# Patient Record
Sex: Female | Born: 1935 | Race: White | Hispanic: No | Marital: Married | Smoking: Never smoker
Health system: Southern US, Community
[De-identification: ages and names within clinical notes are randomized; demographics above are authoritative.]

## PROBLEM LIST (undated history)

## (undated) DIAGNOSIS — M1990 Unspecified osteoarthritis, unspecified site: Secondary | ICD-10-CM

## (undated) DIAGNOSIS — M25561 Pain in right knee: Secondary | ICD-10-CM

## (undated) DIAGNOSIS — G8929 Other chronic pain: Secondary | ICD-10-CM

## (undated) DIAGNOSIS — H3530 Unspecified macular degeneration: Secondary | ICD-10-CM

## (undated) DIAGNOSIS — R269 Unspecified abnormalities of gait and mobility: Secondary | ICD-10-CM

## (undated) DIAGNOSIS — I10 Essential (primary) hypertension: Secondary | ICD-10-CM

## (undated) DIAGNOSIS — M549 Dorsalgia, unspecified: Secondary | ICD-10-CM

## (undated) DIAGNOSIS — E079 Disorder of thyroid, unspecified: Secondary | ICD-10-CM

## (undated) HISTORY — DX: Unspecified osteoarthritis, unspecified site: M19.90

## (undated) HISTORY — PX: COLONOSCOPY: SHX174

## (undated) HISTORY — DX: Unspecified macular degeneration: H35.30

## (undated) HISTORY — PX: SHOULDER SURGERY: SHX246

## (undated) HISTORY — PX: ABDOMINAL HYSTERECTOMY: SHX81

---

## 2001-04-19 ENCOUNTER — Ambulatory Visit (HOSPITAL_COMMUNITY): Admission: RE | Admit: 2001-04-19 | Discharge: 2001-04-19 | Payer: Self-pay | Admitting: Ophthalmology

## 2001-04-19 ENCOUNTER — Encounter: Payer: Self-pay | Admitting: Ophthalmology

## 2001-05-25 ENCOUNTER — Encounter: Payer: Self-pay | Admitting: Emergency Medicine

## 2001-05-25 ENCOUNTER — Emergency Department (HOSPITAL_COMMUNITY): Admission: EM | Admit: 2001-05-25 | Discharge: 2001-05-25 | Payer: Self-pay | Admitting: Emergency Medicine

## 2001-06-22 ENCOUNTER — Encounter (HOSPITAL_COMMUNITY): Admission: RE | Admit: 2001-06-22 | Discharge: 2001-07-22 | Payer: Self-pay | Admitting: Orthopedic Surgery

## 2001-07-23 ENCOUNTER — Encounter (HOSPITAL_COMMUNITY): Admission: RE | Admit: 2001-07-23 | Discharge: 2001-08-22 | Payer: Self-pay | Admitting: Orthopedic Surgery

## 2002-03-30 ENCOUNTER — Ambulatory Visit (HOSPITAL_COMMUNITY): Admission: RE | Admit: 2002-03-30 | Discharge: 2002-03-30 | Payer: Self-pay | Admitting: Gastroenterology

## 2002-03-30 ENCOUNTER — Encounter (INDEPENDENT_AMBULATORY_CARE_PROVIDER_SITE_OTHER): Payer: Self-pay | Admitting: Specialist

## 2002-10-19 ENCOUNTER — Emergency Department (HOSPITAL_COMMUNITY): Admission: EM | Admit: 2002-10-19 | Discharge: 2002-10-19 | Payer: Self-pay | Admitting: *Deleted

## 2002-10-20 ENCOUNTER — Ambulatory Visit (HOSPITAL_COMMUNITY): Admission: RE | Admit: 2002-10-20 | Discharge: 2002-10-20 | Payer: Self-pay | Admitting: *Deleted

## 2002-10-20 ENCOUNTER — Encounter: Payer: Self-pay | Admitting: *Deleted

## 2005-02-12 ENCOUNTER — Ambulatory Visit (HOSPITAL_COMMUNITY): Admission: RE | Admit: 2005-02-12 | Discharge: 2005-02-12 | Payer: Self-pay | Admitting: Ophthalmology

## 2006-08-14 ENCOUNTER — Emergency Department (HOSPITAL_COMMUNITY): Admission: EM | Admit: 2006-08-14 | Discharge: 2006-08-14 | Payer: Self-pay | Admitting: Emergency Medicine

## 2006-11-25 ENCOUNTER — Ambulatory Visit (HOSPITAL_COMMUNITY): Admission: RE | Admit: 2006-11-25 | Discharge: 2006-11-25 | Payer: Self-pay | Admitting: Ophthalmology

## 2010-08-03 NOTE — Op Note (Signed)
NAME:  Cynthia Marks, Cynthia Marks                      ACCOUNT NO.:  [id number]   MEDICAL RECORD NO.:  [id number]                   PATIENT TYPE:  AMB   LOCATION:  ENDO                                 FACILITY:  [facility]   PHYSICIAN:  Petra Kuba, M.D.                 DATE OF BIRTH:  03/06/1933   DATE OF PROCEDURE:  03/30/2002  DATE OF DISCHARGE:                                 OPERATIVE REPORT   PROCEDURE:  Colonoscopy.   INDICATION:  A patient with a history of colon polyps, due for repeat  screening.   Consent was signed after risks, benefits and options, survey discussed in  the past.   MEDICINES USED:  Demerol 120 mg, Versed 12 mg.   DESCRIPTION OF PROCEDURE:  Rectal inspection was pertinent for external  hemorrhoids, small.  Digital exam was negative.  The video pediatric  adjustable colonoscope was inserted with lots of difficulty with a tortuous  sigmoid which required rolling on her back and some abdominal pressure to  advance pass.  Once passed, the instrument was advanced fairly easily to the  cecum.  Other than left and right diverticula, there was no other  abnormality seen.  The cecum was identified by the appendiceal orifice and  the ileocecal valve.  The scope was slowly withdrawn.  The prep was  adequate, there was some liquid stool that required washing and suctioning.  On slow withdrawal, other than the diverticula mentioned above, two tiny  polyps were seen, one in the hepatic flexure hot biopsied and one in the  distal sigmoid cold biopsied and put in separate containers.  No other  abnormalities were seen.  The instrument was withdrawn back to the rectum  and on inspecting the rectum the scope was retroflexed and pertinent for  some internal hemorrhoids.  The scope was straightened and readvanced  towards the left side of the colon, the air was suctioned and the scope  removed.  The patient tolerated the procedure adequately, there were no  obvious or immediate  complications.   ENDOSCOPIC DIAGNOSES:  1. Internal and external hemorrhoids.  2. Left and right diverticula.  3. Tortuous sigmoid.  4. Acute tiny to small polyps, cold biopsied in the distal sigmoid, hot     biopsied in the hepatic flexure.  5. Otherwise within normal limits to the cecum.    PLAN:  Await pathology and probably recheck colon for screening in five  years, happy to see back p.r.n., otherwise return to the care of Drs. Miguel  and Altheimer for customary healthcare maintenance to include yearly rectals  and guaiacs.  In the future, if available, virtual colonoscopy might be a  good option in her based on tortuosity.  Petra Kuba, M.D.    MEM/MEDQ  D:  03/30/2002  T:  03/30/2002  Job:  [id number]   cc:   Veverly Fells. Altheimer, M.D.  [address]  [place]  [place] [address]  Fax: [phone]   Dr. Coletta Memos, [place]

## 2010-12-28 LAB — BASIC METABOLIC PANEL
BUN: 24 — ABNORMAL HIGH
CO2: 26
Calcium: 10.5
Chloride: 108
Creatinine, Ser: 1.05
GFR calc Af Amer: >60
GFR calc non Af Amer: 52 — ABNORMAL LOW
Glucose, Bld: 95
Potassium: 4.5
Sodium: 139

## 2010-12-28 LAB — HEMOGLOBIN AND HEMATOCRIT, BLOOD
HCT: 37.4
Hemoglobin: 12.7

## 2011-04-02 DIAGNOSIS — H35059 Retinal neovascularization, unspecified, unspecified eye: Secondary | ICD-10-CM | POA: Diagnosis not present

## 2011-04-02 DIAGNOSIS — H353290 Exudative age-related macular degeneration, unspecified eye, stage unspecified: Secondary | ICD-10-CM | POA: Diagnosis not present

## 2011-04-03 DIAGNOSIS — E1149 Type 2 diabetes mellitus with other diabetic neurological complication: Secondary | ICD-10-CM | POA: Diagnosis not present

## 2011-04-03 DIAGNOSIS — E119 Type 2 diabetes mellitus without complications: Secondary | ICD-10-CM | POA: Diagnosis not present

## 2011-04-16 DIAGNOSIS — E119 Type 2 diabetes mellitus without complications: Secondary | ICD-10-CM | POA: Diagnosis not present

## 2011-04-16 DIAGNOSIS — E7800 Pure hypercholesterolemia, unspecified: Secondary | ICD-10-CM | POA: Diagnosis not present

## 2011-04-16 DIAGNOSIS — E039 Hypothyroidism, unspecified: Secondary | ICD-10-CM | POA: Diagnosis not present

## 2011-04-16 DIAGNOSIS — E559 Vitamin D deficiency, unspecified: Secondary | ICD-10-CM | POA: Diagnosis not present

## 2011-04-23 DIAGNOSIS — E559 Vitamin D deficiency, unspecified: Secondary | ICD-10-CM | POA: Diagnosis not present

## 2011-04-23 DIAGNOSIS — E7800 Pure hypercholesterolemia, unspecified: Secondary | ICD-10-CM | POA: Diagnosis not present

## 2011-04-23 DIAGNOSIS — E119 Type 2 diabetes mellitus without complications: Secondary | ICD-10-CM | POA: Diagnosis not present

## 2011-04-23 DIAGNOSIS — M949 Disorder of cartilage, unspecified: Secondary | ICD-10-CM | POA: Diagnosis not present

## 2011-04-23 DIAGNOSIS — E213 Hyperparathyroidism, unspecified: Secondary | ICD-10-CM | POA: Diagnosis not present

## 2011-04-23 DIAGNOSIS — I10 Essential (primary) hypertension: Secondary | ICD-10-CM | POA: Diagnosis not present

## 2011-04-23 DIAGNOSIS — E039 Hypothyroidism, unspecified: Secondary | ICD-10-CM | POA: Diagnosis not present

## 2011-06-12 DIAGNOSIS — E119 Type 2 diabetes mellitus without complications: Secondary | ICD-10-CM | POA: Diagnosis not present

## 2011-06-12 DIAGNOSIS — E1149 Type 2 diabetes mellitus with other diabetic neurological complication: Secondary | ICD-10-CM | POA: Diagnosis not present

## 2011-07-09 DIAGNOSIS — H35059 Retinal neovascularization, unspecified, unspecified eye: Secondary | ICD-10-CM | POA: Diagnosis not present

## 2011-07-09 DIAGNOSIS — H353290 Exudative age-related macular degeneration, unspecified eye, stage unspecified: Secondary | ICD-10-CM | POA: Diagnosis not present

## 2011-07-24 DIAGNOSIS — E039 Hypothyroidism, unspecified: Secondary | ICD-10-CM | POA: Diagnosis not present

## 2011-07-24 DIAGNOSIS — E559 Vitamin D deficiency, unspecified: Secondary | ICD-10-CM | POA: Diagnosis not present

## 2011-07-24 DIAGNOSIS — E119 Type 2 diabetes mellitus without complications: Secondary | ICD-10-CM | POA: Diagnosis not present

## 2011-07-24 DIAGNOSIS — E7800 Pure hypercholesterolemia, unspecified: Secondary | ICD-10-CM | POA: Diagnosis not present

## 2011-07-31 DIAGNOSIS — E213 Hyperparathyroidism, unspecified: Secondary | ICD-10-CM | POA: Diagnosis not present

## 2011-07-31 DIAGNOSIS — M899 Disorder of bone, unspecified: Secondary | ICD-10-CM | POA: Diagnosis not present

## 2011-07-31 DIAGNOSIS — I10 Essential (primary) hypertension: Secondary | ICD-10-CM | POA: Diagnosis not present

## 2011-07-31 DIAGNOSIS — E559 Vitamin D deficiency, unspecified: Secondary | ICD-10-CM | POA: Diagnosis not present

## 2011-07-31 DIAGNOSIS — E039 Hypothyroidism, unspecified: Secondary | ICD-10-CM | POA: Diagnosis not present

## 2011-07-31 DIAGNOSIS — E119 Type 2 diabetes mellitus without complications: Secondary | ICD-10-CM | POA: Diagnosis not present

## 2011-07-31 DIAGNOSIS — E7800 Pure hypercholesterolemia, unspecified: Secondary | ICD-10-CM | POA: Diagnosis not present

## 2011-08-28 DIAGNOSIS — E1149 Type 2 diabetes mellitus with other diabetic neurological complication: Secondary | ICD-10-CM | POA: Diagnosis not present

## 2011-08-28 DIAGNOSIS — E119 Type 2 diabetes mellitus without complications: Secondary | ICD-10-CM | POA: Diagnosis not present

## 2011-10-09 DIAGNOSIS — H353290 Exudative age-related macular degeneration, unspecified eye, stage unspecified: Secondary | ICD-10-CM | POA: Diagnosis not present

## 2011-10-09 DIAGNOSIS — H35059 Retinal neovascularization, unspecified, unspecified eye: Secondary | ICD-10-CM | POA: Diagnosis not present

## 2011-10-21 DIAGNOSIS — IMO0002 Reserved for concepts with insufficient information to code with codable children: Secondary | ICD-10-CM | POA: Diagnosis not present

## 2011-10-21 DIAGNOSIS — M25469 Effusion, unspecified knee: Secondary | ICD-10-CM | POA: Diagnosis not present

## 2011-10-28 DIAGNOSIS — S83419A Sprain of medial collateral ligament of unspecified knee, initial encounter: Secondary | ICD-10-CM | POA: Diagnosis not present

## 2011-10-29 DIAGNOSIS — H353190 Nonexudative age-related macular degeneration, unspecified eye, stage unspecified: Secondary | ICD-10-CM | POA: Diagnosis not present

## 2011-10-29 DIAGNOSIS — Z961 Presence of intraocular lens: Secondary | ICD-10-CM | POA: Diagnosis not present

## 2011-11-04 DIAGNOSIS — E039 Hypothyroidism, unspecified: Secondary | ICD-10-CM | POA: Diagnosis not present

## 2011-11-04 DIAGNOSIS — E119 Type 2 diabetes mellitus without complications: Secondary | ICD-10-CM | POA: Diagnosis not present

## 2011-11-04 DIAGNOSIS — E7800 Pure hypercholesterolemia, unspecified: Secondary | ICD-10-CM | POA: Diagnosis not present

## 2011-11-05 DIAGNOSIS — E119 Type 2 diabetes mellitus without complications: Secondary | ICD-10-CM | POA: Diagnosis not present

## 2011-11-05 DIAGNOSIS — I10 Essential (primary) hypertension: Secondary | ICD-10-CM | POA: Diagnosis not present

## 2011-11-05 DIAGNOSIS — E039 Hypothyroidism, unspecified: Secondary | ICD-10-CM | POA: Diagnosis not present

## 2011-11-05 DIAGNOSIS — E559 Vitamin D deficiency, unspecified: Secondary | ICD-10-CM | POA: Diagnosis not present

## 2011-11-05 DIAGNOSIS — M949 Disorder of cartilage, unspecified: Secondary | ICD-10-CM | POA: Diagnosis not present

## 2011-11-05 DIAGNOSIS — E7800 Pure hypercholesterolemia, unspecified: Secondary | ICD-10-CM | POA: Diagnosis not present

## 2011-11-05 DIAGNOSIS — E213 Hyperparathyroidism, unspecified: Secondary | ICD-10-CM | POA: Diagnosis not present

## 2011-11-11 DIAGNOSIS — M25569 Pain in unspecified knee: Secondary | ICD-10-CM | POA: Diagnosis not present

## 2011-11-13 DIAGNOSIS — E1149 Type 2 diabetes mellitus with other diabetic neurological complication: Secondary | ICD-10-CM | POA: Diagnosis not present

## 2011-11-13 DIAGNOSIS — E119 Type 2 diabetes mellitus without complications: Secondary | ICD-10-CM | POA: Diagnosis not present

## 2011-12-04 ENCOUNTER — Emergency Department (HOSPITAL_COMMUNITY)
Admission: EM | Admit: 2011-12-04 | Discharge: 2011-12-04 | Disposition: A | Discharge status: Home / Self Care | Payer: Medicare Other | Attending: Emergency Medicine | Admitting: Emergency Medicine

## 2011-12-04 ENCOUNTER — Emergency Department (HOSPITAL_COMMUNITY): Payer: Medicare Other

## 2011-12-04 ENCOUNTER — Encounter (HOSPITAL_COMMUNITY): Payer: Self-pay | Admitting: *Deleted

## 2011-12-04 DIAGNOSIS — I10 Essential (primary) hypertension: Secondary | ICD-10-CM | POA: Insufficient documentation

## 2011-12-04 DIAGNOSIS — W108XXA Fall (on) (from) other stairs and steps, initial encounter: Secondary | ICD-10-CM | POA: Insufficient documentation

## 2011-12-04 DIAGNOSIS — Y92009 Unspecified place in unspecified non-institutional (private) residence as the place of occurrence of the external cause: Secondary | ICD-10-CM | POA: Insufficient documentation

## 2011-12-04 DIAGNOSIS — E119 Type 2 diabetes mellitus without complications: Secondary | ICD-10-CM | POA: Diagnosis not present

## 2011-12-04 DIAGNOSIS — T1490XA Injury, unspecified, initial encounter: Secondary | ICD-10-CM | POA: Diagnosis not present

## 2011-12-04 DIAGNOSIS — G319 Degenerative disease of nervous system, unspecified: Secondary | ICD-10-CM | POA: Diagnosis not present

## 2011-12-04 DIAGNOSIS — S82843A Displaced bimalleolar fracture of unspecified lower leg, initial encounter for closed fracture: Secondary | ICD-10-CM | POA: Insufficient documentation

## 2011-12-04 DIAGNOSIS — M25569 Pain in unspecified knee: Secondary | ICD-10-CM | POA: Diagnosis not present

## 2011-12-04 DIAGNOSIS — W19XXXA Unspecified fall, initial encounter: Secondary | ICD-10-CM

## 2011-12-04 DIAGNOSIS — Z043 Encounter for examination and observation following other accident: Secondary | ICD-10-CM | POA: Diagnosis not present

## 2011-12-04 DIAGNOSIS — S0990XA Unspecified injury of head, initial encounter: Secondary | ICD-10-CM | POA: Insufficient documentation

## 2011-12-04 HISTORY — DX: Essential (primary) hypertension: I10

## 2011-12-04 HISTORY — DX: Disorder of thyroid, unspecified: E07.9

## 2011-12-04 MED ORDER — OXYCODONE-ACETAMINOPHEN 5-325 MG PO TABS
1.0000 | ORAL_TABLET | Freq: Once | ORAL | Status: AC
  Administered 2011-12-04: 1 via ORAL
  Filled 2011-12-04: qty 1

## 2011-12-04 MED ORDER — OXYCODONE-ACETAMINOPHEN 5-325 MG PO TABS: 1.0000 | ORAL_TABLET | Freq: Four times a day (QID) | ORAL | Status: DC | PRN

## 2011-12-04 NOTE — ED Notes (Signed)
Pt with fall, pt tripped down some steps and now has swelling to right lower leg, old right knee injury over a month which pt has seen her PCP for x 2, pt states that she hit her head on railing, denies LOC, abrasion noted to above right eyebrow

## 2011-12-04 NOTE — ED Provider Notes (Signed)
History  This chart was scribed for Gale Klar B. Bernette Mayers, MD by Shari Heritage. The patient was seen in room APA02/APA02. Patient's care was started at 1438.     CSN: [id number]  Arrival date & time 12/04/11  1356   First MD Initiated Contact with Patient 12/04/11 1438      Chief Complaint  Patient presents with  . Fall  . Leg Pain  . Head Injury    The history is provided by the patient. No language interpreter was used.    Cynthia Marks is a 77 y.o. female who presents to the Emergency Department complaining of a constant, moderate right lower leg and right ankle pain with associated swelling resulting from a fall that occurred 2-3 hours ago. Patient was trying to enter her house when she fell on her front steps. She says she hit her head on the railing. She denies LOC, neck pain or back pain.. Patient states that she has a recent history of knee pain after falling 5-6 weeks ago. She has seen her PCP for this pain 2x and pain has not been resolved. Patient uses a cane. Patient has a medical history of diabetes, thyroid disease and HTN.  PCP - Gerda Diss  Past Medical History  Diagnosis Date  . Diabetes mellitus   . Thyroid disease   . Hypertension     Past Surgical History  Procedure Date  . Abdominal hysterectomy     History reviewed. No pertinent family history.  History  Substance Use Topics  . Smoking status: Never Smoker   . Smokeless tobacco: Not on file  . Alcohol Use: No    OB History    Grav Para Term Preterm Abortions TAB SAB Ect Mult Living                  Review of Systems A complete 10 system review of systems was obtained and all systems are negative except as noted in the HPI and PMH.   Allergies  Ibuprofen and Penicillins  Home Medications  No current outpatient prescriptions on file.  BP 159/73  Pulse 68  Resp 18  SpO2 91%  Physical Exam  Nursing note and vitals reviewed. Constitutional: She is oriented to person, place, and time.  She appears well-developed and well-nourished.  HENT:  Head: Normocephalic.       Forehead abrasion   Eyes: EOM are normal. Pupils are equal, round, and reactive to light.  Neck: Normal range of motion. Neck supple.  Cardiovascular: Normal rate, normal heart sounds and intact distal pulses.   Pulmonary/Chest: Effort normal and breath sounds normal.  Abdominal: Bowel sounds are normal. She exhibits no distension. There is no tenderness.  Musculoskeletal: Normal range of motion. She exhibits edema (swelling of R knee and R ankle) and tenderness (tender to palpation of R knee and both R maleoli).  Neurological: She is alert and oriented to person, place, and time. She has normal strength. No cranial nerve deficit or sensory deficit.  Skin: Skin is warm and dry. No rash noted.  Psychiatric: She has a normal mood and affect.    ED Course  Procedures (including critical care time) DIAGNOSTIC STUDIES: Oxygen Saturation is 91% on room air, low by my interpretation.    COORDINATION OF CARE: 2:46pm- Patient informed of current plan for treatment and evaluation and agrees with plan at this time.    Labs Reviewed - No data to display  Dg Ankle Complete Right  12/04/2011  *RADIOLOGY REPORT*  Clinical  Data: Larey Seat.  Right ankle pain.  RIGHT ANKLE - COMPLETE 3+ VIEW  Comparison: None  Findings: There is a nondisplaced transverse fracture through the medial malleolus at the level of the ankle mortise.  There is also a nondisplaced oblique coursing fracture involving the distal fibula.  The pelvis is intact.  The subtalar joints are maintained. A moderate sized calcaneal heel spur is noted.  Significant surrounding soft tissue swelling noted.  IMPRESSION: Nondisplaced bimalleolar ankle fractures.   Original Report Authenticated By: P. Loralie Champagne, M.D.    Ct Head Wo Contrast  12/04/2011  *RADIOLOGY REPORT*  Clinical Data: Head injury.  Status post fall.  CT HEAD WITHOUT CONTRAST  Technique:   Contiguous axial images were obtained from the base of the skull through the vertex without contrast.  Comparison: None  Findings:  There is diffuse patchy low density throughout the subcortical and periventricular white matter consistent with chronic small vessel ischemic change.  There is prominence of the sulci and ventricles consistent with brain atrophy.  There is no evidence for acute brain infarct, hemorrhage or mass.  The paranasal sinuses are clear.  The mastoid air cells are clear.  The skull is intact.  IMPRESSION:  1.  No acute intracranial abnormalities. 2.  Small vessel ischemic change and brain atrophy.   Original Report Authenticated By: Rosealee Albee, M.D.    Dg Knee Complete 4 Views Right  12/04/2011  *RADIOLOGY REPORT*  Clinical Data: Larey Seat.  RIGHT KNEE - COMPLETE 4+ VIEW  Comparison: None  Findings: The joint spaces are maintained.  No acute fracture or osteochondral abnormality.  No definite joint effusion.  Anterior soft tissue swelling over the knee may be due to direct trauma.  IMPRESSION:  1.  No acute fracture. 2.  Anterior soft tissue swelling/hematoma.   Original Report Authenticated By: P. Loralie Champagne, M.D.      No diagnosis found.    MDM  Pt with bimalleolar ankle fracture, non displaced. Discussed with Dr. Romeo Apple who agrees with plan to place in cam walker. If she is able to get around well enough to be safe at home she will be discharged with outpatient followup. Otherwise plan to admit for physical therapy. Her fracture is non-operative.    6:01 PM Pt able to ambulate with some assistance, but family is still concerned about her ability to get around with a walker. The husband has gone home to get her walker and will return so that we can assess her ability to walk with this device.   Pt wants to go home. Does not want to be admitted. Advised to follow closely with PCP and Ortho.   Jairon Ripberger B. Bernette Mayers, MD 12/04/11 1815

## 2011-12-04 NOTE — ED Notes (Signed)
MD at bedside. 

## 2011-12-06 DIAGNOSIS — S82899A Other fracture of unspecified lower leg, initial encounter for closed fracture: Secondary | ICD-10-CM | POA: Diagnosis not present

## 2011-12-10 ENCOUNTER — Ambulatory Visit (INDEPENDENT_AMBULATORY_CARE_PROVIDER_SITE_OTHER): Payer: Medicare Other | Admitting: Orthopedic Surgery

## 2011-12-10 ENCOUNTER — Encounter: Payer: Self-pay | Admitting: Orthopedic Surgery

## 2011-12-10 VITALS — BP 100/60 | Ht 68.0 in | Wt 275.0 lb

## 2011-12-10 DIAGNOSIS — S82843A Displaced bimalleolar fracture of unspecified lower leg, initial encounter for closed fracture: Secondary | ICD-10-CM | POA: Diagnosis not present

## 2011-12-10 DIAGNOSIS — L918 Other hypertrophic disorders of the skin: Secondary | ICD-10-CM

## 2011-12-10 NOTE — Progress Notes (Signed)
  Subjective:    Patient ID: Cynthia Marks, female    DOB: [birth year], 78 y.o.   MRN: [id number] Referral from Dr. Lilyan Punt  Ankle Injury  The incident occurred 5 to 7 days ago (12/04/2011). The incident occurred at home. The injury mechanism was a fall. The pain is present in the right ankle and right knee. The pain is at a severity of 8/10. The pain has been constant since onset. Pertinent negatives include no inability to bear weight, loss of motion, loss of sensation, muscle weakness, numbness or tingling.      Review of Systems  Musculoskeletal: Positive for gait problem.  Neurological: Positive for weakness. Negative for tingling and numbness.  All other systems reviewed and are negative.       Objective:   Physical Exam  Constitutional: She appears well-developed and well-nourished. No distress.       Obesity  HENT:  Head: Normocephalic.  Eyes: Right eye exhibits no discharge. Left eye exhibits no discharge.  Neck: Neck supple.  Cardiovascular: Normal rate and intact distal pulses.   Musculoskeletal:       The right and left upper extremities have no evidence of malalignment, range of motion is normal without contracture. All joints reduced and stable with normal muscle tone and skin  The patient's left lower extremity is without deformity, range of motion is greater than 120 and knee flexion with a stable knee normal strength and normal skin.  Lymphadenopathy:    She has no cervical adenopathy.  Skin: She is not diaphoretic.       Right lower extremity has an anterior large ulceration 4 cm x 4 cm over the anterior mid portion of the distal leg which is a blister which has opened up and formed hypertrophic granulation tissue. There is no depth to the wound.  There is ecchymosis from the proximal point of two thirds of the leg down into the foot and ankle with medial and lateral malleolar tenderness less than expected suggesting the possibility of peripheral  neuropathy although denied by the patient  There is decreased ankle range of motion actively and 15-20 passively. The ankle is otherwise stable muscle tone is normal in the foot and ankle and leg  Psychiatric: She has a normal mood and affect. Her behavior is normal. Judgment and thought content normal.   BP 100/60  Ht 5\' 8"  (1.727 m)  Wt 275 lb (124.739 kg)  BMI 41.81 kg/m2        Assessment & Plan:  The x-ray shows a bimalleolar fracture of the right ankle nondisplaced  The patient was in a Cam Walker which caused an anterior blister. She was afraid to take the brace off although the pain was noted and the sliding in the boot was noted. She was told in the ER not to take it off and she followed those instructions.  We have dressed the wound. We've ordered wound care daily. We've asked her to come back in a week for x-rays and wound check.  She has been advised that this is a dangerous situation for diabetic and can't turn into a catastrophe.

## 2011-12-10 NOTE — Patient Instructions (Signed)
Please check your skin daily

## 2011-12-11 ENCOUNTER — Ambulatory Visit: Payer: Medicare Other | Admitting: Orthopedic Surgery

## 2011-12-11 DIAGNOSIS — S70919A Unspecified superficial injury of unspecified hip, initial encounter: Secondary | ICD-10-CM | POA: Diagnosis not present

## 2011-12-11 DIAGNOSIS — S8290XD Unspecified fracture of unspecified lower leg, subsequent encounter for closed fracture with routine healing: Secondary | ICD-10-CM | POA: Diagnosis not present

## 2011-12-11 DIAGNOSIS — I10 Essential (primary) hypertension: Secondary | ICD-10-CM | POA: Diagnosis not present

## 2011-12-11 DIAGNOSIS — M1990 Unspecified osteoarthritis, unspecified site: Secondary | ICD-10-CM | POA: Diagnosis not present

## 2011-12-11 DIAGNOSIS — Z9181 History of falling: Secondary | ICD-10-CM | POA: Diagnosis not present

## 2011-12-11 DIAGNOSIS — E119 Type 2 diabetes mellitus without complications: Secondary | ICD-10-CM | POA: Diagnosis not present

## 2011-12-12 ENCOUNTER — Telehealth: Payer: Self-pay | Admitting: *Deleted

## 2011-12-12 NOTE — Telephone Encounter (Signed)
Home health nurse advised

## 2011-12-12 NOTE — Telephone Encounter (Signed)
no

## 2011-12-17 ENCOUNTER — Ambulatory Visit (INDEPENDENT_AMBULATORY_CARE_PROVIDER_SITE_OTHER): Payer: Medicare Other | Admitting: Orthopedic Surgery

## 2011-12-17 ENCOUNTER — Ambulatory Visit (INDEPENDENT_AMBULATORY_CARE_PROVIDER_SITE_OTHER): Payer: Medicare Other

## 2011-12-17 ENCOUNTER — Encounter: Payer: Self-pay | Admitting: Orthopedic Surgery

## 2011-12-17 VITALS — BP 110/60 | Ht 68.0 in | Wt 275.0 lb

## 2011-12-17 DIAGNOSIS — S82891A Other fracture of right lower leg, initial encounter for closed fracture: Secondary | ICD-10-CM

## 2011-12-17 DIAGNOSIS — S82899A Other fracture of unspecified lower leg, initial encounter for closed fracture: Secondary | ICD-10-CM

## 2011-12-17 NOTE — Progress Notes (Signed)
Patient ID: Cynthia Marks, female   DOB: 1933/02/04, 78 y.o.   MRN: [id number] Chief Complaint  Patient presents with  . Follow-up    1 week wound check on right ankle with xray.    Nondisplaced bimalleolar ankle fracture with anterior distal tibial wound, secondary to blister formation.  Currently undergoing daily dressing changes with a home care nurse and comes back today for x-ray of the ankle and wound check.  The wound/blister has improved significantly.  We will continue the dressing changes. They can be changed every other day one Monday, Wednesday, Friday.  As far as the ankle fracture goes the xrays show a little bit of movement in the fracture and these films are better   So limit WB

## 2011-12-17 NOTE — Patient Instructions (Addendum)
Change dressing m, w, f   Limit weight bearing

## 2011-12-18 ENCOUNTER — Telehealth: Payer: Self-pay | Admitting: Orthopedic Surgery

## 2011-12-18 NOTE — Telephone Encounter (Signed)
Home health nurse has been advised and will call patient

## 2011-12-18 NOTE — Telephone Encounter (Signed)
Patient called, states is concerned that [facility] has not been there to see her today, Wed, 12/18/11.  Understood they were notified and to be coming out 3x per week now, Mon, Wed, and Friday.  Please advise - patient ph # [phone]. [facility] ph# is [phone].  She will also probably call them directly, she states, but please advise.

## 2011-12-24 ENCOUNTER — Ambulatory Visit (INDEPENDENT_AMBULATORY_CARE_PROVIDER_SITE_OTHER): Payer: Medicare Other | Admitting: Orthopedic Surgery

## 2011-12-24 ENCOUNTER — Ambulatory Visit: Payer: Medicare Other | Admitting: Orthopedic Surgery

## 2011-12-24 ENCOUNTER — Ambulatory Visit (INDEPENDENT_AMBULATORY_CARE_PROVIDER_SITE_OTHER): Payer: Medicare Other

## 2011-12-24 ENCOUNTER — Encounter: Payer: Self-pay | Admitting: Orthopedic Surgery

## 2011-12-24 VITALS — BP 120/64 | Ht 68.0 in | Wt 275.0 lb

## 2011-12-24 DIAGNOSIS — S82891A Other fracture of right lower leg, initial encounter for closed fracture: Secondary | ICD-10-CM

## 2011-12-24 DIAGNOSIS — S82899A Other fracture of unspecified lower leg, initial encounter for closed fracture: Secondary | ICD-10-CM

## 2011-12-24 NOTE — Patient Instructions (Signed)
Keep weight bearing to a minimum

## 2011-12-24 NOTE — Progress Notes (Signed)
Patient ID: Cynthia Marks, female   DOB: 1932/03/23, 78 y.o.   MRN: [id number] Chief Complaint  Patient presents with  . Follow-up    one week recheck and xray right ankle fracture, DOI 12/04/11    Ankle and wound check  X-rays show no further displacement of the fracture.  The anterior leg wound has epithelialized and is now just a little bit red and there are no signs of infection.  Continued dressing changes minimal weightbearing and x-ray in one week

## 2012-01-01 ENCOUNTER — Encounter: Payer: Self-pay | Admitting: Orthopedic Surgery

## 2012-01-01 ENCOUNTER — Ambulatory Visit (INDEPENDENT_AMBULATORY_CARE_PROVIDER_SITE_OTHER): Payer: Medicare Other

## 2012-01-01 ENCOUNTER — Ambulatory Visit (INDEPENDENT_AMBULATORY_CARE_PROVIDER_SITE_OTHER): Payer: Medicare Other | Admitting: Orthopedic Surgery

## 2012-01-01 VITALS — BP 112/60 | Ht 68.0 in | Wt 275.0 lb

## 2012-01-01 DIAGNOSIS — S82891A Other fracture of right lower leg, initial encounter for closed fracture: Secondary | ICD-10-CM

## 2012-01-01 DIAGNOSIS — S82899A Other fracture of unspecified lower leg, initial encounter for closed fracture: Secondary | ICD-10-CM

## 2012-01-01 NOTE — Patient Instructions (Signed)
Stop home care   Check skin daily   Continue limited weight bearing  Wear brace

## 2012-01-01 NOTE — Progress Notes (Signed)
Patient ID: Cynthia Marks, female   DOB: [birth year], 78 y.o.   MRN: [id number] Chief Complaint  Patient presents with  . Follow-up    recheck and xray right ankle fracture, DOI 12/04/11    Slightly displaced or minimally displaced bimalleolar ankle fracture, and a diabetic, who had an open wound on the tibia, treated with dressing changes and cam walker minimal weightbearing. Repeat x-ray today shows maintenance of the slight displacement.  Recommend continued minimal weightbearing. The wound has healed, so, we can stop the dressing changes continue cam walker. Followup 2 weeks repeat x-ray

## 2012-01-15 ENCOUNTER — Encounter: Payer: Self-pay | Admitting: Orthopedic Surgery

## 2012-01-15 ENCOUNTER — Ambulatory Visit (INDEPENDENT_AMBULATORY_CARE_PROVIDER_SITE_OTHER): Payer: Medicare Other

## 2012-01-15 ENCOUNTER — Ambulatory Visit (INDEPENDENT_AMBULATORY_CARE_PROVIDER_SITE_OTHER): Payer: Medicare Other | Admitting: Orthopedic Surgery

## 2012-01-15 VITALS — BP 120/80 | Ht 68.0 in | Wt 275.0 lb

## 2012-01-15 DIAGNOSIS — S82899A Other fracture of unspecified lower leg, initial encounter for closed fracture: Secondary | ICD-10-CM

## 2012-01-15 NOTE — Progress Notes (Signed)
Patient ID: Cynthia Marks, female   DOB: [birth year], 78 y.o.   MRN: [id number] Chief Complaint  Patient presents with  . Follow-up    follow up and xray right ankle, DOI 12/04/11    This is the 42nd day status post bimalleolar ankle fracture with a anterior tibial wound, which has been treated and is now closed.  X-rays show no change from the last x-ray, which shows displacement of the medial malleolus slight displacement of the lateral malleolus with a 2 mm lateral shift of the mortise,  We've accepted this deformity secondary to the wound and the diabetes.  She is in a cam, walker she's been partial weightbearing, and now can apply more weight on the RIGHT foot. She's been limited in terms of activity. She can now go out of the house apparently, ordered a wheelchair, so she's not on it too much, and she'll come back in 3 weeks for an x-ray of the RIGHT ankle.

## 2012-01-15 NOTE — Patient Instructions (Addendum)
Ok to apply weight on foot as long as brace is on   Remove for sleeping

## 2012-01-22 DIAGNOSIS — E119 Type 2 diabetes mellitus without complications: Secondary | ICD-10-CM | POA: Diagnosis not present

## 2012-01-22 DIAGNOSIS — E1149 Type 2 diabetes mellitus with other diabetic neurological complication: Secondary | ICD-10-CM | POA: Diagnosis not present

## 2012-01-23 DIAGNOSIS — E039 Hypothyroidism, unspecified: Secondary | ICD-10-CM | POA: Diagnosis not present

## 2012-01-23 DIAGNOSIS — E119 Type 2 diabetes mellitus without complications: Secondary | ICD-10-CM | POA: Diagnosis not present

## 2012-01-23 DIAGNOSIS — E785 Hyperlipidemia, unspecified: Secondary | ICD-10-CM | POA: Diagnosis not present

## 2012-01-23 DIAGNOSIS — Z23 Encounter for immunization: Secondary | ICD-10-CM | POA: Diagnosis not present

## 2012-01-23 DIAGNOSIS — I10 Essential (primary) hypertension: Secondary | ICD-10-CM | POA: Diagnosis not present

## 2012-02-05 ENCOUNTER — Ambulatory Visit (INDEPENDENT_AMBULATORY_CARE_PROVIDER_SITE_OTHER): Payer: Medicare Other

## 2012-02-05 ENCOUNTER — Encounter: Payer: Self-pay | Admitting: Orthopedic Surgery

## 2012-02-05 ENCOUNTER — Ambulatory Visit (INDEPENDENT_AMBULATORY_CARE_PROVIDER_SITE_OTHER): Payer: Medicare Other | Admitting: Orthopedic Surgery

## 2012-02-05 VITALS — Ht 68.0 in | Wt 275.0 lb

## 2012-02-05 DIAGNOSIS — S82899A Other fracture of unspecified lower leg, initial encounter for closed fracture: Secondary | ICD-10-CM | POA: Diagnosis not present

## 2012-02-05 DIAGNOSIS — S82843A Displaced bimalleolar fracture of unspecified lower leg, initial encounter for closed fracture: Secondary | ICD-10-CM

## 2012-02-05 NOTE — Patient Instructions (Signed)
Please schedule measurement and fitting AFO  Wear brace until AFO.  Call office once AFO has been received and schedule appointment for one week later

## 2012-02-05 NOTE — Progress Notes (Signed)
Patient ID: Cynthia Marks, female   DOB: 1933/01/03, 78 y.o.   MRN: [id number] Chief Complaint  Patient presents with  . Follow-up    9.18.2013, ankle fracture f/u     1. Ankle fracture  DG Ankle Complete Right, PT orthosis to lower extremity  2. Ankle fracture, bimalleolar, closed      Bimalleolar ankle fracture followup.  Currently in a cam walker.  Repeat x-rays show no subluxation has occurred. No change of position has occurred.  Slight lateral translation of the fibula and ankle mortise with a fibrous union of the medial malleolus.  Recommend AFO brace.  Return one week after brace received 4. X-ray

## 2012-02-06 ENCOUNTER — Telehealth: Payer: Self-pay | Admitting: Orthopedic Surgery

## 2012-02-06 NOTE — Telephone Encounter (Signed)
i already gave her the prescription hand written ??

## 2012-02-06 NOTE — Telephone Encounter (Signed)
Contacted [facility], ph# [phone], and fax# [phone] regarding AFO brace for patient, per office visit 02/05/12, as [facility] does not provide this type of brace.  RX needed for patient to take to [facility] -- they prefer patient to call to schedule appiontment and fitting rather than our office faxing.  Please enter order for AFO brace.  Patient [phone] (Home)

## 2012-02-07 NOTE — Telephone Encounter (Signed)
Patient verified that she did not leave with a prescription or order for the brace, only for walker.   Rx / Order needed for AFO brace for [facility].

## 2012-02-19 ENCOUNTER — Telehealth: Payer: Self-pay | Admitting: Orthopedic Surgery

## 2012-02-19 NOTE — Telephone Encounter (Signed)
Dr. Romeo Apple provided another script - patient was called and will pick up script.

## 2012-02-25 NOTE — Telephone Encounter (Signed)
Patient came in and picked up order for AFO brace, and has an appointment for custom-fitting at [facility] in [place], for 03/02/12, 11:00AM.  Patient is to return for re-check/Xray 1 week following receiving brace.

## 2012-03-16 ENCOUNTER — Ambulatory Visit (INDEPENDENT_AMBULATORY_CARE_PROVIDER_SITE_OTHER): Payer: Medicare Other

## 2012-03-16 ENCOUNTER — Encounter: Payer: Self-pay | Admitting: Orthopedic Surgery

## 2012-03-16 ENCOUNTER — Ambulatory Visit (INDEPENDENT_AMBULATORY_CARE_PROVIDER_SITE_OTHER): Payer: Medicare Other | Admitting: Orthopedic Surgery

## 2012-03-16 VITALS — BP 140/100 | Ht 68.0 in | Wt 275.0 lb

## 2012-03-16 DIAGNOSIS — S82843A Displaced bimalleolar fracture of unspecified lower leg, initial encounter for closed fracture: Secondary | ICD-10-CM | POA: Diagnosis not present

## 2012-03-16 DIAGNOSIS — S82899A Other fracture of unspecified lower leg, initial encounter for closed fracture: Secondary | ICD-10-CM | POA: Diagnosis not present

## 2012-03-16 NOTE — Patient Instructions (Addendum)
Stay in brace until new brace comes   Call the office for appointment 1 week after you get the brace

## 2012-03-16 NOTE — Progress Notes (Signed)
Patient ID: Cynthia Marks, female   DOB: October 21, 1932, 78 y.o.   MRN: [id number] Chief Complaint  Patient presents with  . Follow-up    recheck right ankle//9.18.2013    1. Ankle fracture  DG Ankle Complete Right  2. Ankle fracture, bimalleolar, closed      The AFO that was ordered has not come in yet. She still in a Manufacturing systems engineer.  X-ray was taken today. No significant change. There is some subluxation of the talus in the mortise,.  She's not having any pain. She is having some mild swelling.  Recommend AFO brace to control the ankle.  She will follow up after one week in the brace to check for any skin problems and to check for appropriate fit.

## 2012-03-17 DIAGNOSIS — E039 Hypothyroidism, unspecified: Secondary | ICD-10-CM | POA: Diagnosis not present

## 2012-03-17 DIAGNOSIS — E119 Type 2 diabetes mellitus without complications: Secondary | ICD-10-CM | POA: Diagnosis not present

## 2012-03-17 DIAGNOSIS — Z79899 Other long term (current) drug therapy: Secondary | ICD-10-CM | POA: Diagnosis not present

## 2012-03-17 DIAGNOSIS — E782 Mixed hyperlipidemia: Secondary | ICD-10-CM | POA: Diagnosis not present

## 2012-04-01 DIAGNOSIS — E1149 Type 2 diabetes mellitus with other diabetic neurological complication: Secondary | ICD-10-CM | POA: Diagnosis not present

## 2012-04-01 DIAGNOSIS — E119 Type 2 diabetes mellitus without complications: Secondary | ICD-10-CM | POA: Diagnosis not present

## 2012-04-15 ENCOUNTER — Ambulatory Visit: Payer: Medicare Other | Admitting: Orthopedic Surgery

## 2012-04-22 ENCOUNTER — Ambulatory Visit (INDEPENDENT_AMBULATORY_CARE_PROVIDER_SITE_OTHER): Payer: Medicare Other | Admitting: Orthopedic Surgery

## 2012-04-22 VITALS — BP 122/80 | Ht 68.0 in | Wt 275.0 lb

## 2012-04-22 DIAGNOSIS — S82843A Displaced bimalleolar fracture of unspecified lower leg, initial encounter for closed fracture: Secondary | ICD-10-CM

## 2012-04-22 DIAGNOSIS — I10 Essential (primary) hypertension: Secondary | ICD-10-CM | POA: Diagnosis not present

## 2012-04-22 DIAGNOSIS — R269 Unspecified abnormalities of gait and mobility: Secondary | ICD-10-CM

## 2012-04-22 DIAGNOSIS — E785 Hyperlipidemia, unspecified: Secondary | ICD-10-CM | POA: Diagnosis not present

## 2012-04-22 DIAGNOSIS — E119 Type 2 diabetes mellitus without complications: Secondary | ICD-10-CM | POA: Diagnosis not present

## 2012-04-22 NOTE — Patient Instructions (Addendum)
Please call the hospital to START PHYSICAL THERAPY

## 2012-04-22 NOTE — Progress Notes (Signed)
Patient ID: Cynthia Marks, female   DOB: [birth year], 79 y.o.   MRN: [id number] Chief Complaint  Patient presents with  . Follow-up    Recheck right ankle fracture after brace DOI 12/04/11    Sent for an ankle brace secondary to her diabetic peripheral neuropathy and bimalleolar ankle fracture with slight subluxation of the mortise  She is in a double upright brace she says her ankle feels great she's tolerating it well without any skin problems  Recommend physical therapy to try to transition her from a walker to a cane  Followup with [place] as needed therapy orders have been initiated

## 2012-04-29 ENCOUNTER — Ambulatory Visit (HOSPITAL_COMMUNITY)
Admission: RE | Admit: 2012-04-29 | Discharge: 2012-04-29 | Disposition: A | Discharge status: Home / Self Care | Payer: Medicare Other | Source: Ambulatory Visit | Attending: Orthopedic Surgery | Admitting: Orthopedic Surgery

## 2012-04-29 DIAGNOSIS — IMO0001 Reserved for inherently not codable concepts without codable children: Secondary | ICD-10-CM | POA: Diagnosis not present

## 2012-04-29 DIAGNOSIS — M6281 Muscle weakness (generalized): Secondary | ICD-10-CM | POA: Insufficient documentation

## 2012-04-29 DIAGNOSIS — R269 Unspecified abnormalities of gait and mobility: Secondary | ICD-10-CM | POA: Diagnosis not present

## 2012-04-29 DIAGNOSIS — M25579 Pain in unspecified ankle and joints of unspecified foot: Secondary | ICD-10-CM | POA: Insufficient documentation

## 2012-04-29 NOTE — Evaluation (Signed)
Physical Therapy Evaluation  Patient Details  Name: ELYN KROGH MRN: [id number] Date of Birth: 1932-12-02  Today's Date: 04/29/2012 Time: 1111-1200 PT Time Calculation (min): 49 min Charges: 1 eval Visit#: 1 of 8  Re-eval: 05/29/12 Assessment Diagnosis: R bimallelar fracture  Next MD Visit: Dr. Romeo Apple - unschedueled Prior Therapy: HHPT  Authorization: Medicare  Authorization Time Period:    Authorization Visit#: 1 of 10   Past Medical History:  Past Medical History  Diagnosis Date  . Diabetes mellitus   . Thyroid disease   . Hypertension    Past Surgical History:  Past Surgical History  Procedure Laterality Date  . Abdominal hysterectomy      Subjective Symptoms/Limitations Pertinent History: Pt is referred to PT for gait training from a bimalleolar fracture on 12/04/11.  She wore a CAM boot for 16 weeks and now presents with a double upright AFO, which was ordered through [employer].  She did not undergo surgery, she was was NWB for 12 weeks and was hoping with her walker.  She is now using a rollator for community mobility.  She wishes to be independent walking again.  Her c/co is pain to her R knee which hurts when she walks (5/10).  stifness and swelling to her ankle.  How long can you sit comfortably?: difficulty going from STS. How long can you stand comfortably?: 10 minutes How long can you walk comfortably?: w/RW 15-20 minutes Pain Assessment Currently in Pain?: Yes Pain Score:   5 Pain Location: Ankle (bimalleloar) Pain Type: Chronic pain Pain Relieving Factors: Tyelonal arthritis medication, ice, tramadol as needed Effect of Pain on Daily Activities: difficulty getting around community.   Prior Functioning  Home Living Lives With: Spouse  Cognition/Observation Observation/Other Assessments Observations: significant swelling to R ankle.   Sensation/Coordination/Flexibility/Functional Tests Functional Tests Functional Tests: 5 STS: 28 sec (from  lowered mat surface, 17.5 cm) Functional Tests: ABC: 47%  Assessment RLE AROM (degrees) RLE Overall AROM Comments: soleus 2 Right Ankle Dorsiflexion: -12 Right Ankle Plantar Flexion: 40 Right Ankle Inversion: 20 Right Ankle Eversion: 30 RLE PROM (degrees) Right Ankle Dorsiflexion: 0 Right Ankle Plantar Flexion: 40 Right Ankle Inversion: 28 Right Ankle Eversion: 30 RLE Strength Right Hip Flexion: 3-/5 Right Hip Extension: 2+/5 (L S/L) Right Hip ABduction: 2+/5 Right Knee Flexion: 4/5 Right Knee Extension: 3+/5 Right Ankle Dorsiflexion: 5/5 Right Ankle Plantar Flexion: 2+/5 Right Ankle Inversion: 3-/5 Right Ankle Eversion: 3-/5  Mobility/Balance  Ambulation/Gait Ambulation/Gait: Yes Ambulation Distance (Feet): 153 Feet Assistive device: Rollator Gait Pattern: Decreased stance time - right;Decreased hip/knee flexion - right (R toe out) Static Standing Balance Single Leg Stance - Right Leg: 0 Single Leg Stance - Left Leg: 0 Tandem Stance - Right Leg: 0 Tandem Stance - Left Leg: 0 Rhomberg - Eyes Opened: 10 Rhomberg - Eyes Closed: 10   Exercise/Treatments Supine Bridges: 5 reps Straight Leg Raises: Right;5 reps Sidelying Hip ABduction: Right;5 reps;AAROM  Physical Therapy Assessment and Plan PT Assessment and Plan Clinical Impression Statement: Pt is a 79 year old female referred to PT for bimalleloar fracture in September.  She comes in today with bilateral upright AFO to R ankle and has significant gait deviations likely causing knee pain and LE deviations. Pt will benefit from skilled therapeutic intervention in order to improve on the following deficits: Decreased balance;Decreased range of motion;Decreased strength;Difficulty walking;Pain;Increased edema;Impaired perceived functional ability Rehab Potential: Good PT Frequency: Min 2X/week PT Duration: 12 weeks PT Treatment/Interventions: Gait training;Stair training;Therapeutic activities;Therapeutic  exercise;Balance training;Neuromuscular re-education;Patient/family education;Manual  techniques;DME instruction PT Plan: Focus on proper gait mechanics, improving ankle strength (w/green t-band)    Goals Home Exercise Program Pt will Perform Home Exercise Program: Independently PT Goal: Perform Home Exercise Program - Progress: Goal set today PT Short Term Goals Time to Complete Short Term Goals: 4 weeks PT Short Term Goal 1: Pt will improve LE strength by 1 muscle grade to ambulate indoors w/SPC w/moderate gait abnormalities.  PT Short Term Goal 2: Pt will report pain to knee less than 3/10 for improved QOL.   PT Short Term Goal 3: Pt will improve static standing balance and demo tandem stance x10 sec on solid surface.  PT Short Term Goal 4: Pt will improve ankle AROM to [facility] in order to go from STS with appropriate mechanics.  PT Long Term Goals Time to Complete Long Term Goals: 12 weeks PT Long Term Goal 1: Pt will improve LE strength in order to ambulate independently in indoor and outdoor environment to improve QOL.  PT Long Term Goal 2: Pt will improve her ABC to 75% for improved percieved functional ability.  Long Term Goal 3: Pt will improve 2 minute walk test independently and complete 300 feet for improved safety with community ambulation.   Problem List Patient Active Problem List  Diagnosis  . Ankle fracture, bimalleolar, closed  . Skin breakdown  . Gait abnormality    PT Plan of Care PT Home Exercise Plan: see scanned report (green t-band given) PT Patient Instructions: importance of HEP Consulted and Agree with Plan of Care: Patient  GP Functional Assessment Tool Used: ABC: 47% Functional Limitation: Mobility: Walking and moving around Mobility: Walking and Moving Around Current Status ([id number]): At least 60 percent but less than 80 percent impaired, limited or restricted Mobility: Walking and Moving Around Goal Status [phone]): At least 20 percent but less than 40  percent impaired, limited or restricted  Kairyn Olmeda, PT 04/29/2012, 12:23 PM  Physician Documentation Your signature is required to indicate approval of the treatment plan as stated above.  Please sign and either send electronically or make a copy of this report for your files and return this physician signed original.   Please mark one 1.__approve of plan  2. ___approve of plan with the following conditions.   ______________________________                                                          _____________________ Physician Signature                                                                                                             Date

## 2012-05-05 ENCOUNTER — Ambulatory Visit (HOSPITAL_COMMUNITY)
Admission: RE | Admit: 2012-05-05 | Discharge: 2012-05-05 | Disposition: A | Discharge status: Home / Self Care | Payer: Medicare Other | Source: Ambulatory Visit | Attending: Orthopedic Surgery | Admitting: Orthopedic Surgery

## 2012-05-05 DIAGNOSIS — R269 Unspecified abnormalities of gait and mobility: Secondary | ICD-10-CM | POA: Diagnosis not present

## 2012-05-05 DIAGNOSIS — IMO0001 Reserved for inherently not codable concepts without codable children: Secondary | ICD-10-CM | POA: Diagnosis not present

## 2012-05-05 DIAGNOSIS — M6281 Muscle weakness (generalized): Secondary | ICD-10-CM | POA: Diagnosis not present

## 2012-05-05 DIAGNOSIS — M25579 Pain in unspecified ankle and joints of unspecified foot: Secondary | ICD-10-CM | POA: Diagnosis not present

## 2012-05-05 NOTE — Progress Notes (Signed)
Physical Therapy Treatment Patient Details  Name: Cynthia Marks MRN: [id number] Date of Birth: 1932-07-27  Today's Date: 05/05/2012 Time: 0932-1013 PT Time Calculation (min): 41 min  Visit#: 2 of 8  Re-eval: 05/29/12  Charge: Gait 10', therex 30'  Authorization: Medicare  Authorization Time Period:    Authorization Visit#: 2 of 10   Subjective: Symptoms/Limitations Symptoms: Pt stated compliance with HEP.  Pain free today. Pain Assessment Currently in Pain?: No/denies  Objective:   Exercise/Treatments Standing Heel Raises: 10 reps;Limitations Heel Raises Limitations: toe raises 10  Functional Squat: 10 reps Gait Training: Gait training w/ rollator SBA 436ft with cueing to normalize gait mechanics Other Standing Knee Exercises: hip ext/ abd 10x Seated Other Seated Knee Exercises: Hell roll out 10x 10' Other Seated Knee Exercises: 5STS w/ no HHA Supine Bridges: 10 reps Straight Leg Raises: 10 reps Other Supine Knee Exercises: ankle green theraband  10x 5" each direction- pt given HEP worksheet for HEP Sidelying Hip ABduction: 10 reps (cueing for form and current mm activation)  Physical Therapy Assessment and Plan PT Assessment and Plan Clinical Impression Statement: Improved gait mechanics following training for heel to toe pattern and equal stride length with rollator this session.  Pt instructed exercises for ankle strengthening all directions with theraband, pt given HEP worksheet for proper technqiue to continue strengthening at home. Added therex exercises for BLE strenghtening PT Plan: Focus on proper gait mechanics without AD next session.  Improve ankle and LE strengthening.    Goals    Problem List Patient Active Problem List  Diagnosis  . Ankle fracture, bimalleolar, closed  . Skin breakdown  . Gait abnormality    PT - End of Session Activity Tolerance: Patient tolerated treatment well General Behavior During Session: [facility] for tasks  performed Cognition: [facility] for tasks performed  GP    Juel Burrow 05/05/2012, 12:09 PM

## 2012-05-07 ENCOUNTER — Ambulatory Visit (HOSPITAL_COMMUNITY)
Admission: RE | Admit: 2012-05-07 | Discharge: 2012-05-07 | Disposition: A | Discharge status: Home / Self Care | Payer: Medicare Other | Source: Ambulatory Visit | Attending: Orthopedic Surgery | Admitting: Orthopedic Surgery

## 2012-05-07 DIAGNOSIS — R269 Unspecified abnormalities of gait and mobility: Secondary | ICD-10-CM | POA: Diagnosis not present

## 2012-05-07 DIAGNOSIS — IMO0001 Reserved for inherently not codable concepts without codable children: Secondary | ICD-10-CM | POA: Diagnosis not present

## 2012-05-07 DIAGNOSIS — M25579 Pain in unspecified ankle and joints of unspecified foot: Secondary | ICD-10-CM | POA: Diagnosis not present

## 2012-05-07 NOTE — Progress Notes (Signed)
Physical Therapy Treatment Patient Details  Name: Cynthia Marks MRN: [id number] Date of Birth: December 19, 1932  Today's Date: 05/07/2012 Time: 4098-1191 PT Time Calculation (min): 50 min  Visit#: 3 of 8  Re-eval: 05/29/12 Authorization: Medicare  Authorization Visit#: 3 of 10  Charges:  therex 44'  Subjective: Symptoms/Limitations Symptoms: PT. reports no pain today. Pain Assessment Currently in Pain?: No/denies   Exercise/Treatments Stretches ITB Stretch: 3 reps;30 seconds;Limitations ITB Stretch Limitations: PROM in supine R LE Standing Heel Raises: 15 reps Functional Squat: 15 reps Gait Training: Gait training 300' without AD and CGA with cueing to normalize gait mechanics Other Standing Knee Exercises: hip ext/ abd 15x Seated Other Seated Knee Exercises: towel inv/ever 5 reps R LE Other Seated Knee Exercises: 5STS w/ no HHA Supine Hip Adduction Isometric: 10 reps Bridges: 10 reps Straight Leg Raises: 10 reps Other Supine Knee Exercises: ankle green theraband  10x 5" each direction- pt given HEP worksheet for HEP Sidelying Hip ABduction: 10 reps     Physical Therapy Assessment and Plan PT Assessment and Plan Clinical Impression Statement: Pt. able to ambulate without AD without LOB or difficulty.  Noted slower gait speed, however little cues for stride length and gait pattern.  Added passive ITB stretch in supine due to tight hip rotators and iso hip adduction to strengthen hip adductors.  Pt able to complete session without difficulty.  Encouraged pt. to ambulate without AD more at home. PT Plan: Continue to work toward goals.  Progress balance activites (SLS) and steps.     Problem List Patient Active Problem List  Diagnosis  . Ankle fracture, bimalleolar, closed  . Skin breakdown  . Gait abnormality    PT - End of Session Activity Tolerance: Patient tolerated treatment well General Behavior During Session: [facility] for tasks performed Cognition: [facility] for  tasks performed   Lurena Nida, PTA/CLT 05/07/2012, 12:15 PM

## 2012-05-12 ENCOUNTER — Ambulatory Visit (HOSPITAL_COMMUNITY)
Admission: RE | Admit: 2012-05-12 | Discharge: 2012-05-12 | Disposition: A | Discharge status: Home / Self Care | Payer: Medicare Other | Source: Ambulatory Visit | Attending: Orthopedic Surgery | Admitting: Orthopedic Surgery

## 2012-05-12 DIAGNOSIS — R269 Unspecified abnormalities of gait and mobility: Secondary | ICD-10-CM | POA: Diagnosis not present

## 2012-05-12 DIAGNOSIS — M6281 Muscle weakness (generalized): Secondary | ICD-10-CM | POA: Diagnosis not present

## 2012-05-12 DIAGNOSIS — M25579 Pain in unspecified ankle and joints of unspecified foot: Secondary | ICD-10-CM | POA: Diagnosis not present

## 2012-05-12 DIAGNOSIS — IMO0001 Reserved for inherently not codable concepts without codable children: Secondary | ICD-10-CM | POA: Diagnosis not present

## 2012-05-12 NOTE — Progress Notes (Signed)
Physical Therapy Treatment Patient Details  Name: Cynthia Marks MRN: [id number] Date of Birth: [birth year]  Today's Date: 05/12/2012 Time: 1102-1200 PT Time Calculation (min): 58 min Charge: Gait 8', NMR 12', therex 28', ice 10x   Visit#: 4 of 8  Re-eval: 05/29/12 Assessment Diagnosis: R bimallelar fracture  Next MD Visit: Dr. Romeo Apple - unschedueled Prior Therapy: HHPT  Authorization: Medicare  Authorization Time Period:    Authorization Visit#: 4 of 10   Subjective: Symptoms/Limitations Symptoms: Pt reports pain free today Pain Assessment Currently in Pain?: No/denies  Objective:   Exercise/Treatments Standing Heel Raises: 15 reps Lateral Step Up: Right;5 reps;Hand Hold: 1;Step Height: 2";Step Height: 4" Forward Step Up: Right;10 reps;Hand Hold: 1;Step Height: 4" Functional Squat: 15 reps Rocker Board: 2 minutes;Limitations Rocker Board Limitations: R/L w/ HHA SLS: L 13" max of 3, R 3x 30" Gait Training: Gait training 300' without AD and CGA with cueing to normalize gait mechanics Other Standing Knee Exercises: side stepping 1RT Other Standing Knee Exercises: hip ext/ abd 15x Seated Other Seated Knee Exercises: towel inv/ever 5 reps R LE; Heel roll out 5x 10" Other Seated Knee Exercises: 5STS w/ no HHA Supine Bridges: 15 reps Other Supine Knee Exercises: ankle green theraband  10x 5" each direction- pt given HEP worksheet for HEP Sidelying Hip ABduction: 15 reps   Modalities Modalities: Cryotherapy Cryotherapy Number Minutes Cryotherapy: 10 Minutes Cryotherapy Location: Ankle Type of Cryotherapy: Ice pack  Physical Therapy Assessment and Plan PT Assessment and Plan Clinical Impression Statement: Added rockerboard to improve weight distribution wih improved gait mechanics with no LOB however reduced cadence speed without AD and required min cueing for posture and to increase stride length.  Progressed balance training and began forward and lateral step  up, pt able to demonstrate appropriately with min cueing to require weight bearing through UE and reduce hip hiking for LE strengthening.  Pt stated min increase in pain to lateral R ankle, ice applied end of session with pain reduced.   PT Plan: Continue to work toward goals.  Progress balance activites (SLS) and steps.    Goals    Problem List Patient Active Problem List  Diagnosis  . Ankle fracture, bimalleolar, closed  . Skin breakdown  . Gait abnormality    PT - End of Session Equipment Utilized During Treatment: Gait belt Activity Tolerance: Patient tolerated treatment well General Behavior During Session: [facility] for tasks performed Cognition: [facility] for tasks performed  GP    Juel Burrow 05/12/2012, 12:06 PM

## 2012-05-14 ENCOUNTER — Ambulatory Visit (HOSPITAL_COMMUNITY)
Admission: RE | Admit: 2012-05-14 | Discharge: 2012-05-14 | Disposition: A | Discharge status: Home / Self Care | Payer: Medicare Other | Source: Ambulatory Visit | Attending: Orthopedic Surgery | Admitting: Orthopedic Surgery

## 2012-05-14 DIAGNOSIS — IMO0001 Reserved for inherently not codable concepts without codable children: Secondary | ICD-10-CM | POA: Diagnosis not present

## 2012-05-14 DIAGNOSIS — M6281 Muscle weakness (generalized): Secondary | ICD-10-CM | POA: Diagnosis not present

## 2012-05-14 DIAGNOSIS — R269 Unspecified abnormalities of gait and mobility: Secondary | ICD-10-CM | POA: Diagnosis not present

## 2012-05-14 DIAGNOSIS — M25579 Pain in unspecified ankle and joints of unspecified foot: Secondary | ICD-10-CM | POA: Diagnosis not present

## 2012-05-14 NOTE — Progress Notes (Signed)
Physical Therapy Treatment Patient Details  Name: Cynthia Marks MRN: [id number] Date of Birth: 07/01/1932  Today's Date: 05/14/2012 Time: 4098-1191 PT Time Calculation (min): 53 min Charges: 25' Gait, 23' NMR Visit#: 5 of 8  Re-eval: 05/29/12   Authorization: Medicare  Authorization Time Period:    Authorization Visit#: 5 of 10   Subjective: Symptoms/Limitations Symptoms: Pt reports that she is very sore today for the exercises yesterday.  Pain Assessment Currently in Pain?: Yes Pain Score:   3 Pain Location: Ankle Pain Orientation: Right  Exercise/Treatments Standing Heel Raises: 10 reps Heel Raises Limitations: no HHA. Toe Raises 10 reps no HHA Lateral Step Up: Both;10 reps;Hand Hold: 2;Step Height: 2" Forward Step Up: Both;10 reps;Hand Hold: 2;Step Height: 2" Rocker Board: 2 minutes;Limitations Rocker Board Limitations: R/L w/ HHA SLS: Tandem stance 30 sec BLE. L foot on 6 in step 3x30 sec; R SLS 3x15 sec holds Gait Training: x500 ft w/o AD x1200 ft w/SPC throughout hallways x15 minutes; standing balance w/o UE A x3  minutes, weaving in hallway, stepping over objects in hallway.  Outdoor ambulation x12 minutes, sidewalk, ramp, cobblestone. cueing for posture and eye gaze  Physical Therapy Assessment and Plan PT Assessment and Plan Clinical Impression Statement: Added indoor and outdoor ambulation w/o AD and w/SPC to improve confidence with gait activities.  She continues to have significant weakness to BLE which is llikely the greatest cause of her difficulty with balance.   PT Plan: Continue to work toward goals.  Progress balance activites (SLS) and steps.    Goals Home Exercise Program Pt will Perform Home Exercise Program: Independently PT Short Term Goals Time to Complete Short Term Goals: 4 weeks PT Short Term Goal 1: Pt will improve LE strength by 1 muscle grade to ambulate indoors w/SPC w/moderate gait abnormalities.  PT Short Term Goal 1 - Progress:  Progressing toward goal PT Short Term Goal 2: Pt will report pain to knee less than 3/10 for improved QOL.   PT Short Term Goal 2 - Progress: Met PT Short Term Goal 3: Pt will improve static standing balance and demo tandem stance x10 sec on solid surface.  PT Short Term Goal 3 - Progress: Met PT Short Term Goal 4: Pt will improve ankle AROM to [facility] in order to go from STS with appropriate mechanics.  PT Short Term Goal 4 - Progress: Progressing toward goal PT Long Term Goals Time to Complete Long Term Goals: 12 weeks PT Long Term Goal 1: Pt will improve LE strength in order to ambulate independently in indoor and outdoor environment to improve QOL.  PT Long Term Goal 1 - Progress: Progressing toward goal PT Long Term Goal 2: Pt will improve her ABC to 75% for improved percieved functional ability.  PT Long Term Goal 2 - Progress: Progressing toward goal Long Term Goal 3: Pt will improve 2 minute walk test independently and complete 300 feet for improved safety with community ambulation.  Long Term Goal 3 Progress: Progressing toward goal  Problem List Patient Active Problem List  Diagnosis  . Ankle fracture, bimalleolar, closed  . Skin breakdown  . Gait abnormality    PT - End of Session Equipment Utilized During Treatment: Gait belt Activity Tolerance: Patient tolerated treatment well General Behavior During Session: [facility] for tasks performed Cognition: [facility] for tasks performed  Djimon Lundstrom, PT 05/14/2012, 12:13 PM

## 2012-05-19 ENCOUNTER — Inpatient Hospital Stay (HOSPITAL_COMMUNITY): Admission: RE | Admit: 2012-05-19 | Payer: Medicare Other | Source: Ambulatory Visit | Admitting: Physical Therapy

## 2012-05-21 ENCOUNTER — Ambulatory Visit (HOSPITAL_COMMUNITY)
Admission: RE | Admit: 2012-05-21 | Discharge: 2012-05-21 | Disposition: A | Discharge status: Home / Self Care | Payer: Medicare Other | Source: Ambulatory Visit | Attending: Orthopedic Surgery | Admitting: Orthopedic Surgery

## 2012-05-21 DIAGNOSIS — M6281 Muscle weakness (generalized): Secondary | ICD-10-CM | POA: Insufficient documentation

## 2012-05-21 DIAGNOSIS — M25579 Pain in unspecified ankle and joints of unspecified foot: Secondary | ICD-10-CM | POA: Diagnosis not present

## 2012-05-21 DIAGNOSIS — R269 Unspecified abnormalities of gait and mobility: Secondary | ICD-10-CM | POA: Diagnosis not present

## 2012-05-21 DIAGNOSIS — IMO0001 Reserved for inherently not codable concepts without codable children: Secondary | ICD-10-CM | POA: Diagnosis not present

## 2012-05-21 NOTE — Progress Notes (Signed)
Physical Therapy Treatment Patient Details  Name: Cynthia Marks MRN: [id number] Date of Birth: [birth year]  Today's Date: 05/21/2012 Time: 1102-1142 PT Time Calculation (min): 40 min  Visit#: 6 of 8  Re-eval: 05/29/12 Charges: Gait x 10' Therex x 28'   Authorization: Medicare  Authorization Visit#: 6 of 8   Subjective: Symptoms/Limitations Symptoms: Pt states that she's been doing her exercises every other day because she has been sore. Pain Assessment Currently in Pain?: No/denies   Exercise/Treatments Standing Heel Raises: 10 reps Heel Raises Limitations: no HHA. Toe Raises 10 reps no HHA Lateral Step Up: Both;10 reps;Hand Hold: 2;Step Height: 2" Forward Step Up: Both;10 reps;Hand Hold: 2;Step Height: 2" Functional Squat: 15 reps Rocker Board: 2 minutes;Limitations Rocker Board Limitations: R/L A/P w/ HHA Gait Training: 10' w/o AD around dept with cueing for heel to toe pattern and increased stirde length Other Standing Knee Exercises: hip ext/ abd 15x Seated Other Seated Knee Exercises: Hip roll in/out 5x 10" Other Seated Knee Exercises: 5STS w/ no HHA  Physical Therapy Assessment and Plan PT Assessment and Plan Clinical Impression Statement: Pt displays improved activity tolerance this session. Pt tolerated gait training without AD for 10' before needing to sit down. Pt requires multimodal cueing with fucntional squats and standing hip abd/ext for proper form.  PT Plan: Continue to work toward goals.  Progress balance activites (SLS) and steps.     Problem List Patient Active Problem List  Diagnosis  . Ankle fracture, bimalleolar, closed  . Skin breakdown  . Gait abnormality    PT - End of Session Equipment Utilized During Treatment: Gait belt Activity Tolerance: Patient tolerated treatment well General Behavior During Session: [facility] for tasks performed Cognition: [facility] for tasks performed  Seth Bake, PTA  05/21/2012, 11:54 AM

## 2012-05-26 ENCOUNTER — Ambulatory Visit (HOSPITAL_COMMUNITY)
Admission: RE | Admit: 2012-05-26 | Discharge: 2012-05-26 | Disposition: A | Discharge status: Home / Self Care | Payer: Medicare Other | Source: Ambulatory Visit | Attending: Orthopedic Surgery | Admitting: Orthopedic Surgery

## 2012-05-26 DIAGNOSIS — IMO0001 Reserved for inherently not codable concepts without codable children: Secondary | ICD-10-CM | POA: Diagnosis not present

## 2012-05-26 DIAGNOSIS — R269 Unspecified abnormalities of gait and mobility: Secondary | ICD-10-CM | POA: Diagnosis not present

## 2012-05-26 DIAGNOSIS — M25579 Pain in unspecified ankle and joints of unspecified foot: Secondary | ICD-10-CM | POA: Diagnosis not present

## 2012-05-26 DIAGNOSIS — M6281 Muscle weakness (generalized): Secondary | ICD-10-CM | POA: Diagnosis not present

## 2012-05-26 NOTE — Progress Notes (Signed)
Physical Therapy Treatment Patient Details  Name: Cynthia Marks MRN: [id number] Date of Birth: 13-Jun-1932  Today's Date: 05/26/2012 Time: 3664-4034 PT Time Calculation (min): 45 min Charge: Gait 10', therex 32'  Visit#: 7 of 8  Re-eval: 05/29/12 Assessment Diagnosis: R bimallelar fracture  Next MD Visit: Dr. Romeo Apple - unschedueled Prior Therapy: HHPT  Authorization: Medicare  Authorization Time Period:    Authorization Visit#: 7 of 8   Subjective: Symptoms/Limitations Symptoms: Pt stated she was pain free today, has been walking more around the house.  Pt has been walking around house with no AD and cooking/washing dishes, get tired  Pain Assessment Currently in Pain?: No/denies  Precautions/Restrictions  Precautions Precautions: None  Exercise/Treatments Aerobic Tread Mill: .7--.1.0 mph x 6 min for improved gait mechanics and confidence Standing Heel Raises: 15 reps Heel Raises Limitations: no HHA. Toe Raises 10 reps no HHA Lateral Step Up: Both;10 reps;Hand Hold: 2;Step Height: 2" Forward Step Up: Right;10 reps;Hand Hold: 1;Step Height: 4" Functional Squat: 15 reps Rocker Board: 2 minutes;Limitations Rocker Board Limitations: R/L A/P w/ HHA SLS:     Gait Training: 250' w/ cueing to  Other Standing Knee Exercises: tandem stance 4x 30" Seated Other Seated Knee Exercises: 5STS w/ no HHA     Physical Therapy Assessment and Plan PT Assessment and Plan Clinical Impression Statement: Progressed to TM for improved gait mechanics and activity tolerance and to increase confidence ambulating with no AD, pt. required min vc-ing for posture and to increase stride length.  Limited by fatigue with activity.  Pt continues to require multimodal cueing for proper form with functional squats.   PT Plan: Continue with current POC progressing towards goals.  Progress gait, balance activities and steps.    Goals    Problem List Patient Active Problem List  Diagnosis  .  Ankle fracture, bimalleolar, closed  . Skin breakdown  . Gait abnormality    PT - End of Session Equipment Utilized During Treatment: Gait belt Activity Tolerance: Patient tolerated treatment well General Behavior During Session: [facility] for tasks performed Cognition: [facility] for tasks performed  GP    Juel Burrow 05/26/2012, 4:23 PM

## 2012-05-28 ENCOUNTER — Ambulatory Visit (HOSPITAL_COMMUNITY)
Admission: RE | Admit: 2012-05-28 | Discharge: 2012-05-28 | Disposition: A | Discharge status: Home / Self Care | Payer: Medicare Other | Source: Ambulatory Visit | Attending: Orthopedic Surgery | Admitting: Orthopedic Surgery

## 2012-05-28 DIAGNOSIS — M6281 Muscle weakness (generalized): Secondary | ICD-10-CM | POA: Diagnosis not present

## 2012-05-28 DIAGNOSIS — M25579 Pain in unspecified ankle and joints of unspecified foot: Secondary | ICD-10-CM | POA: Diagnosis not present

## 2012-05-28 DIAGNOSIS — R269 Unspecified abnormalities of gait and mobility: Secondary | ICD-10-CM | POA: Diagnosis not present

## 2012-05-28 NOTE — Evaluation (Signed)
Physical Therapy Re-Evaluation  Patient Details  Name: Cynthia Marks MRN: [id number] Date of Birth: 1932-09-23  Today's Date: 05/28/2012 Time: 0930-1015 PT Time Calculation (min): 45 min Charges: 1 ROM, 1 MMT, 15' TE, 20' Self Care             Visit#: 8 of 16  Re-eval: 06/27/12 Assessment Diagnosis: R bimallelar fracture  Next MD Visit: Dr. Romeo Apple - unschedueled  Authorization: Medicare    Authorization Time Period:    Authorization Visit#: 8 of 16    Subjective   Symptoms: Pt reports that she does not feel that she needs her AFO any longer.  She feels she is becoming more comfortable with walking around her house independently, however continues to require a rollator for outdoor use. Reports that most of her pain is from her R knee (5/10) and does not have much pain in her ankle.  She would like to become stronger and wonders if her crestor could be causing her weakness to her legs.  Pain: Yes Pain Score: 2 Location: Ankle, Right  Sensation/Coordination/Flexibility/Functional Tests Functional Tests Functional Tests: 5 STS: 18 sec (from lowered mat surface, 17.5 cm) (28 sec (from lowered mat surface, 17.5 cm)) Functional Tests: ABC: % (was 47%)  RLE AROM (degrees) RLE Overall AROM Comments: solues 5 degrees Right Ankle Dorsiflexion: 0 (was -12) Right Ankle Plantar Flexion: 40 Right Ankle Inversion: 30 (was 20) Right Ankle Eversion: 32 (was 30)  RLE Strength Right Hip Flexion: 3/5 (was 3-/5) Right Hip Extension: 3/5 (was 2+/5) Right Hip ABduction: 4/5 (was 2+/5) Right Knee Flexion: 4/5 (was 4/5) Right Knee Extension: 4/5 (was 3+/5) Right Ankle Dorsiflexion: 5/5 (was 5/5) Right Ankle Plantar Flexion: 3-/5 (was 2+/5) Right Ankle Inversion: 4/5 (was 3-/5) Right Ankle Eversion: 4/5 (was 3-/5)  Mobility/Balance  Ambulation Distance (Feet): 176 Feet (in 2 minutes) Assistive device: Small based quad cane Gait Pattern: Within Functional Limits Static Standing  Balance Single Leg Stance - Right Leg: 2 (was 0) Single Leg Stance - Left Leg: 2 (was 0) Tandem Stance - Right Leg: 30 (was 0) Tandem Stance - Left Leg: 30 (was 0) Rhomberg - Eyes Opened: 30 (was 10) Rhomberg - Eyes Closed: 30 (was 0)   Exercise/Treatments Standing: Heel Raises: 10 reps Supine Straight Leg Raise: Both 10 reps Sidelying: Hip abduction: 10 reps Both Prone Hip extension Both 10 reps Knee flexion Both 10 reps   Physical Therapy Assessment and Plan    PT Assessment and Plan Clinical Impression Statement: Ms. Lagan has attended 8 OP PT visits over the past 4 weeks to address rt ankle pain and generalized weakness with the following findings: she has improved her rt ankle AROM and strength, currently ambulating indoors independently or w/small based quad cane, pain to rt knee, has decreased confidence with balance and decreased functional strength.  At this time would continue with PT to reach functional goals set for 12 weeks.  Pt will benefit from skilled therapeutic intervention in order to improve on the following deficits: Decreased activity tolerance;Decreased balance;Difficulty walking;Decreased strength;Pain;Impaired perceived functional ability Rehab Potential: Good PT Frequency: Min 2X/week PT Duration: 8 weeks PT Treatment/Interventions: Gait training;Stair training;Therapeutic activities;Therapeutic exercise;Balance training;Neuromuscular re-education;Patient/family education;Manual techniques;DME instruction     Goals Pt will Perform Home Exercise Program: Independently Met PT Short Term Goals: 4 weeks PT Short Term Goal 1: Pt will improve LE strength by 1 muscle grade to ambulate indoors w/SPC w/moderate gait abnormalities. : Met PT Short Term Goal 2: Pt will report pain to  knee less than 3/10 for improved QOL.   (pain to ankle 2-3/10; pain to knee 5/10): Partly met PT Short Term Goal 3: Pt will improve static standing balance and demo tandem stance x10 sec  on solid surface. : Met PT Short Term Goal 4: Pt will improve ankle AROM to [facility] in order to go from STS with appropriate mechanics. : Met PT Long Term Goals: 12 weeks PT Long Term Goal 1: Pt will improve LE strength in order to ambulate independently in indoor and outdoor environment to improve QOL. : Progressing toward goal PT Long Term Goal 2: Pt will improve her ABC to 75% for improved percieved functional ability. : Progressing toward goal Long Term Goal 3: Pt will improve 2 minute walk test independently and complete 300 feet for improved safety with community ambulation.  (quad cane 176 feet): Progressing toward goal Long Term Goal 4: New Goal: 05/28/12: Pt will improve her BLE strength to [facility] in order to ascend and descend 20 steps w/one handrail with at least step to pattern in order to safely enter the beach house in June.   Problem List Patient Active Problem List  Diagnosis  . Ankle fracture, bimalleolar, closed  . Skin breakdown  . Gait abnormality   PT Plan of Care  PT Patient Instructions: discussed ABC, balance concerns, progressing POC. updated goals, answered questions  Consulted and Agree with Plan of Care: Patient    GP MOBILITY AND WALKING AROUND THERAPY MOBILITY GOAL STATUS [id number]  GP, CJ   THERAPY MOBILITY CURRENT STATUS [id number] GP, CK    LISA MASSIE, PT 05/28/2012, 10:20 AM  Physician Documentation Your signature is required to indicate approval of the treatment plan as stated above.  Please sign and either send electronically or make a copy of this report for your files and return this physician signed original.   Please mark one 1.__approve of plan  2. ___approve of plan with the following conditions.   ______________________________                                                          _____________________ Physician Signature                                                                                                             Date

## 2012-06-02 ENCOUNTER — Ambulatory Visit (HOSPITAL_COMMUNITY): Payer: Medicare Other

## 2012-06-04 ENCOUNTER — Ambulatory Visit (HOSPITAL_COMMUNITY)
Admission: RE | Admit: 2012-06-04 | Discharge: 2012-06-04 | Disposition: A | Discharge status: Home / Self Care | Payer: Medicare Other | Source: Ambulatory Visit | Attending: Orthopedic Surgery | Admitting: Orthopedic Surgery

## 2012-06-04 ENCOUNTER — Telehealth: Payer: Self-pay | Admitting: Orthopedic Surgery

## 2012-06-04 DIAGNOSIS — M25579 Pain in unspecified ankle and joints of unspecified foot: Secondary | ICD-10-CM | POA: Diagnosis not present

## 2012-06-04 DIAGNOSIS — M6281 Muscle weakness (generalized): Secondary | ICD-10-CM | POA: Diagnosis not present

## 2012-06-04 NOTE — Progress Notes (Signed)
Physical Therapy Treatment Patient Details  Name: Cynthia Marks MRN: [id number] Date of Birth: Dec 06, 1932  Today's Date: 06/04/2012 Time: 5366-4403 PT Time Calculation (min): 49 min Charges: 5' Te Visit#: 9 of 16  Re-eval: 06/27/12    Authorization: Medicare  Authorization Time Period:    Authorization Visit#: 9 of 16   Subjective: Symptoms/Limitations Symptoms: Pt reports that she had a rough weekend with knee pain and difficulty riding in the car from [place] and back.  Pain Assessment Currently in Pain?: Yes Pain Score:   5 Pain Location: Knee Pain Orientation: Right  Precautions/Restrictions     Exercise/Treatments Aerobic Elliptical: NuStep: 8 minutes, Resistance 3, seat 9 for activity tolerance Standing Forward Step Up: Both;3 sets;5 reps;Hand Hold: 1;Step Height: 4" Step Down: Both;3 sets;5 reps;Hand Hold: 1;Step Height: 4" Seated Other Seated Knee Exercises: 5 sit to stands  Supine Hip Adduction Isometric: Both;10 reps;Limitations;3 sets (5 sec holds) Hip Adduction Isometric Limitations: Hip abduction 10x5 sec holds w/blue theraband Bridges: 10 reps;Limitations Bridges Limitations: 5 sec holds Other Supine Knee Exercises: core activities: pilates 100's in hooklying 2x50 pulses; obliques 10 reps Bil  Physical Therapy Assessment and Plan PT Assessment and Plan Clinical Impression Statement: Treatment focused on improving LE activity tolerance and strength with added hold time to open chain LE activities.  Added core activities to improve posture and balance.  Pt will benefit from skilled therapeutic intervention in order to improve on the following deficits: Decreased activity tolerance;Decreased balance;Difficulty walking;Decreased strength;Pain;Impaired perceived functional ability Rehab Potential: Good PT Frequency: Min 2X/week PT Duration: 8 weeks PT Treatment/Interventions: Gait training;Stair training;Therapeutic activities;Therapeutic  exercise;Balance training;Neuromuscular re-education;Patient/family education;Manual techniques;DME instruction PT Plan: Continue to improve LE and core strength     Goals    Problem List Patient Active Problem List  Diagnosis  . Ankle fracture, bimalleolar, closed  . Skin breakdown  . Gait abnormality    PT Plan of Care PT Home Exercise Plan: Updated HEP to include core and LE strengthening activities and provided with blue T-band.  PT Patient Instructions: Discussed importance of activity tolerance and core strength in order to reach goals.  Consulted and Agree with Plan of Care: Patient  GP Functional Assessment Tool Used: ABC: 56%  [name] 06/04/2012, 2:39 PM

## 2012-06-04 NOTE — Telephone Encounter (Signed)
Cynthia Marks has been wearing the brace on her right ankle since 04/06/12.  She is in therapy and said the therapist wants  to know how much longer she needs to wear the brace. Her # [phone]

## 2012-06-05 NOTE — Telephone Encounter (Signed)
6 months

## 2012-06-08 NOTE — Telephone Encounter (Signed)
Patient called back, relayed doctor's message

## 2012-06-08 NOTE — Telephone Encounter (Signed)
Left message to call back  

## 2012-06-09 ENCOUNTER — Ambulatory Visit (HOSPITAL_COMMUNITY)
Admission: RE | Admit: 2012-06-09 | Discharge: 2012-06-09 | Disposition: A | Discharge status: Home / Self Care | Payer: Medicare Other | Source: Ambulatory Visit | Attending: Orthopedic Surgery | Admitting: Orthopedic Surgery

## 2012-06-09 DIAGNOSIS — M6281 Muscle weakness (generalized): Secondary | ICD-10-CM | POA: Diagnosis not present

## 2012-06-09 DIAGNOSIS — M25579 Pain in unspecified ankle and joints of unspecified foot: Secondary | ICD-10-CM | POA: Diagnosis not present

## 2012-06-09 DIAGNOSIS — IMO0001 Reserved for inherently not codable concepts without codable children: Secondary | ICD-10-CM | POA: Diagnosis not present

## 2012-06-09 DIAGNOSIS — R269 Unspecified abnormalities of gait and mobility: Secondary | ICD-10-CM | POA: Diagnosis not present

## 2012-06-09 NOTE — Progress Notes (Signed)
Physical Therapy Treatment Patient Details  Name: Cynthia Marks MRN: [id number] Date of Birth: 11/24/1932  Today's Date: 06/09/2012 Time: 4098-1191 PT Time Calculation (min): 42 min Charges: 67' TE Visit#: 10 of 16  Re-eval: 06/27/12   Authorization: Medicare  Authorization Time Period:    Authorization Visit#: 10 of 16   Subjective: Symptoms/Limitations Symptoms: Pt continues to wear brace for 6 more months  Pain Assessment Currently in Pain?: Yes Pain Score:   3 Pain Location: Knee (and ankle) Pain Orientation: Right  Exercise/Treatments Aerobic Elliptical: NuStep: hills #2,47minutes, Resistance 2, seat 9 for activity tolerance Standing Rocker Board: 5 minutes;Limitations Rocker Board Limitations: R/L w/max assist to decreae UE assistance Gait Training: cane walking long hallway 2 round trips Other Standing Knee Exercises: lateral stepping: 3 steps x7 reps, 2 steps  Seated Long Arc Quad: Both;10 reps;Weights;Limitations Long Arc Quad Weight: 5 lbs. Long [employer] Limitations: hamstring curls blue theraband x15 reps Both Other Seated Knee Exercises: 2x10 sit to stands  Supine Other Supine Knee Exercises: core activities: pilates 100's in hooklying 2x50 pulses; obliques 12 reps Bil, ball isometrics recuts abdominous and obliques 5x5 sec holds each  Physical Therapy Assessment and Plan PT Assessment and Plan Clinical Impression Statement: Continued to improve overall LE strength and core strength.  Requires max cueing and encouragment to decreae BUE support with rocker board.  Pt is able to demonstarte with UE assist however reports that she "feels" that she has to reach for the bars.  Discussed importance of improving confiedence with balance in a safe location.  Added bilateral cane walking with therapist to improve gait speed and posture.  Pt will benefit from skilled therapeutic intervention in order to improve on the following deficits: Decreased activity  tolerance;Decreased balance;Difficulty walking;Decreased strength;Pain;Impaired perceived functional ability Rehab Potential: Good PT Frequency: Min 2X/week PT Duration: 8 weeks PT Treatment/Interventions: Gait training;Stair training;Therapeutic activities;Therapeutic exercise;Balance training;Neuromuscular re-education;Patient/family education;Manual techniques;DME instruction PT Plan: Continue to improve LE and core strength and gait speed.    Goals    Problem List Patient Active Problem List  Diagnosis  . Ankle fracture, bimalleolar, closed  . Skin breakdown  . Gait abnormality   Honest Vanleer, PT 06/09/2012, 11:57 AM

## 2012-06-11 ENCOUNTER — Ambulatory Visit (HOSPITAL_COMMUNITY)
Admission: RE | Admit: 2012-06-11 | Discharge: 2012-06-11 | Disposition: A | Discharge status: Home / Self Care | Payer: Medicare Other | Source: Ambulatory Visit | Attending: Orthopedic Surgery | Admitting: Orthopedic Surgery

## 2012-06-11 DIAGNOSIS — M6281 Muscle weakness (generalized): Secondary | ICD-10-CM | POA: Diagnosis not present

## 2012-06-11 DIAGNOSIS — M25579 Pain in unspecified ankle and joints of unspecified foot: Secondary | ICD-10-CM | POA: Diagnosis not present

## 2012-06-11 DIAGNOSIS — IMO0001 Reserved for inherently not codable concepts without codable children: Secondary | ICD-10-CM | POA: Diagnosis not present

## 2012-06-11 DIAGNOSIS — R269 Unspecified abnormalities of gait and mobility: Secondary | ICD-10-CM | POA: Diagnosis not present

## 2012-06-11 NOTE — Progress Notes (Signed)
Physical Therapy Treatment Patient Details  Name: Cynthia Marks MRN: [id number] Date of Birth: 05-30-1932  Today's Date: 06/11/2012 Time: 1101-1148 PT Time Calculation (min): 47 min Charges: 40' TE Visit#: 11 of 16  Re-eval: 06/27/12    Authorization: Medicare  Authorization Time Period:    Authorization Visit#: 10 of 18   Subjective: Symptoms/Limitations Symptoms: Pt reports that she has family coming over tonight who wil be staying upstairs and still is uncomfortable going up and down her stairs. Continues to have pain to her Rt knee.  Pain Assessment Currently in Pain?: Yes Pain Score:   3 Pain Location: Knee  Exercise/Treatments Aerobic Elliptical: NuStep: hills #3,66minutes, Resistance 2, seat 9 for activity tolerance Standing Stairs: gym steps lateral and forward 2 round trips each direction Gait Training: independent in long hallway x8 minutes, throughout hospital x12 minutes w/max cueing for posture, stride length and foot placement.   Physical Therapy Assessment and Plan PT Assessment and Plan Clinical Impression Statement: Pt able to improve activity tolerance and ambulates independent for 20 minutes at slow speed requires max cueing HHA for to encourage UE movements.  Has improved posture, decreased stride length.  Rt knee pain likely from rt foot toe out causing increased medial pressure to knee. Max cueing and education for pt to improve foot mechanics.  PT Treatment/Interventions: Engineer, water;Therapeutic activities;Therapeutic exercise;Balance training;Neuromuscular re-education;Patient/family education;Manual techniques;DME instruction PT Plan: Continue to improve LE and core strength and gait speed.    Goals    Problem List Patient Active Problem List  Diagnosis  . Ankle fracture, bimalleolar, closed  . Skin breakdown  . Gait abnormality    PT Plan of Care Consulted and Agree with Plan of Care: Patient  Cadence Minton,  PT 06/11/2012, 1:00 PM

## 2012-06-12 DIAGNOSIS — E119 Type 2 diabetes mellitus without complications: Secondary | ICD-10-CM | POA: Diagnosis not present

## 2012-06-12 DIAGNOSIS — E1149 Type 2 diabetes mellitus with other diabetic neurological complication: Secondary | ICD-10-CM | POA: Diagnosis not present

## 2012-06-16 ENCOUNTER — Ambulatory Visit (HOSPITAL_COMMUNITY)
Admission: RE | Admit: 2012-06-16 | Discharge: 2012-06-16 | Disposition: A | Discharge status: Home / Self Care | Payer: Medicare Other | Source: Ambulatory Visit | Attending: Orthopedic Surgery | Admitting: Orthopedic Surgery

## 2012-06-16 DIAGNOSIS — M6281 Muscle weakness (generalized): Secondary | ICD-10-CM | POA: Diagnosis not present

## 2012-06-16 DIAGNOSIS — R269 Unspecified abnormalities of gait and mobility: Secondary | ICD-10-CM | POA: Insufficient documentation

## 2012-06-16 DIAGNOSIS — M25579 Pain in unspecified ankle and joints of unspecified foot: Secondary | ICD-10-CM | POA: Diagnosis not present

## 2012-06-16 DIAGNOSIS — IMO0001 Reserved for inherently not codable concepts without codable children: Secondary | ICD-10-CM | POA: Insufficient documentation

## 2012-06-16 NOTE — Progress Notes (Signed)
Physical Therapy Treatment Patient Details  Name: Cynthia Marks MRN: [id number] Date of Birth: [birth year]  Today's Date: 06/16/2012 Time: 4098-1191 PT Time Calculation (min): 31 min  Visit#: 12 of 16  Re-eval: 06/27/12 Authorization: Medicare  Authorization Visit#: 11 of 18  Charges:  therex 15', self care 15'  Subjective: Symptoms/Limitations Symptoms: Pt. states she feel out of bed Saturday morning and hurt her L knee.  States she doesnt think she will be able to do much today.  Pain Lt knee 6/10, Rt knee 2/10.   Pain Assessment Currently in Pain?: Yes Pain Score:   2 Pain Location: Knee Pain Orientation: Right   Exercise/Treatments Aerobic Elliptical: NuStep: hills #3,9minutes, Resistance 2, seat 9 for activity tolerance    Physical Therapy Assessment and Plan PT Assessment and Plan Clinical Impression Statement: Lt knee brusied anteriorly and inferiorly from fall onto hard wood floor.  Pt. using QC today to assist with the pain/soreness from L LE.  Pt. eduated in ways to help with swelling/pain.  Able to complete the nustep, however stated she did not want to attempt any other activities today.   PT Treatment/Interventions: Engineer, water;Therapeutic activities;Therapeutic exercise;Balance training;Neuromuscular re-education;Patient/family education;Manual techniques;DME instruction PT Plan: Continue to improve LE and core strength and gait speed.  Check Lt knee and toe next visit     Problem List Patient Active Problem List  Diagnosis  . Ankle fracture, bimalleolar, closed  . Skin breakdown  . Gait abnormality    Lurena Nida, PTA/CLT 06/16/2012, 12:01 PM

## 2012-06-18 ENCOUNTER — Ambulatory Visit (HOSPITAL_COMMUNITY)
Admission: RE | Admit: 2012-06-18 | Discharge: 2012-06-18 | Disposition: A | Discharge status: Home / Self Care | Payer: Medicare Other | Source: Ambulatory Visit | Attending: Orthopedic Surgery | Admitting: Orthopedic Surgery

## 2012-06-18 DIAGNOSIS — IMO0001 Reserved for inherently not codable concepts without codable children: Secondary | ICD-10-CM | POA: Diagnosis not present

## 2012-06-18 DIAGNOSIS — R269 Unspecified abnormalities of gait and mobility: Secondary | ICD-10-CM | POA: Diagnosis not present

## 2012-06-18 DIAGNOSIS — M25579 Pain in unspecified ankle and joints of unspecified foot: Secondary | ICD-10-CM | POA: Diagnosis not present

## 2012-06-18 DIAGNOSIS — M6281 Muscle weakness (generalized): Secondary | ICD-10-CM | POA: Diagnosis not present

## 2012-06-18 NOTE — Progress Notes (Signed)
Physical Therapy Treatment Patient Details  Name: Cynthia Marks MRN: [id number] Date of Birth: [birth year]  Today's Date: 06/18/2012 Time: 7829-5621 PT Time Calculation (min): 39 min  Visit#: 13 of 16  Re-eval: 06/27/12 Authorization: Medicare  Authorization Visit#: 13 of 18  Charges:  There activity 24', gait 10'  Subjective: Symptoms/Limitations Symptoms: Pt. states her Lt knee is feeling better, however still needs to ambulate with her QC due to increased pain without it. Pain Assessment Currently in Pain?: Yes Pain Score:   2 Pain Location: Knee Pain Orientation: Right   Exercise/Treatments Aerobic Elliptical: NuStep: hills #3,77minutes, Resistance 2, seat 9 for activity tolerance Standing Gait Training: 700 feet in 6:39 with QC on Rt. side (for Lt. LE)    Physical Therapy Assessment and Plan PT Assessment and Plan Clinical Impression Statement: Continued increased activity tolerance.  Able to complete full 15' on nustep without rest break and ambulate 700 feet in 7 minutes.  continues with pain/soreness in LLE, however reports overall improvment PT Treatment/Interventions: Gait training;Stair training;Therapeutic activities;Therapeutic exercise;Balance training;Neuromuscular re-education;Patient/family education;Manual techniques;DME instruction PT Plan: Continue to improve LE and core strength and gait speed.  Outdoor ambulation without AD when Lt knee allows.     Problem List Patient Active Problem List  Diagnosis  . Ankle fracture, bimalleolar, closed  . Skin breakdown  . Gait abnormality    PT Plan of Care Consulted and Agree with Plan of Care: Patient   Lurena Nida, PTA/CLT 06/18/2012, 12:20 PM

## 2012-06-23 ENCOUNTER — Ambulatory Visit (HOSPITAL_COMMUNITY)
Admission: RE | Admit: 2012-06-23 | Discharge: 2012-06-23 | Disposition: A | Discharge status: Home / Self Care | Payer: Medicare Other | Source: Ambulatory Visit | Attending: Orthopedic Surgery | Admitting: Orthopedic Surgery

## 2012-06-23 DIAGNOSIS — M25579 Pain in unspecified ankle and joints of unspecified foot: Secondary | ICD-10-CM | POA: Diagnosis not present

## 2012-06-23 DIAGNOSIS — R269 Unspecified abnormalities of gait and mobility: Secondary | ICD-10-CM | POA: Diagnosis not present

## 2012-06-23 DIAGNOSIS — M6281 Muscle weakness (generalized): Secondary | ICD-10-CM | POA: Diagnosis not present

## 2012-06-23 DIAGNOSIS — IMO0001 Reserved for inherently not codable concepts without codable children: Secondary | ICD-10-CM | POA: Diagnosis not present

## 2012-06-23 NOTE — Progress Notes (Signed)
Physical Therapy Treatment Patient Details  Name: DARNISE MONTAG MRN: [id number] Date of Birth: [birth year]  Today's Date: 06/23/2012 Time: 4401-0272 PT Time Calculation (min): 47 min  Visit#: 14 of 16  Re-eval: 06/27/12 Charges: Therex x 28' Gait x 10'  Authorization: Medicare  Authorization Visit#: 14 of 18   Subjective: Symptoms/Limitations Symptoms: Pt reported falling off bed and hurting Rt knee, no current pain.  Pt has been walking with QC since fall.   Pain Assessment Currently in Pain?: No/denies   Exercise/Treatments Aerobic Elliptical: NuStep: hills #3,75minutes, Resistance 2, seat 9 for activity tolerance Standing Gait Training: 680 feet in 10' without AD and SBA Other Standing Knee Exercises: Tall marching x 10 Seated Other Seated Knee Exercises: Sit to stand x 10 without UE assistance SBA  Physical Therapy Assessment and Plan PT Assessment and Plan Clinical Impression Statement: Pt displays improved activity tolerance. Pt able to tolerate gait training for 10' without assistive device. Pt also displays improved gait mechanics. Pt is without complaint throughout session.  PT Plan: Continue to improve LE and core strength and gait speed.  Outdoor ambulation without AD when Lt knee allows.     Problem List Patient Active Problem List  Diagnosis  . Ankle fracture, bimalleolar, closed  . Skin breakdown  . Gait abnormality    PT - End of Session Equipment Utilized During Treatment: Gait belt Activity Tolerance: Patient tolerated treatment well General Behavior During Session: [facility] for tasks performed Cognition: [facility] for tasks performed  Seth Bake, PTA 06/23/2012, 12:00 PM

## 2012-06-25 ENCOUNTER — Ambulatory Visit (HOSPITAL_COMMUNITY)
Admission: RE | Admit: 2012-06-25 | Discharge: 2012-06-25 | Disposition: A | Discharge status: Home / Self Care | Payer: Medicare Other | Source: Ambulatory Visit | Attending: Orthopedic Surgery | Admitting: Orthopedic Surgery

## 2012-06-25 DIAGNOSIS — R269 Unspecified abnormalities of gait and mobility: Secondary | ICD-10-CM | POA: Diagnosis not present

## 2012-06-25 DIAGNOSIS — IMO0001 Reserved for inherently not codable concepts without codable children: Secondary | ICD-10-CM | POA: Diagnosis not present

## 2012-06-25 DIAGNOSIS — M6281 Muscle weakness (generalized): Secondary | ICD-10-CM | POA: Diagnosis not present

## 2012-06-25 DIAGNOSIS — M25579 Pain in unspecified ankle and joints of unspecified foot: Secondary | ICD-10-CM | POA: Diagnosis not present

## 2012-06-25 NOTE — Evaluation (Signed)
Physical Therapy Discharge Summary  Patient Details  Name: Cynthia Marks MRN: [id number] Date of Birth: September 30, 1932  Today's Date: 06/25/2012 Time: 1105-1155 PT Time Calculation (min): 50 min Charges: 1 ROM, 1 MMT, 23' gait, 25' Self Care             Visit#: 15 of 16  Re-eval: 06/27/12 Assessment Diagnosis: R bimallelar fracture  Next MD Visit: Dr. Romeo Apple - unschedueled  Authorization: Medicare    Authorization Time Period:    Authorization Visit#: 15 of 18   Subjective Symptoms/Limitations Symptoms: Pt reports that she is ready to be done with PT for now.  She feels she can continue to work on her exercises at home and is starting to walk more outside with the nicer weather.  Pain Assessment Currently in Pain?: Yes Pain Score:   2 Pain Location: Knee Pain Orientation: Right  Sensation/Coordination/Flexibility/Functional Tests Functional Tests Functional Tests: 5 STS: 12 sec (18 sec from lowered mat surface, 17.5 cm)) Functional Tests: ABC: 63% (was 47%)  RLE AROM (degrees) Right Ankle Dorsiflexion: 5 (was 0) Right Ankle Plantar Flexion: 45 (was 40) Right Ankle Inversion: 35 (was 30) Right Ankle Eversion: 35 (was 32) RLE Strength Right Hip Flexion: 4/5 (was 3/5) Right Hip Extension: 3+/5 (was 3/5) Right Hip ABduction: 4/5 (was 4/5) Right Knee Flexion: 5/5 (was 4/5) Right Knee Extension:  (4+/5, was 4/5) Right Ankle Dorsiflexion: 5/5 (was 5/5) Right Ankle Plantar Flexion: 4/5 (was 3-/5) Right Ankle Inversion:  (4+/5, was 4/5) Right Ankle Eversion:  (4+/5,was 4/5)  Exercise/Treatments Ambulation/Gait Ambulation/Gait: Yes Ambulation Distance (Feet):  (outdoors/indoors x20 minutes) Assistive device: None;Small based quad cane Gait Pattern: Within Functional Limits Ramp: 5: Supervision Curb: 4: Min assist Static Standing Balance Single Leg Stance - Right Leg: 5 (was 2) Single Leg Stance - Left Leg: 7 (was 2) Tandem Stance - Right Leg:  (was 30) Tandem  Stance - Left Leg:  (was 30)    Physical Therapy Assessment and Plan PT Assessment and Plan Clinical Impression Statement: Cynthia Marks has attended 15 OP PT visits s/p R bimalleolar fracture with the following findings: she continues to have decreased gait speed and increased fear of falling.  She is currently ambulating independently in her home, requires [facility] for outdoor ambulation and min A for steps with handrail.  During her treatment of therapy she had a fall out of her own bed at home which has left her with pain and tenderness to her Lt knee which limits her in some of her mobility.  At this time pt has family obligations and travel plans which will interfer with continuing PT.  At this time pt wishes to be d/c from PT and will f/u with her MD to discuss brace use.  PT Plan: D/C secondary to pt request.     Goals Home Exercise Program Pt will Perform Home Exercise Program: Independently: Met PT Short Term Goals: 4 weeks PT Short Term Goal 1: Pt will improve LE strength by 1 muscle grade to ambulate indoors w/SPC w/moderate gait abnormalities.  Met PT Short Term Goal 2: Pt will report pain to knee less than 3/10 for improved QOL.   (pain to ankle 2-3/10; pain to knee 5/10): Met PT Short Term Goal 3: Pt will improve static standing balance and demo tandem stance x10 sec on solid surface. : Met PT Short Term Goal 4: Pt will improve ankle AROM to [facility] in order to go from STS with appropriate mechanics. : Met PT Long Term Goals: 69  weeks PT Long Term Goal 1: Pt will improve LE strength in order to ambulate independently in indoor and outdoor environment to improve QOL. : Progressing toward goal PT Long Term Goal 2: Pt will improve her ABC to 75% for improved percieved functional ability. : Progressing toward goal Long Term Goal 3: Pt will improve 2 minute walk test independently and complete 300 feet for improved safety with community ambulation.  (quad cane 176 feet): Progressing toward goal Long  Term Goal 4: New Goal: 05/28/12: Pt will improve her BLE strength to [facility] in order to ascend and descend 20 steps w/one handrail with at least step to pattern in order to safely enter the beach house in June.  (will be practcing stairs at home. ): Progressing toward goal  Problem List Patient Active Problem List  Diagnosis  . Ankle fracture, bimalleolar, closed  . Skin breakdown  . Gait abnormality    PT - End of Session Equipment Utilized During Treatment: Gait belt Activity Tolerance: Patient tolerated treatment well General Behavior During Session: [facility] for tasks performed Cognition: [facility] for tasks performed PT Plan of Care PT Patient Instructions: discussed ABC Consulted and Agree with Plan of Care: Patient  GP Functional Assessment Tool Used: ABC: 63% Functional Limitation: Mobility: Walking and moving around Mobility: Walking and Moving Around Goal Status [phone]): At least 20 percent but less than 40 percent impaired, limited or restricted  Cynthia Marks, PT 06/25/2012, 1:15 PM  Physician Documentation Your signature is required to indicate approval of the treatment plan as stated above.  Please sign and either send electronically or make a copy of this report for your files and return this physician signed original.   Please mark one 1.__approve of plan  2. ___approve of plan with the following conditions.   ______________________________                                                          _____________________ Physician Signature                                                                                                             Date

## 2012-07-17 ENCOUNTER — Other Ambulatory Visit: Payer: Self-pay | Admitting: Family Medicine

## 2012-07-17 DIAGNOSIS — E039 Hypothyroidism, unspecified: Secondary | ICD-10-CM | POA: Diagnosis not present

## 2012-07-17 LAB — TSH: TSH: 4.699 u[IU]/mL — ABNORMAL HIGH (ref 0.350–4.500)

## 2012-07-17 LAB — T4, FREE: Free T4: 1.33 ng/dL (ref 0.80–1.80)

## 2012-07-22 ENCOUNTER — Other Ambulatory Visit: Payer: Self-pay | Admitting: Family Medicine

## 2012-07-27 DIAGNOSIS — H35359 Cystoid macular degeneration, unspecified eye: Secondary | ICD-10-CM | POA: Diagnosis not present

## 2012-07-27 DIAGNOSIS — H3570 Unspecified separation of retinal layers: Secondary | ICD-10-CM | POA: Diagnosis not present

## 2012-07-27 DIAGNOSIS — H353190 Nonexudative age-related macular degeneration, unspecified eye, stage unspecified: Secondary | ICD-10-CM | POA: Diagnosis not present

## 2012-07-27 DIAGNOSIS — H35059 Retinal neovascularization, unspecified, unspecified eye: Secondary | ICD-10-CM | POA: Diagnosis not present

## 2012-07-29 ENCOUNTER — Encounter: Payer: Self-pay | Admitting: *Deleted

## 2012-08-10 DIAGNOSIS — R6889 Other general symptoms and signs: Secondary | ICD-10-CM | POA: Diagnosis not present

## 2012-08-11 ENCOUNTER — Inpatient Hospital Stay (HOSPITAL_COMMUNITY)
Admission: EM | Admit: 2012-08-11 | Discharge: 2012-08-13 | DRG: 872 | Disposition: A | Discharge status: Home Health | Payer: Medicare Other | Attending: Family Medicine | Admitting: Family Medicine

## 2012-08-11 ENCOUNTER — Encounter (HOSPITAL_COMMUNITY): Payer: Self-pay

## 2012-08-11 DIAGNOSIS — N179 Acute kidney failure, unspecified: Secondary | ICD-10-CM | POA: Diagnosis present

## 2012-08-11 DIAGNOSIS — Z886 Allergy status to analgesic agent status: Secondary | ICD-10-CM

## 2012-08-11 DIAGNOSIS — N12 Tubulo-interstitial nephritis, not specified as acute or chronic: Secondary | ICD-10-CM

## 2012-08-11 DIAGNOSIS — E039 Hypothyroidism, unspecified: Secondary | ICD-10-CM | POA: Diagnosis present

## 2012-08-11 DIAGNOSIS — A498 Other bacterial infections of unspecified site: Secondary | ICD-10-CM | POA: Diagnosis present

## 2012-08-11 DIAGNOSIS — N10 Acute tubulo-interstitial nephritis: Secondary | ICD-10-CM | POA: Diagnosis present

## 2012-08-11 DIAGNOSIS — R7881 Bacteremia: Principal | ICD-10-CM | POA: Diagnosis present

## 2012-08-11 DIAGNOSIS — E860 Dehydration: Secondary | ICD-10-CM | POA: Diagnosis not present

## 2012-08-11 DIAGNOSIS — E870 Hyperosmolality and hypernatremia: Secondary | ICD-10-CM | POA: Diagnosis not present

## 2012-08-11 DIAGNOSIS — E871 Hypo-osmolality and hyponatremia: Secondary | ICD-10-CM | POA: Diagnosis present

## 2012-08-11 DIAGNOSIS — Z880 Allergy status to penicillin: Secondary | ICD-10-CM | POA: Diagnosis not present

## 2012-08-11 DIAGNOSIS — Z6841 Body Mass Index (BMI) 40.0 and over, adult: Secondary | ICD-10-CM

## 2012-08-11 DIAGNOSIS — D649 Anemia, unspecified: Secondary | ICD-10-CM | POA: Diagnosis present

## 2012-08-11 DIAGNOSIS — H3530 Unspecified macular degeneration: Secondary | ICD-10-CM | POA: Diagnosis present

## 2012-08-11 DIAGNOSIS — E119 Type 2 diabetes mellitus without complications: Secondary | ICD-10-CM | POA: Diagnosis not present

## 2012-08-11 DIAGNOSIS — I10 Essential (primary) hypertension: Secondary | ICD-10-CM | POA: Diagnosis present

## 2012-08-11 DIAGNOSIS — R6889 Other general symptoms and signs: Secondary | ICD-10-CM | POA: Diagnosis not present

## 2012-08-11 DIAGNOSIS — E1165 Type 2 diabetes mellitus with hyperglycemia: Secondary | ICD-10-CM | POA: Diagnosis present

## 2012-08-11 DIAGNOSIS — R109 Unspecified abdominal pain: Secondary | ICD-10-CM | POA: Diagnosis not present

## 2012-08-11 DIAGNOSIS — R509 Fever, unspecified: Secondary | ICD-10-CM | POA: Diagnosis not present

## 2012-08-11 DIAGNOSIS — N390 Urinary tract infection, site not specified: Secondary | ICD-10-CM | POA: Diagnosis not present

## 2012-08-11 DIAGNOSIS — E669 Obesity, unspecified: Secondary | ICD-10-CM | POA: Diagnosis present

## 2012-08-11 DIAGNOSIS — M1710 Unilateral primary osteoarthritis, unspecified knee: Secondary | ICD-10-CM | POA: Diagnosis present

## 2012-08-11 LAB — CBC WITH DIFFERENTIAL/PLATELET
Basophils Absolute: 0 10*3/uL (ref 0.0–0.1)
Basophils Relative: 0 % (ref 0–1)
Eosinophils Absolute: 0 10*3/uL (ref 0.0–0.7)
MCH: 31.9 pg (ref 26.0–34.0)
MCHC: 33.5 g/dL (ref 30.0–36.0)
Neutro Abs: 12.1 10*3/uL — ABNORMAL HIGH (ref 1.7–7.7)
Neutrophils Relative %: 85 % — ABNORMAL HIGH (ref 43–77)
RDW: 14.6 % (ref 11.5–15.5)

## 2012-08-11 LAB — GLUCOSE, CAPILLARY
Glucose-Capillary: 140 mg/dL — ABNORMAL HIGH (ref 70–99)
Glucose-Capillary: 146 mg/dL — ABNORMAL HIGH (ref 70–99)
Glucose-Capillary: 87 mg/dL (ref 70–99)

## 2012-08-11 LAB — COMPREHENSIVE METABOLIC PANEL
AST: 36 U/L (ref 0–37)
Albumin: 3.6 g/dL (ref 3.5–5.2)
Alkaline Phosphatase: 72 U/L (ref 39–117)
Chloride: 98 mEq/L (ref 96–112)
Creatinine, Ser: 1.36 mg/dL — ABNORMAL HIGH (ref 0.50–1.10)
Potassium: 4.3 mEq/L (ref 3.5–5.1)
Total Bilirubin: 0.6 mg/dL (ref 0.3–1.2)
Total Protein: 7.4 g/dL (ref 6.0–8.3)

## 2012-08-11 LAB — URINALYSIS, ROUTINE W REFLEX MICROSCOPIC
Nitrite: POSITIVE — AB
Protein, ur: >300 mg/dL — AB
Urobilinogen, UA: 4 mg/dL — ABNORMAL HIGH (ref 0.0–1.0)
pH: 5.5 (ref 5.0–8.0)

## 2012-08-11 LAB — URINE MICROSCOPIC-ADD ON

## 2012-08-11 LAB — MRSA PCR SCREENING: MRSA by PCR: NEGATIVE

## 2012-08-11 LAB — HEMOGLOBIN A1C: Mean Plasma Glucose: 114 mg/dL (ref <117)

## 2012-08-11 MED ORDER — SODIUM CHLORIDE 0.9 % IV BOLUS (SEPSIS)
1000.0000 mL | Freq: Once | INTRAVENOUS | Status: AC
  Administered 2012-08-11: 1000 mL via INTRAVENOUS

## 2012-08-11 MED ORDER — LEVOTHYROXINE SODIUM 75 MCG PO TABS
150.0000 ug | ORAL_TABLET | Freq: Every day | ORAL | Status: DC
  Administered 2012-08-11 – 2012-08-13 (×3): 150 ug via ORAL
  Filled 2012-08-11 (×3): qty 2

## 2012-08-11 MED ORDER — LEVOFLOXACIN IN D5W 500 MG/100ML IV SOLN
500.0000 mg | INTRAVENOUS | Status: DC
  Administered 2012-08-11: 500 mg via INTRAVENOUS
  Filled 2012-08-11: qty 100

## 2012-08-11 MED ORDER — ATORVASTATIN CALCIUM 20 MG PO TABS
20.0000 mg | ORAL_TABLET | Freq: Every day | ORAL | Status: DC
  Administered 2012-08-11 – 2012-08-12 (×2): 20 mg via ORAL
  Filled 2012-08-11 (×3): qty 1

## 2012-08-11 MED ORDER — ONDANSETRON HCL 4 MG PO TABS: 4.0000 mg | ORAL_TABLET | Freq: Four times a day (QID) | ORAL | Status: DC | PRN

## 2012-08-11 MED ORDER — INSULIN ASPART 100 UNIT/ML ~~LOC~~ SOLN
0.0000 [IU] | Freq: Three times a day (TID) | SUBCUTANEOUS | Status: DC
  Administered 2012-08-11: 2 [IU] via SUBCUTANEOUS

## 2012-08-11 MED ORDER — LEVOFLOXACIN IN D5W 500 MG/100ML IV SOLN
500.0000 mg | INTRAVENOUS | Status: DC
  Administered 2012-08-12 – 2012-08-13 (×2): 500 mg via INTRAVENOUS
  Filled 2012-08-11 (×2): qty 100

## 2012-08-11 MED ORDER — ALBUTEROL SULFATE (5 MG/ML) 0.5% IN NEBU: 2.5000 mg | INHALATION_SOLUTION | RESPIRATORY_TRACT | Status: DC | PRN

## 2012-08-11 MED ORDER — INSULIN ASPART 100 UNIT/ML ~~LOC~~ SOLN: 0.0000 [IU] | Freq: Every day | SUBCUTANEOUS | Status: DC

## 2012-08-11 MED ORDER — ONDANSETRON HCL 4 MG/2ML IJ SOLN: 4.0000 mg | Freq: Four times a day (QID) | INTRAMUSCULAR | Status: DC | PRN

## 2012-08-11 MED ORDER — ACETAMINOPHEN 325 MG PO TABS
650.0000 mg | ORAL_TABLET | Freq: Four times a day (QID) | ORAL | Status: DC | PRN
  Administered 2012-08-11 – 2012-08-13 (×3): 650 mg via ORAL
  Filled 2012-08-11 (×3): qty 2

## 2012-08-11 MED ORDER — ACETAMINOPHEN 650 MG RE SUPP: 650.0000 mg | Freq: Four times a day (QID) | RECTAL | Status: DC | PRN

## 2012-08-11 MED ORDER — ONDANSETRON HCL 4 MG/2ML IJ SOLN
4.0000 mg | Freq: Once | INTRAMUSCULAR | Status: AC
  Administered 2012-08-11: 4 mg via INTRAVENOUS
  Filled 2012-08-11: qty 2

## 2012-08-11 MED ORDER — HEPARIN SODIUM (PORCINE) 5000 UNIT/ML IJ SOLN
5000.0000 [IU] | Freq: Three times a day (TID) | INTRAMUSCULAR | Status: DC
  Administered 2012-08-11 – 2012-08-13 (×7): 5000 [IU] via SUBCUTANEOUS
  Filled 2012-08-11 (×7): qty 1

## 2012-08-11 MED ORDER — POTASSIUM CHLORIDE IN NACL 20-0.9 MEQ/L-% IV SOLN
INTRAVENOUS | Status: DC
  Administered 2012-08-11 – 2012-08-13 (×4): via INTRAVENOUS

## 2012-08-11 MED ORDER — CARVEDILOL 12.5 MG PO TABS
12.5000 mg | ORAL_TABLET | Freq: Two times a day (BID) | ORAL | Status: DC
  Administered 2012-08-11 – 2012-08-13 (×5): 12.5 mg via ORAL
  Filled 2012-08-11 (×5): qty 1

## 2012-08-11 MED ORDER — DEXTROSE 5 % IV SOLN
1.0000 g | Freq: Once | INTRAVENOUS | Status: AC
  Administered 2012-08-11: 1 g via INTRAVENOUS
  Filled 2012-08-11: qty 10

## 2012-08-11 MED ORDER — OXYCODONE-ACETAMINOPHEN 5-325 MG PO TABS: 1.0000 | ORAL_TABLET | Freq: Four times a day (QID) | ORAL | Status: DC | PRN

## 2012-08-11 MED ORDER — ACETAMINOPHEN 325 MG PO TABS
650.0000 mg | ORAL_TABLET | Freq: Once | ORAL | Status: AC
  Administered 2012-08-11: 650 mg via ORAL
  Filled 2012-08-11 (×2): qty 2

## 2012-08-11 MED ORDER — SODIUM CHLORIDE 0.9 % IV SOLN
INTRAVENOUS | Status: DC
  Administered 2012-08-11: 1000 mL via INTRAVENOUS

## 2012-08-11 NOTE — ED Provider Notes (Signed)
History     CSN: [id number]  Arrival date & time 08/11/12  0221   First MD Initiated Contact with Patient 08/11/12 0230      Chief Complaint  Patient presents with  . Urinary Tract Infection    (Consider location/radiation/quality/duration/timing/severity/associated sxs/prior treatment) HPI...Marland KitchenMarland KitchenMarland Kitchen dysuria, fever, right flank pain for 3 days.   Severity is moderate.    No abdominal pain, chest pain, dyspnea.    Patient broke her right ankle recently and has difficulty ambulating. No radiation of pain Past Medical History  Diagnosis Date  . Diabetes mellitus   . Thyroid disease   . Hypertension   . Macular degeneration   . Arthritis     Knees    Past Surgical History  Procedure Laterality Date  . Abdominal hysterectomy    . Shoulder surgery    . Colonoscopy      Family History  Problem Relation Age of Onset  . Heart disease    . Arthritis    . Cancer    . Diabetes    . Kidney disease      History  Substance Use Topics  . Smoking status: Never Smoker   . Smokeless tobacco: Not on file  . Alcohol Use: No    OB History   Grav Para Term Preterm Abortions TAB SAB Ect Mult Living                  Review of Systems  All other systems reviewed and are negative.    Allergies  Aspirin; Ibuprofen; and Penicillins  Home Medications   Current Outpatient Rx  Name  Route  Sig  Dispense  Refill  . acetaminophen (TYLENOL) 650 MG CR tablet   Oral   Take 650 mg by mouth every 8 (eight) hours as needed. pain         . calcium-vitamin D (CALCIUM 500+D) 500-200 MG-UNIT per tablet   Oral   Take 1 tablet by mouth daily.         . carvedilol (COREG) 12.5 MG tablet   Oral   Take 12.5 mg by mouth 2 (two) times daily.         Marland Kitchen ezetimibe (ZETIA) 10 MG tablet   Oral   Take 10 mg by mouth daily.         . hydrochlorothiazide (HYDRODIURIL) 25 MG tablet   Oral   Take 25 mg by mouth daily.         Marland Kitchen levothyroxine (SYNTHROID, LEVOTHROID) 150 MCG tablet       TAKE TWO TABLETS DAILY   60 tablet   0   . losartan (COZAAR) 100 MG tablet   Oral   Take 100 mg by mouth daily.         Marland Kitchen oxyCODONE-acetaminophen (PERCOCET/ROXICET) 5-325 MG per tablet   Oral   Take 1 tablet by mouth every 6 (six) hours as needed for pain.   20 tablet   0   . pioglitazone (ACTOS) 15 MG tablet   Oral   Take 15 mg by mouth daily.         . rosuvastatin (CRESTOR) 10 MG tablet   Oral   Take 10 mg by mouth daily.         . sitaGLIPtin (JANUVIA) 100 MG tablet   Oral   Take 100 mg by mouth daily.         . traMADol (ULTRAM) 50 MG tablet   Oral   Take 50 mg by mouth  4 (four) times daily.           BP 134/49  Pulse 90  Temp(Src) 103 F (39.4 C) (Oral)  Resp 20  Ht 5\' 8"  (1.727 m)  Wt 275 lb (124.739 kg)  BMI 41.82 kg/m2  SpO2 92%  Physical Exam  Nursing note and vitals reviewed. Constitutional: She is oriented to person, place, and time.  Obese, pale, appears fatigued  HENT:  Head: Normocephalic and atraumatic.  Eyes: Conjunctivae and EOM are normal. Pupils are equal, round, and reactive to light.  Neck: Normal range of motion. Neck supple.  Cardiovascular: Normal rate, regular rhythm and normal heart sounds.   Pulmonary/Chest: Effort normal and breath sounds normal.  Abdominal: Soft. Bowel sounds are normal.  Genitourinary:  Right flank tenderness  Musculoskeletal: Normal range of motion.  Neurological: She is alert and oriented to person, place, and time.  Skin: Skin is warm and dry.  Psychiatric: She has a normal mood and affect.    ED Course  Procedures (including critical care time)  Labs Reviewed  URINALYSIS, ROUTINE W REFLEX MICROSCOPIC - Abnormal; Notable for the following:    APPearance CLOUDY (*)    Specific Gravity, Urine >1.030 (*)    Glucose, UA 100 (*)    Hgb urine dipstick LARGE (*)    Ketones, ur TRACE (*)    Protein, ur >300 (*)    Urobilinogen, UA 4.0 (*)    Nitrite POSITIVE (*)    Leukocytes, UA LARGE  (*)    All other components within normal limits  CBC WITH DIFFERENTIAL - Abnormal; Notable for the following:    WBC 14.2 (*)    RBC 3.70 (*)    Hemoglobin 11.8 (*)    HCT 35.2 (*)    Neutrophils Relative % 85 (*)    Neutro Abs 12.1 (*)    Lymphocytes Relative 4 (*)    Lymphs Abs 0.6 (*)    Monocytes Absolute 1.4 (*)    All other components within normal limits  COMPREHENSIVE METABOLIC PANEL - Abnormal; Notable for the following:    Sodium 132 (*)    Glucose, Bld 144 (*)    BUN 30 (*)    Creatinine, Ser 1.36 (*)    Calcium 10.7 (*)    GFR calc non Af Amer 36 (*)    GFR calc Af Amer 42 (*)    All other components within normal limits  GLUCOSE, CAPILLARY - Abnormal; Notable for the following:    Glucose-Capillary 140 (*)    All other components within normal limits  URINE MICROSCOPIC-ADD ON - Abnormal; Notable for the following:    Bacteria, UA MANY (*)    All other components within normal limits  URINE CULTURE   No results found.   1. Pyelonephritis       MDM  I suspect early pyelonephritis. IV Rocephin given. Urine culture. IV fluids. Admit to general medicine         Donnetta Hutching, MD 08/11/12 [phone]

## 2012-08-11 NOTE — Evaluation (Signed)
Physical Therapy Evaluation Patient Details Name: Cynthia Marks MRN: [id number] DOB: February 05, 1933 Today's Date: 08/11/2012 Time: 5366-4403 PT Time Calculation (min): 65 min  PT Assessment / Plan / Recommendation Clinical Impression  Pt was seen for eval following recent illness.  She is very pleasant, alert and cooperative.  She sustained a R ankle fx 8 mos. ago and did well in rehab, ambulated with a cane for functional distances.  Her husband is concerned that her function has deteriorated over time and now with her recent infection, she has been unable to walk.  She is obese with generalized weakness, wears a brace on the R ankle for protection.  She is sedentary at home, sits in a lift chair a good part of the day.Marland KitchenMarland KitchenCurrently, she needed mod assist with bed transfer and needs an elevated surface from which to stand.  She now needs a walker for gait to maximize safety and function.  She should be able to transition to home at d/c with HHPT.  She will need a rolling walker at d/c.    PT Assessment  Patient needs continued PT services    Follow Up Recommendations  Home health PT    Does the patient have the potential to tolerate intense rehabilitation      Barriers to Discharge None      Equipment Recommendations  Rolling walker with 5" wheels    Recommendations for Other Services     Frequency Min 3X/week    Precautions / Restrictions Precautions Precautions: Fall Required Braces or Orthoses: Other Brace/Splint Other Brace/Splint: short leg brace RLE to be worn for gait. Restrictions Weight Bearing Restrictions: No RLE Weight Bearing: Weight bearing as tolerated   Pertinent Vitals/Pain       Mobility  Bed Mobility Bed Mobility: Supine to Sit;Sit to Supine Supine to Sit: 3: Mod assist;HOB flat Sit to Supine: 4: Min assist;HOB flat Details for Bed Mobility Assistance: needs assist to left RLE into bed with sit to supine Transfers Transfers: Sit to Stand;Stand to  Sit Sit to Stand: 4: Min assist;From elevated surface;With upper extremity assist Stand to Sit: 5: Supervision;To chair/3-in-1;To bed;With upper extremity assist Details for Transfer Assistance: uses a lift chair at home Ambulation/Gait Ambulation/Gait Assistance: 5: Supervision Ambulation Distance (Feet): 150 Feet Assistive device: Rolling walker Ambulation/Gait Assistance Details: attempted gait with a quad cane (this is what she has been using at home) but she needed mod assist to take 2 steps...she is stable with a walker Gait Pattern: Within Functional Limits Gait velocity: WNL Stairs: No Wheelchair Mobility Wheelchair Mobility: No    Exercises General Exercises - Lower Extremity Short Arc Quad: AROM;Both;10 reps;Supine Heel Slides: AAROM;10 reps;Supine;Both;AROM Hip ABduction/ADduction: AROM;AAROM;Both;10 reps;Supine   PT Diagnosis: Difficulty walking;Generalized weakness  PT Problem List: Decreased strength;Decreased activity tolerance;Decreased mobility;Obesity PT Treatment Interventions: Gait training;Functional mobility training;Therapeutic exercise;Therapeutic activities;Patient/family education   PT Goals Acute Rehab PT Goals PT Goal Formulation: With patient/family Time For Goal Achievement: 08/18/12 Potential to Achieve Goals: Good Pt will go Supine/Side to Sit: with min assist;with HOB 0 degrees PT Goal: Supine/Side to Sit - Progress: Goal set today Pt will go Sit to Supine/Side: with supervision;with HOB 0 degrees PT Goal: Sit to Supine/Side - Progress: Goal set today Pt will go Sit to Stand: with supervision;with upper extremity assist;from elevated surface PT Goal: Sit to Stand - Progress: Goal set today Pt will go Stand to Sit: with modified independence;with upper extremity assist PT Goal: Stand to Sit - Progress: Goal set today Pt will  Ambulate: >150 feet;with supervision;with rolling walker PT Goal: Ambulate - Progress: Goal set today  Visit Information   Last PT Received On: 08/11/12    Subjective Data  Subjective: husband is very concerned that pt is not walking any more due to weakness Patient Stated Goal: wife would like to be able to go up the stairs in her home   Prior Functioning  Home Living Lives With: Spouse Available Help at Discharge: Family;Available 24 hours/day Type of Home: House Home Access: Ramped entrance Home Layout: One level Bathroom Shower/Tub: Tub/shower unit Home Adaptive Equipment: Grab bars in shower;Shower chair with back;Quad cane Prior Function Level of Independence: Needs assistance Needs Assistance: Light Housekeeping;Meal Prep Meal Prep: Moderate Light Housekeeping: Maximal Able to Take Stairs?: No Driving: No Vocation: Retired Musician: No difficulties    Copywriter, advertising Arousal/Alertness: Awake/alert Behavior During Therapy: WFL for tasks assessed/performed Overall Cognitive Status: Within Functional Limits for tasks assessed    Extremity/Trunk Assessment Right Lower Extremity Assessment RLE ROM/Strength/Tone: Deficits RLE ROM/Strength/Tone Deficits: strength at hip =3-/5, knee =3/5, ankle =2/5 RLE Sensation: WFL - Light Touch Left Lower Extremity Assessment LLE ROM/Strength/Tone: Deficits LLE ROM/Strength/Tone Deficits: strength generally 3/5 throughout LLE Sensation: WFL - Light Touch LLE Coordination: WFL - gross motor Trunk Assessment Trunk Assessment: Kyphotic   Balance Balance Balance Assessed: No  End of Session PT - End of Session Equipment Utilized During Treatment: Gait belt Activity Tolerance: Patient tolerated treatment well Patient left: in chair;with call bell/phone within reach;with family/visitor present Nurse Communication: Mobility status  GP     Konrad Penta 08/11/2012, 3:56 PM

## 2012-08-11 NOTE — Progress Notes (Signed)
CRITICAL VALUE ALERT  Critical value received:  Gram negative rods  Date of notification:  5.27.14  Time of notification:  1910  Critical value read back:yes  Nurse who received alert:  Blair Heys  MD notified (1st page):  Rito Ehrlich  Time of first page:  1920  MD notified (2nd page):  Time of second page:  Responding MD:  N/A  Time MD responded:  N/A  Reported value to night nurse, paged MD to night nurses phone.

## 2012-08-11 NOTE — ED Notes (Signed)
Burning with urination, fever for 3 days

## 2012-08-11 NOTE — Progress Notes (Signed)
Report given to [facility], RN. Patient being transferred to department 300. Patient alert, oriented and in stable condition at the time of transfer. Patient being transported in wheelchair by NT. Family at patient's side during transport. Patient's belongings transported with patient to room 216.

## 2012-08-11 NOTE — Progress Notes (Signed)
The patient was admitted early this morning for treatment of acute pyelonephritis. She was briefly seen. Her vital signs and laboratory studies were reviewed. She says that she is doing much better. We'll continue IV fluid hydration, antibiotics, and supportive treatment.

## 2012-08-11 NOTE — H&P (Signed)
[facility] History and Physical  Cynthia Marks OZH:[id number] DOB: 03/07/1933 DOA: 08/11/2012   PCP: Lilyan Punt, MD   Chief Complaint: Dysuria, right flank pain, nausea since Saturday  HPI: Cynthia Marks is a 79 y.o. female with a past medical history of diabetes, hypertension, hypothyroidism, who had a right ankle fracture recently and is getting physical therapy for the same. Cynthia Marks was in her usual state of health till Saturday, when Cynthia Marks started noticing pain in the lower part of her abdomen, along with the burning sensation with urination. Cynthia Marks took over-the-counter AZO, without any relief. Cynthia Marks had fever at home with chills. Cynthia Marks had nausea without any vomiting. Cynthia Marks also had pain in the right back area. The pain in the abdomen was 5/10 in intensity. Was aching pain. There was no precipitating, aggravating or relieving factors. No radiation of the pain. Denies any blood in the urine. Has had poor appetite. Denies any dizziness or lightheadedness. Cynthia Marks usually does not get UTIs frequently. Her last UTI was 10 years ago.  Home Medications: Prior to Admission medications   Medication Sig Start Date End Date Taking? Authorizing Provider  acetaminophen (TYLENOL) 650 MG CR tablet Take 650 mg by mouth every 8 (eight) hours as needed. pain    Historical Provider, MD  calcium-vitamin D (CALCIUM 500+D) 500-200 MG-UNIT per tablet Take 1 tablet by mouth daily.    Historical Provider, MD  carvedilol (COREG) 12.5 MG tablet Take 12.5 mg by mouth 2 (two) times daily.    Historical Provider, MD  ezetimibe (ZETIA) 10 MG tablet Take 10 mg by mouth daily.    Historical Provider, MD  hydrochlorothiazide (HYDRODIURIL) 25 MG tablet Take 25 mg by mouth daily.    Historical Provider, MD  levothyroxine (SYNTHROID, LEVOTHROID) 150 MCG tablet TAKE TWO TABLETS DAILY 07/22/12   Babs Sciara, MD  losartan (COZAAR) 100 MG tablet Take 100 mg by mouth daily.    Historical Provider, MD  oxyCODONE-acetaminophen  (PERCOCET/ROXICET) 5-325 MG per tablet Take 1 tablet by mouth every 6 (six) hours as needed for pain. 12/04/11   Charles B. Bernette Mayers, MD  pioglitazone (ACTOS) 15 MG tablet Take 15 mg by mouth daily.    Historical Provider, MD  rosuvastatin (CRESTOR) 10 MG tablet Take 10 mg by mouth daily.    Historical Provider, MD  sitaGLIPtin (JANUVIA) 100 MG tablet Take 100 mg by mouth daily.    Historical Provider, MD  traMADol (ULTRAM) 50 MG tablet Take 50 mg by mouth 4 (four) times daily.    Historical Provider, MD    Allergies:  Allergies  Allergen Reactions  . Aspirin   . Ibuprofen     Nausea or HTN  . Penicillins     Past Medical History: Past Medical History  Diagnosis Date  . Diabetes mellitus   . Thyroid disease   . Hypertension   . Macular degeneration   . Arthritis     Knees    Past Surgical History  Procedure Laterality Date  . Abdominal hysterectomy    . Shoulder surgery    . Colonoscopy      Social History:  reports that Cynthia Marks has never smoked. Cynthia Marks does not have any smokeless tobacco history on file. Cynthia Marks reports that Cynthia Marks does not drink alcohol or use illicit drugs.  Living Situation: Lives with her husband Activity Level: Has a brace for her right knee, which is causing some problems and, so, Cynthia Marks is using a walker/cane to ambulate   Family History:  Family History  Problem Relation Age of Onset  . Heart disease    . Arthritis    . Cancer    . Diabetes    . Kidney disease       Review of Systems - History obtained from the patient General ROS: positive for  - fatigue Psychological ROS: negative Ophthalmic ROS: negative ENT ROS: negative Allergy and Immunology ROS: negative Hematological and Lymphatic ROS: negative Endocrine ROS: negative Respiratory ROS: no cough, shortness of breath, or wheezing Cardiovascular ROS: no chest pain or dyspnea on exertion Gastrointestinal ROS: As in history of present illness Genito-Urinary ROS: positive for -  dysuria Musculoskeletal ROS: negative Neurological ROS: no TIA or stroke symptoms Dermatological ROS: negative  Physical Examination  Filed Vitals:   08/11/12 0219 08/11/12 0316 08/11/12 0400 08/11/12 0402  BP: 134/49 140/43 126/39   Pulse: 90 89 83   Temp: 103 F (39.4 C)   101.8 F (38.8 C)  TempSrc: Oral   Oral  Resp: 20     Height: 5\' 8"  (1.727 m)     Weight: 124.739 kg (275 lb)     SpO2: 92% 92% 93%     General appearance: alert, cooperative, appears stated age and no distress Head: Normocephalic, without obvious abnormality, atraumatic Eyes: conjunctivae/corneas clear. PERRL, EOM's intact. Throat: dry mm Neck: no adenopathy, no carotid bruit, no JVD, supple, symmetrical, trachea midline and thyroid not enlarged, symmetric, no tenderness/mass/nodules Back: symmetric, no curvature. ROM normal. No CVA tenderness. Resp: clear to auscultation bilaterally Cardio: S1-S2 is normal. Regular. Systolic murmur appreciated over the apex, as well as the over the aortic area, 3/6. No S3, S4. No rubs, or bruits. No pedal edema. GI: Abdomen is soft. There is tenderness over the suprapubic area without any rebound, rigidity, or guarding. No masses, organomegaly, history Cynthia Marks did. No CVA tenderness was noted. Bowel sounds are present. Extremities: extremities normal, atraumatic, no cyanosis or edema Pulses: 2+ and symmetric Skin: Skin color, texture, turgor normal. No rashes or lesions Lymph nodes: Cervical, supraclavicular, and axillary nodes normal. Neurologic: Cynthia Marks is alert and oriented x3. No focal neurological deficits are present.  Laboratory Data: Results for orders placed during the hospital encounter of 08/11/12 (from the past 48 hour(s))  URINALYSIS, ROUTINE W REFLEX MICROSCOPIC     Status: Abnormal   Collection Time    08/11/12  2:37 AM      Result Value Range   Color, Urine YELLOW  YELLOW   APPearance CLOUDY (*) CLEAR   Specific Gravity, Urine >1.030 (*) 1.005 - 1.030   pH  5.5  5.0 - 8.0   Glucose, UA 100 (*) NEGATIVE mg/dL   Hgb urine dipstick LARGE (*) NEGATIVE   Bilirubin Urine NEGATIVE  NEGATIVE   Ketones, ur TRACE (*) NEGATIVE mg/dL   Protein, ur >161 (*) NEGATIVE mg/dL   Urobilinogen, UA 4.0 (*) 0.0 - 1.0 mg/dL   Nitrite POSITIVE (*) NEGATIVE   Leukocytes, UA LARGE (*) NEGATIVE  CBC WITH DIFFERENTIAL     Status: Abnormal   Collection Time    08/11/12  2:37 AM      Result Value Range   WBC 14.2 (*) 4.0 - 10.5 K/uL   RBC 3.70 (*) 3.87 - 5.11 MIL/uL   Hemoglobin 11.8 (*) 12.0 - 15.0 g/dL   HCT 09.6 (*) 04.5 - 40.9 %   MCV 95.1  78.0 - 100.0 fL   MCH 31.9  26.0 - 34.0 pg   MCHC 33.5  30.0 - 36.0 g/dL   RDW  14.6  11.5 - 15.5 %   Platelets 166  150 - 400 K/uL   Neutrophils Relative % 85 (*) 43 - 77 %   Neutro Abs 12.1 (*) 1.7 - 7.7 K/uL   Lymphocytes Relative 4 (*) 12 - 46 %   Lymphs Abs 0.6 (*) 0.7 - 4.0 K/uL   Monocytes Relative 10  3 - 12 %   Monocytes Absolute 1.4 (*) 0.1 - 1.0 K/uL   Eosinophils Relative 0  0 - 5 %   Eosinophils Absolute 0.0  0.0 - 0.7 K/uL   Basophils Relative 0  0 - 1 %   Basophils Absolute 0.0  0.0 - 0.1 K/uL  COMPREHENSIVE METABOLIC PANEL     Status: Abnormal   Collection Time    08/11/12  2:37 AM      Result Value Range   Sodium 132 (*) 135 - 145 mEq/L   Potassium 4.3  3.5 - 5.1 mEq/L   Chloride 98  96 - 112 mEq/L   CO2 24  19 - 32 mEq/L   Glucose, Bld 144 (*) 70 - 99 mg/dL   BUN 30 (*) 6 - 23 mg/dL   Creatinine, Ser 4.78 (*) 0.50 - 1.10 mg/dL   Calcium 29.5 (*) 8.4 - 10.5 mg/dL   Total Protein 7.4  6.0 - 8.3 g/dL   Albumin 3.6  3.5 - 5.2 g/dL   AST 36  0 - 37 U/L   ALT 12  0 - 35 U/L   Alkaline Phosphatase 72  39 - 117 U/L   Total Bilirubin 0.6  0.3 - 1.2 mg/dL   GFR calc non Af Amer 36 (*) >90 mL/min   GFR calc Af Amer 42 (*) >90 mL/min   Comment:            The eGFR has been calculated     using the CKD EPI equation.     This calculation has not been     validated in all clinical     situations.      eGFR's persistently     <90 mL/min signify     possible Chronic Kidney Disease.  URINE MICROSCOPIC-ADD ON     Status: Abnormal   Collection Time    08/11/12  2:37 AM      Result Value Range   WBC, UA TOO NUMEROUS TO COUNT  <3 WBC/hpf   RBC / HPF 21-50  <3 RBC/hpf   Bacteria, UA MANY (*) RARE  GLUCOSE, CAPILLARY     Status: Abnormal   Collection Time    08/11/12  2:48 AM      Result Value Range   Glucose-Capillary 140 (*) 70 - 99 mg/dL    Radiology Reports: No results found.   Problem List  Principal Problem:   Acute pyelonephritis Active Problems:   Dehydration   Hyponatremia   ARF (acute renal failure)   DM type 2 (diabetes mellitus, type 2)   HTN (hypertension)   Hypothyroidism   Assessment: This is a 79 year old, Caucasian female, who is obese, who present with dysuria and flank pain, and has evidence for acute pyelonephritis. Cynthia Marks's also dehydrated, with mild acute renal failure.  Plan: #1 acute pyelonephritis: Cynthia Marks'll be treated with Levaquin due to penicillin allergy. Urine cultures will be followed up on. Analgesic agents will be provided. Antiemetics as needed.  #2 dehydration with mild acute renal failure and hyponatremia: Cynthia Marks'll be given IV fluids. Renal function will be monitored closely. Avoid any nephrotoxic agents.  #3  diabetes, type II: Utilize sliding scale. Check HbA1c.  #4 history of hypertension: Monitor blood pressure closely. Resume her beta blocker. Hold her ARB as well as diuretic.  #5 history of hypothyroidism: Continue with Synthroid. Dose will need to be verified.  #6 Cynthia Marks has mild hypercalcemia, likely due to dehydration. Should correct with hydration.   DVT Prophylaxis: Heparin Code Status: Full code Family Communication: Discussed with the patient  Disposition Plan: Admit to MedSurg   Further management decisions will depend on results of further testing and patient's response to treatment.  [facility]  [facility] Pager [phone]  If 7PM-7AM, please contact night-coverage [email or web address] Password [facility]  08/11/2012, 4:08 AM

## 2012-08-12 DIAGNOSIS — R7881 Bacteremia: Principal | ICD-10-CM

## 2012-08-12 LAB — URINE CULTURE

## 2012-08-12 LAB — COMPREHENSIVE METABOLIC PANEL
Albumin: 2.9 g/dL — ABNORMAL LOW (ref 3.5–5.2)
Alkaline Phosphatase: 58 U/L (ref 39–117)
BUN: 30 mg/dL — ABNORMAL HIGH (ref 6–23)
Calcium: 10.5 mg/dL (ref 8.4–10.5)
Creatinine, Ser: 1.35 mg/dL — ABNORMAL HIGH (ref 0.50–1.10)
GFR calc Af Amer: 42 mL/min — ABNORMAL LOW (ref >90)
Potassium: 4.4 mEq/L (ref 3.5–5.1)
Total Protein: 6.4 g/dL (ref 6.0–8.3)

## 2012-08-12 LAB — GLUCOSE, CAPILLARY
Glucose-Capillary: 82 mg/dL (ref 70–99)
Glucose-Capillary: 98 mg/dL (ref 70–99)
Glucose-Capillary: 122 mg/dL — ABNORMAL HIGH (ref 70–99)

## 2012-08-12 LAB — CBC
HCT: 31.5 % — ABNORMAL LOW (ref 36.0–46.0)
Hemoglobin: 10.4 g/dL — ABNORMAL LOW (ref 12.0–15.0)
MCV: 96.3 fL (ref 78.0–100.0)
RBC: 3.27 MIL/uL — ABNORMAL LOW (ref 3.87–5.11)
WBC: 9.5 10*3/uL (ref 4.0–10.5)

## 2012-08-12 NOTE — Progress Notes (Signed)
[facility] PROGRESS NOTE  Cynthia Marks ZOX:[id number] DOB: 02-20-1933 DOA: 08/11/2012 PCP: Lilyan Punt, MD  Assessment/Plan: 1. Acute pyelonephritis with gram-negative rod bacteremia: Leukocytosis resolved though still febrile. Followup urine and blood cultures. 2. Dehydration with possible mild acute renal failure and hyponatremia: No significant change in BUN or creatinine. Hyponatremia resolved. Suspect chronic kidney disease. Followup as an outpatient. 3. Hypercalcemia: Minimal. Resolved with hydration. 4. Normocytic anemia: No history of blood loss. Suspect chronic. Followup as an outpatient. 5. Diabetes mellitus: Stable. Hemoglobin A1c 5.6. Continue sliding scale insulin. 6. Hypothyroidism: TSH mildly elevated, however free T4 normal. 7. Hypertension: Stable.   Continue empiric antibiotics. Followup blood and urine cultures.  Check CBC in the morning  Likely home 5/29  Code Status: Full code DVT prophylaxis: heparin Family Communication: None present Disposition Plan: Likely home 24 hours.  Brendia Sacks, MD  [facility]  Pager [phone] If 7PM-7AM, please contact night-coverage at [email or web address], password [facility] 08/12/2012, 8:22 AM  LOS: 1 day   Clinical Summary: 79 year old woman presented to the emergency department with history of lower abdominal pain, right flank pain and burning sensation with urination, fever and chills. Admitted for acute pyelonephritis, dehydration with mild acute renal failure, hyponatremia.  Consultants:  Physical therapy: Home health (right ankle fracture prior to admission). Rolling walker.  Procedures:    Antibiotics:  Ceftriaxone 5/27 >> 5/27  Levaquin 5/27 >>  HPI/Subjective: Tmax 103. Remainder of vitals stable. Urine output only partially recorded. Blood culture positive for gram-negative rods. Overall feels better. No nausea or vomiting. Tolerating diet. Abdominal and flank pain has  resolved.  Objective: Filed Vitals:   08/11/12 2128 08/11/12 2227 08/11/12 2300 08/12/12 0500  BP: 166/78   140/50  Pulse: 83   67  Temp: 103 F (39.4 C) 102 F (38.9 C) 99.2 F (37.3 C) 98.7 F (37.1 C)  TempSrc: Oral   Oral  Resp: 20   20  Height:      Weight:      SpO2:    97%    Intake/Output Summary (Last 24 hours) at 08/12/12 0822 Last data filed at 08/12/12 0549  Gross per 24 hour  Intake 2182.92 ml  Output    300 ml  Net 1882.92 ml     Filed Weights   08/11/12 0219 08/11/12 0526  Weight: 124.739 kg (275 lb) 122.9 kg (270 lb 15.1 oz)    Exam:  General:  Appears calm and comfortable Cardiovascular: RRR, no m/r/g. 1+ bilateral LE edema. Respiratory: CTA bilaterally, no w/r/r. Normal respiratory effort. Abdomen: soft, ntnd, obese Psychiatric: grossly normal mood and affect, speech fluent and appropriate  Data Reviewed:  TSH, free T4, hemoglobin A1c, CMP, CBC. Urine culture with Escherichia coli, sensitivities pending. Blood cultures 1/2 positive for gram-negative rods. MRSA PCR screening negative.  Studies:  No imaging studies since admission  Scheduled Meds: . atorvastatin  20 mg Oral q1800  . carvedilol  12.5 mg Oral BID  . heparin  5,000 Units Subcutaneous Q8H  . insulin aspart  0-15 Units Subcutaneous TID WC  . insulin aspart  0-5 Units Subcutaneous QHS  . levofloxacin (LEVAQUIN) IV  500 mg Intravenous Q24H  . levothyroxine  150 mcg Oral QAC breakfast   Continuous Infusions: . 0.9 % NaCl with KCl 20 mEq / L 75 mL/hr at 08/11/12 2139    Principal Problem:   Acute pyelonephritis Active Problems:   Dehydration   Hyponatremia   ARF (acute renal failure)   DM type 2 (diabetes mellitus, type  2)   HTN (hypertension)   Hypothyroidism   Time spent 20 minutes

## 2012-08-12 NOTE — Progress Notes (Signed)
UR Chart Review Completed  

## 2012-08-12 NOTE — Progress Notes (Signed)
Physical Therapy Treatment Patient Details Name: Cynthia Marks MRN: [id number] DOB: 1933/01/20 Today's Date: 08/12/2012 Time: 0830-0910 PT Time Calculation (min): 30 min  PT Assessment / Plan / Recommendation Comments on Treatment Session   Pt doing much better today.  Tolerance to activity has improved.    Follow Up Recommendations  Home health PT     Does the patient have the potential to tolerate intense rehabilitation   N/A  Barriers to Discharge  none      Equipment Recommendations  Rolling walker with 5" wheels    Recommendations for Other Services  none     Plan Discharge plan remains appropriate    Precautions / Restrictions Restrictions Weight Bearing Restrictions: No RLE Weight Bearing: Weight bearing as tolerated   Pertinent Vitals/Pain none    Mobility  Bed Mobility Supine to Sit: 4: Min assist Transfers Sit to Stand: 5: Supervision Stand to Sit: 7: Independent Ambulation/Gait Ambulation/Gait Assistance: 5: Supervision Ambulation Distance (Feet): 200 Feet Assistive device: Rolling walker Gait Pattern: Within Functional Limits    Exercises General Exercises - Lower Extremity Long Arc Quad: 10 reps Hip ABduction/ADduction: 10 reps Straight Leg Raises: 10 reps Hip Flexion/Marching: 10 reps;Standing Mini-Sqauts: 5 reps   PT Diagnosis:   weakness PT Problem List:  difficulty walking PT Treatment Interventions:   there ex/ gt.  PT Goals Acute Rehab PT Goals PT Goal Formulation: With patient PT Goal: Supine/Side to Sit - Progress: Met PT Goal: Sit to Stand - Progress: Met PT Goal: Stand to Sit - Progress: Met PT Goal: Ambulate - Progress: Not met  Visit Information  Last PT Received On: 08/12/12    Subjective Data  Subjective: Pt states that she feels much better today.   Cognition    wnl   Balance   4/5  End of Session PT - End of Session Equipment Utilized During Treatment: Gait belt Activity Tolerance: Patient tolerated treatment  well Patient left: in chair;with call bell/phone within reach   GP     Cynthia Marks 08/12/2012, 9:10 AM

## 2012-08-13 ENCOUNTER — Ambulatory Visit: Payer: Self-pay | Admitting: Family Medicine

## 2012-08-13 DIAGNOSIS — R7881 Bacteremia: Principal | ICD-10-CM

## 2012-08-13 LAB — CBC
HCT: 32.7 % — ABNORMAL LOW (ref 36.0–46.0)
MCH: 31.4 pg (ref 26.0–34.0)
MCV: 95.9 fL (ref 78.0–100.0)
Platelets: 163 10*3/uL (ref 150–400)
RBC: 3.41 MIL/uL — ABNORMAL LOW (ref 3.87–5.11)

## 2012-08-13 LAB — BASIC METABOLIC PANEL
BUN: 22 mg/dL (ref 6–23)
CO2: 23 mEq/L (ref 19–32)
Calcium: 10.4 mg/dL (ref 8.4–10.5)
Chloride: 105 mEq/L (ref 96–112)
Creatinine, Ser: 1.15 mg/dL — ABNORMAL HIGH (ref 0.50–1.10)
Glucose, Bld: 115 mg/dL — ABNORMAL HIGH (ref 70–99)

## 2012-08-13 LAB — GLUCOSE, CAPILLARY: Glucose-Capillary: 96 mg/dL (ref 70–99)

## 2012-08-13 MED ORDER — LEVOFLOXACIN 750 MG PO TABS: 750.0000 mg | ORAL_TABLET | Freq: Every day | ORAL | Status: DC

## 2012-08-13 MED ORDER — HYDROCODONE-ACETAMINOPHEN 5-325 MG PO TABS: 1.0000 | ORAL_TABLET | Freq: Four times a day (QID) | ORAL | Status: DC | PRN

## 2012-08-13 NOTE — Progress Notes (Signed)
[facility] PROGRESS NOTE  GAURI GALVAO ZOX:[id number] DOB: Jun 06, 1932 DOA: 08/11/2012 PCP: Lilyan Punt, MD  Assessment/Plan: 1. E coli acute pyelonephritis with E coli bacteremia: Leukocytosis resolved, afebrile. Change to oral antibiotics. 2. Dehydration with acute renal failure and hyponatremia: Nearly resolved, expect spontaneous resolution as an outpatient. Hyponatremia resolved.  3. Hypercalcemia: Minimal. Resolved with hydration. 4. Normocytic anemia: No history of blood loss. Suspect chronic. Followup as an outpatient. 5. Diabetes mellitus: Stable. Hemoglobin A1c 5.6. Continue sliding scale insulin. 6. Hypothyroidism: TSH mildly elevated, however free T4 normal. 7. Hypertension: Stable.   Change to oral Levaquin  Home today with home health physical therapy, rolling walker  Discussed with husband at bedside  Code Status: Full code DVT prophylaxis: heparin Family Communication: None present Disposition Plan: Likely home 24 hours.  Brendia Sacks, MD  [facility]  Pager [phone] If 7PM-7AM, please contact night-coverage at [email or web address], password [facility] 08/13/2012, 12:57 PM  LOS: 2 days   Clinical Summary: 79 year old woman presented to the emergency department with history of lower abdominal pain, right flank pain and burning sensation with urination, fever and chills. Admitted for acute pyelonephritis, dehydration with mild acute renal failure, hyponatremia.  Consultants:  Physical therapy: Home health (right ankle fracture prior to admission). Rolling walker.  Procedures:    Antibiotics:  Ceftriaxone 5/27 >> 5/27  Levaquin 5/27 >> 6/5  HPI/Subjective: Now afebrile greater than 24 hours. Hemodynamically stable. No hypoxia. Good urine output. She feels well. No pain now. Eating well. No nausea or vomiting. She would like to go home.  Objective: Filed Vitals:   08/12/12 1400 08/12/12 2304 08/13/12 0519 08/13/12 1100  BP: 111/46 150/67  153/66 115/72  Pulse: 63 71 73 61  Temp: 98.7 F (37.1 C) 99.7 F (37.6 C) 98.7 F (37.1 C)   TempSrc: Oral  Oral   Resp: 20 20 18    Height:      Weight:      SpO2: 98% 95% 97%     Intake/Output Summary (Last 24 hours) at 08/13/12 1257 Last data filed at 08/13/12 0800  Gross per 24 hour  Intake   2705 ml  Output   1450 ml  Net   1255 ml     Filed Weights   08/11/12 0219 08/11/12 0526  Weight: 124.739 kg (275 lb) 122.9 kg (270 lb 15.1 oz)    Exam:  General:  Appears calm and comfortable Cardiovascular: RRR, no m/r/g.  Respiratory: CTA bilaterally, no w/r/r. Normal respiratory effort. Abdomen: soft, ntnd, obese Psychiatric: grossly normal mood and affect, speech fluent and appropriate  Data Reviewed:  BUN now normal at 22, creatinine are normal 1.15. Hemoglobin stable 10.7. Normal white blood cell count.  Scheduled Meds: . atorvastatin  20 mg Oral q1800  . carvedilol  12.5 mg Oral BID  . heparin  5,000 Units Subcutaneous Q8H  . insulin aspart  0-15 Units Subcutaneous TID WC  . insulin aspart  0-5 Units Subcutaneous QHS  . [START ON 08/14/2012] levofloxacin  750 mg Oral Daily  . levothyroxine  150 mcg Oral QAC breakfast   Continuous Infusions: . 0.9 % NaCl with KCl 20 mEq / L 75 mL/hr at 08/13/12 0800    Principal Problem:   Acute pyelonephritis Active Problems:   Dehydration   Hyponatremia   ARF (acute renal failure)   DM type 2 (diabetes mellitus, type 2)   HTN (hypertension)   Hypothyroidism  , Afebrile

## 2012-08-13 NOTE — Progress Notes (Signed)
Pt education on urinary tract infections and symptoms was performed prior to discharge. Pt verbally acknowledged that she understood and signed AVS form. Pt left via wheelchair with personal belongings including a new walker provided to her by PT and family was at bedside. Follow appt with Dr. Lilyan Punt was made for 08-24-12 at 1030.

## 2012-08-13 NOTE — Care Management Note (Signed)
    Page 1 of 2   08/13/2012     1:58:09 PM   CARE MANAGEMENT NOTE 08/13/2012  Patient:  Cynthia Marks, Cynthia Marks   Account Number:  [id number]  Date Initiated:  08/13/2012  Documentation initiated by:  Sharrie Rothman  Subjective/Objective Assessment:   Pt admitted from home with pyelonephritis. Pt lives with her husband and will return home at discharge. Pt is able to do most ADL's for herself.     Action/Plan:   PT recommends HH PT and rolling walker. Pt chose [facility] for PT and Alroy Bailiff of [facility] is aware and will collect the pts information. Tula Nakayama of [facility] will deliver RW to pts room prior to D/C. HH services to start within 48 hours.   Anticipated DC Date:  08/13/2012   Anticipated DC Plan:  HOME W HOME HEALTH SERVICES      DC Planning Services  CM consult      PAC Choice  DURABLE MEDICAL EQUIPMENT  HOME HEALTH   Choice offered to / List presented to:  C-1 Patient   DME arranged  [facility]st      DME agency  [facility]     HH arranged  HH-2 PT      [facility] agency  [facility]   Status of service:  Completed, signed off Medicare Important Message given?  NA - LOS <3 / Initial given by admissions (If response is "NO", the following Medicare IM given date fields will be blank) Date Medicare IM given:   Date Additional Medicare IM given:    Discharge Disposition:  HOME W HOME HEALTH SERVICES  Per UR Regulation:    If discussed at Long Length of Stay Meetings, dates discussed:    Comments:  08/13/12 1400 Arlyss Queen, RN BSN CM

## 2012-08-13 NOTE — Progress Notes (Signed)
PHARMACIST - PHYSICIAN COMMUNICATION DR:   Irene Limbo CONCERNING: Antibiotic IV to Oral Route Change Policy  RECOMMENDATION: This patient is receiving Levaquin by the intravenous route.  Based on criteria approved by the Pharmacy and Therapeutics Committee, the antibiotic(s) is/are being converted to the equivalent oral dose form(s).   DESCRIPTION: These criteria include:  Patient being treated for a respiratory tract infection, urinary tract infection, or cellulitis  The patient is not neutropenic and does not exhibit a GI malabsorption state  The patient is eating (either orally or via tube) and/or has been taking other orally administered medications for a least 24 hours  The patient is improving clinically and has a Tmax < 100.5  If you have questions about this conversion, please contact the Pharmacy Department  [x]   [phone] )  [facility] []   [phone] )  [facility]  []   [phone] )  [facility] []   [phone] )  [facility]    S. Margo Aye, PharmD

## 2012-08-13 NOTE — Discharge Summary (Addendum)
Physician Discharge Summary  Cynthia Marks WUJ:[id number] DOB: Mar 23, 1932 DOA: 08/11/2012  PCP: Lilyan Punt, MD  Admit date: 08/11/2012 Discharge date: 08/13/2012  Recommendations for Outpatient Follow-up:  1. Followup resolution of pyelonephritis and bacteremia 2. Followup final blood culture 3. Followup normocytic anemia as clinically indicated 4. Home health physical therapy, rolling walker   Follow-up Information   Follow up with LUKING,SCOTT, MD. Schedule an appointment as soon as possible for a visit in 1 week.   Contact information:   [address] [place] [address] [phone]      Discharge Diagnoses:  1. Escherichia coli acute pyelonephritis with Escherichia coli bacteremia 2. Dehydration and acute renal failure and hypernatremia 3. Normocytic anemia  Discharge Condition: Improved Disposition: Home with home health physical therapy  Diet recommendation: Heart healthy, diabetic diet  Filed Weights   08/11/12 0219 08/11/12 0526  Weight: 124.739 kg (275 lb) 122.9 kg (270 lb 15.1 oz)    History of present illness:  79 year old woman presented to the emergency department with history of lower abdominal pain, right flank pain and burning sensation with urination, fever and chills. Admitted for acute pyelonephritis, dehydration with mild acute renal failure, hyponatremia.  Hospital Course:  Ms. Lodwick was admitted for further treatment of acute nephritis, started on empiric antibiotic therapy. She gradually improved and antibiotic therapy was tailored to culture sensitivities. Acute renal failure resolved with IV fluids. She feels quite well, is afebrile and is stable for discharge. Other issues as outlined below.  1. E coli acute pyelonephritis with E coli bacteremia: Leukocytosis resolved, afebrile. Complete antibiotics as an outpatient. 2. Dehydration with acute renal failure and hyponatremia: Nearly resolved, expect spontaneous resolution as an  outpatient. Hyponatremia resolved.  3. Hypercalcemia: Minimal. Resolved with hydration. 4. Normocytic anemia: No history of blood loss. Suspect chronic. Followup as an outpatient as clinically indicated. 5. Diabetes mellitus: Stable. Hemoglobin A1c 5.6. 6. Hypothyroidism: TSH mildly elevated, however free T4 normal. 7. Hypertension: Stable.   Consultants:  Physical therapy: Home health (right ankle fracture prior to admission). Rolling walker.  Procedures: None  Antibiotics:  Ceftriaxone 5/27 >> 5/27  Levaquin 5/27 >> 6/5  Discharge Instructions  Discharge Orders   Future Orders Complete By Expires     Diet - low sodium heart healthy  As directed     Diet Carb Modified  As directed     Discharge instructions  As directed     Comments:      Be sure to complete Levaquin (antibiotic) for bladder infection. Call your physician or seek immediate medical attention for recurrent fever, pain or worsening of condition.    Face-to-face encounter (required for Medicare/Medicaid patients)  As directed     Comments:      I Hermen Mario certify that this patient is under my care and that I, or a nurse practitioner or physician's assistant working with me, had a face-to-face encounter that meets the physician face-to-face encounter requirements with this patient on 08/13/2012. The encounter with the patient was in whole, or in part for the following medical condition(s) which is the primary reason for home health care (List medical condition): Acute pyelonephritis, bacteremia    Questions:      The encounter with the patient was in whole, or in part, for the following medical condition, which is the primary reason for home health care:  Acute pyelonephritis with bacteremia    I certify that, based on my findings, the following services are medically necessary home health services:  Physical therapy    My clinical findings support the need for the above services:  Pain interferes with  ambulation/mobility    Further, I certify that my clinical findings support that this patient is homebound due to:  Pain interferes with ambulation/mobility    Reason for Medically Necessary Home Health Services:  Therapy- Instruction on use of Assistive Device for Ambulation on all Surfaces    For home use only DME Walker rolling  As directed     Home Health  As directed     Questions:      To provide the following care/treatments:  PT    Increase activity slowly  As directed         Medication List    TAKE these medications       acetaminophen 650 MG CR tablet  Commonly known as:  TYLENOL  Take 650 mg by mouth every 8 (eight) hours as needed. pain     beta carotene w/minerals tablet  Take 1 tablet by mouth daily.     CALCIUM 500+D 500-200 MG-UNIT per tablet  Generic drug:  calcium-vitamin D  Take 1 tablet by mouth daily.     carvedilol 12.5 MG tablet  Commonly known as:  COREG  Take 12.5 mg by mouth 2 (two) times daily.     hydrochlorothiazide 25 MG tablet  Commonly known as:  HYDRODIURIL  Take 25 mg by mouth daily.     HYDROcodone-acetaminophen 5-325 MG per tablet  Commonly known as:  NORCO/VICODIN  Take 1 tablet by mouth every 6 (six) hours as needed (moderate pain). Total acetaminophen limited to 3 g per day.     levofloxacin 750 MG tablet  Commonly known as:  LEVAQUIN  Take 1 tablet (750 mg total) by mouth daily. Start 5/30 in the morning.  Start taking on:  08/14/2012     levothyroxine 150 MCG tablet  Commonly known as:  SYNTHROID, LEVOTHROID  TAKE TWO TABLETS DAILY     pioglitazone 15 MG tablet  Commonly known as:  ACTOS  Take 15 mg by mouth daily.     rosuvastatin 20 MG tablet  Commonly known as:  CRESTOR  Take 20 mg by mouth daily.     sitaGLIPtin 100 MG tablet  Commonly known as:  JANUVIA  Take 100 mg by mouth daily.       Allergies  Allergen Reactions  . Aspirin   . Ibuprofen     Nausea or HTN  . Penicillins     The results of  significant diagnostics from this hospitalization (including imaging, microbiology, ancillary and laboratory) are listed below for reference.    Significant Diagnostic Studies: No results found.  Microbiology: Recent Results (from the past 240 hour(s))  URINE CULTURE     Status: None   Collection Time    08/11/12  2:37 AM      Result Value Range Status   Specimen Description URINE, CATHETERIZED   Final   Special Requests NONE   Final   Culture  Setup Time 08/11/2012 04:15   Final   Colony Count >=100,000 COLONIES/ML   Final   Culture ESCHERICHIA COLI   Final   Report Status 08/12/2012 FINAL   Final   Organism ID, Bacteria ESCHERICHIA COLI   Final  CULTURE, BLOOD (ROUTINE X 2)     Status: None   Collection Time    08/11/12  4:49 AM      Result Value Range Status   Specimen Description BLOOD RIGHT HAND  Final   Special Requests BOTTLES DRAWN AEROBIC AND ANAEROBIC [facility]   Final   Culture  Setup Time 08/12/2012 01:23   Final   Culture     Final   Value: ESCHERICHIA COLI     Note: Gram Stain Report Called to,Read Back By and Verified With: NAGLE E 1910 08/11/12 Ginette Pitman Performed at [facility]   Report Status PENDING   Incomplete  CULTURE, BLOOD (ROUTINE X 2)     Status: None   Collection Time    08/11/12  4:54 AM      Result Value Range Status   Specimen Description BLOOD LEFT HAND   Final   Special Requests BOTTLES DRAWN AEROBIC AND ANAEROBIC 6CC   Final   Culture NO GROWTH 2 DAYS   Final   Report Status PENDING   Incomplete  MRSA PCR SCREENING     Status: None   Collection Time    08/11/12  5:03 AM      Result Value Range Status   MRSA by PCR NEGATIVE  NEGATIVE Final   Comment:            The GeneXpert MRSA Assay (FDA     approved for NASAL specimens     only), is one component of a     comprehensive MRSA colonization     surveillance program. It is not     intended to diagnose MRSA     infection nor to guide or     monitor treatment for     MRSA infections.      Labs: Basic Metabolic Panel:  Recent Labs Lab 08/11/12 0237 08/12/12 0446 08/13/12 0444  NA 132* 136 136  K 4.3 4.4 4.2  CL 98 104 105  CO2 24 23 23   GLUCOSE 144* 102* 115*  BUN 30* 30* 22  CREATININE 1.36* 1.35* 1.15*  CALCIUM 10.7* 10.5 10.4   Liver Function Tests:  Recent Labs Lab 08/11/12 0237 08/12/12 0446  AST 36 43*  ALT 12 15  ALKPHOS 72 58  BILITOT 0.6 0.2*  PROT 7.4 6.4  ALBUMIN 3.6 2.9*   CBC:  Recent Labs Lab 08/11/12 0237 08/12/12 0446 08/13/12 0444  WBC 14.2* 9.5 7.8  NEUTROABS 12.1*  --   --   HGB 11.8* 10.4* 10.7*  HCT 35.2* 31.5* 32.7*  MCV 95.1 96.3 95.9  PLT 166 151 163   CBG:  Recent Labs Lab 08/12/12 1116 08/12/12 1638 08/12/12 2131 08/13/12 0721 08/13/12 1119  GLUCAP 118* 98 122* 99 96    Principal Problem:   Acute pyelonephritis Active Problems:   Dehydration   Hyponatremia   ARF (acute renal failure)   DM type 2 (diabetes mellitus, type 2)   HTN (hypertension)   Hypothyroidism   Time coordinating discharge: 25 minutes  Signed:  Brendia Sacks, MD [facility] 08/13/2012, 1:11 PM

## 2012-08-14 LAB — CULTURE, BLOOD (ROUTINE X 2)

## 2012-08-16 LAB — CULTURE, BLOOD (ROUTINE X 2)

## 2012-08-17 ENCOUNTER — Ambulatory Visit: Payer: Medicare Other | Admitting: Family Medicine

## 2012-08-17 DIAGNOSIS — N10 Acute tubulo-interstitial nephritis: Secondary | ICD-10-CM | POA: Diagnosis not present

## 2012-08-17 DIAGNOSIS — E119 Type 2 diabetes mellitus without complications: Secondary | ICD-10-CM | POA: Diagnosis not present

## 2012-08-17 DIAGNOSIS — A498 Other bacterial infections of unspecified site: Secondary | ICD-10-CM | POA: Diagnosis not present

## 2012-08-17 DIAGNOSIS — M1710 Unilateral primary osteoarthritis, unspecified knee: Secondary | ICD-10-CM | POA: Diagnosis not present

## 2012-08-17 DIAGNOSIS — IMO0001 Reserved for inherently not codable concepts without codable children: Secondary | ICD-10-CM | POA: Diagnosis not present

## 2012-08-19 DIAGNOSIS — A498 Other bacterial infections of unspecified site: Secondary | ICD-10-CM | POA: Diagnosis not present

## 2012-08-19 DIAGNOSIS — M1710 Unilateral primary osteoarthritis, unspecified knee: Secondary | ICD-10-CM | POA: Diagnosis not present

## 2012-08-19 DIAGNOSIS — E119 Type 2 diabetes mellitus without complications: Secondary | ICD-10-CM | POA: Diagnosis not present

## 2012-08-19 DIAGNOSIS — IMO0001 Reserved for inherently not codable concepts without codable children: Secondary | ICD-10-CM | POA: Diagnosis not present

## 2012-08-19 DIAGNOSIS — N10 Acute tubulo-interstitial nephritis: Secondary | ICD-10-CM | POA: Diagnosis not present

## 2012-08-21 DIAGNOSIS — M1710 Unilateral primary osteoarthritis, unspecified knee: Secondary | ICD-10-CM | POA: Diagnosis not present

## 2012-08-21 DIAGNOSIS — N10 Acute tubulo-interstitial nephritis: Secondary | ICD-10-CM | POA: Diagnosis not present

## 2012-08-21 DIAGNOSIS — E119 Type 2 diabetes mellitus without complications: Secondary | ICD-10-CM | POA: Diagnosis not present

## 2012-08-21 DIAGNOSIS — IMO0001 Reserved for inherently not codable concepts without codable children: Secondary | ICD-10-CM | POA: Diagnosis not present

## 2012-08-21 DIAGNOSIS — A498 Other bacterial infections of unspecified site: Secondary | ICD-10-CM | POA: Diagnosis not present

## 2012-08-24 ENCOUNTER — Ambulatory Visit (INDEPENDENT_AMBULATORY_CARE_PROVIDER_SITE_OTHER): Payer: Medicare Other | Admitting: Family Medicine

## 2012-08-24 ENCOUNTER — Encounter: Payer: Self-pay | Admitting: Family Medicine

## 2012-08-24 VITALS — BP 129/80 | Temp 98.2°F | Wt 269.0 lb

## 2012-08-24 DIAGNOSIS — N10 Acute tubulo-interstitial nephritis: Secondary | ICD-10-CM | POA: Diagnosis not present

## 2012-08-24 DIAGNOSIS — I10 Essential (primary) hypertension: Secondary | ICD-10-CM

## 2012-08-24 DIAGNOSIS — E039 Hypothyroidism, unspecified: Secondary | ICD-10-CM

## 2012-08-24 DIAGNOSIS — E119 Type 2 diabetes mellitus without complications: Secondary | ICD-10-CM | POA: Diagnosis not present

## 2012-08-24 DIAGNOSIS — IMO0001 Reserved for inherently not codable concepts without codable children: Secondary | ICD-10-CM | POA: Diagnosis not present

## 2012-08-24 DIAGNOSIS — A498 Other bacterial infections of unspecified site: Secondary | ICD-10-CM | POA: Diagnosis not present

## 2012-08-24 DIAGNOSIS — M1710 Unilateral primary osteoarthritis, unspecified knee: Secondary | ICD-10-CM | POA: Diagnosis not present

## 2012-08-24 MED ORDER — TRAMADOL HCL 50 MG PO TABS: 50.0000 mg | ORAL_TABLET | Freq: Four times a day (QID) | ORAL | Status: DC | PRN

## 2012-08-24 NOTE — Progress Notes (Signed)
  Subjective:    Patient ID: Cynthia Marks, female    DOB: 1932/04/17, 79 y.o.   MRN: [id number]  HPIPatient unable to give a urine specimen today. Hospital notes and labs were reviewed with patient while she was here. Her medicine list was reviewed as well. She is starting get around better she denies fever chills sweats. Her energy level is improving. She denies any other particular problems. Recently in the hospital for pyelonephritis. Has history of diabetes hypothyroidism obesity and fractured leg. Family history social history all reviewed husband helps take care of her.    Review of Systems Denies headaches chest pain fevers chills abdominal pain vomiting diarrhea swelling in the legs except for where she wears her splint on the right side    Objective:   Physical Exam  Her lungs are clear hearts regular abdomen obese extremities some edema in the right leg none in the left leg skin warm dry      Assessment & Plan:  Recent UTI-to get a urine specimen await the results of this. Diabetes good control Hyperthyroidism recheck TSH along with metabolic 7 for renal insufficiency in 2-3 weeks await the results. Family will bring back a urine specimen were sent for culture Followup office visit 4 months.

## 2012-08-26 ENCOUNTER — Other Ambulatory Visit: Payer: Self-pay | Admitting: Family Medicine

## 2012-08-26 ENCOUNTER — Telehealth: Payer: Self-pay | Admitting: *Deleted

## 2012-08-26 ENCOUNTER — Other Ambulatory Visit: Payer: Self-pay | Admitting: *Deleted

## 2012-08-26 DIAGNOSIS — N10 Acute tubulo-interstitial nephritis: Secondary | ICD-10-CM | POA: Diagnosis not present

## 2012-08-26 DIAGNOSIS — M1710 Unilateral primary osteoarthritis, unspecified knee: Secondary | ICD-10-CM | POA: Diagnosis not present

## 2012-08-26 DIAGNOSIS — E119 Type 2 diabetes mellitus without complications: Secondary | ICD-10-CM | POA: Diagnosis not present

## 2012-08-26 DIAGNOSIS — N390 Urinary tract infection, site not specified: Secondary | ICD-10-CM | POA: Diagnosis not present

## 2012-08-26 DIAGNOSIS — A498 Other bacterial infections of unspecified site: Secondary | ICD-10-CM | POA: Diagnosis not present

## 2012-08-26 DIAGNOSIS — IMO0001 Reserved for inherently not codable concepts without codable children: Secondary | ICD-10-CM | POA: Diagnosis not present

## 2012-08-26 MED ORDER — SULFAMETHOXAZOLE-TRIMETHOPRIM 800-160 MG PO TABS: 1.0000 | ORAL_TABLET | Freq: Two times a day (BID) | ORAL | Status: AC

## 2012-08-26 NOTE — Telephone Encounter (Signed)
Called and left message to return call. Dr. Lorin Picket looked at urine that was dropped off today. He stated he was going to send in an antibioitc and to ask pt what pharm and he would send it in. Also he wanted to do a urine culture. I have already done this.Just need to let pt know.

## 2012-08-27 DIAGNOSIS — IMO0001 Reserved for inherently not codable concepts without codable children: Secondary | ICD-10-CM | POA: Diagnosis not present

## 2012-08-27 DIAGNOSIS — A498 Other bacterial infections of unspecified site: Secondary | ICD-10-CM | POA: Diagnosis not present

## 2012-08-27 DIAGNOSIS — E119 Type 2 diabetes mellitus without complications: Secondary | ICD-10-CM | POA: Diagnosis not present

## 2012-08-27 DIAGNOSIS — N10 Acute tubulo-interstitial nephritis: Secondary | ICD-10-CM | POA: Diagnosis not present

## 2012-08-27 DIAGNOSIS — M1710 Unilateral primary osteoarthritis, unspecified knee: Secondary | ICD-10-CM | POA: Diagnosis not present

## 2012-08-27 NOTE — Telephone Encounter (Signed)
Discussed with patient. Patient stated that she had already picked up antibiotic from pharmacy.

## 2012-08-29 LAB — URINE CULTURE: Colony Count: >=100000

## 2012-08-31 ENCOUNTER — Other Ambulatory Visit: Payer: Self-pay

## 2012-08-31 DIAGNOSIS — N390 Urinary tract infection, site not specified: Secondary | ICD-10-CM

## 2012-09-01 DIAGNOSIS — N10 Acute tubulo-interstitial nephritis: Secondary | ICD-10-CM | POA: Diagnosis not present

## 2012-09-01 DIAGNOSIS — IMO0001 Reserved for inherently not codable concepts without codable children: Secondary | ICD-10-CM | POA: Diagnosis not present

## 2012-09-01 DIAGNOSIS — M1710 Unilateral primary osteoarthritis, unspecified knee: Secondary | ICD-10-CM | POA: Diagnosis not present

## 2012-09-01 DIAGNOSIS — E119 Type 2 diabetes mellitus without complications: Secondary | ICD-10-CM | POA: Diagnosis not present

## 2012-09-01 DIAGNOSIS — A498 Other bacterial infections of unspecified site: Secondary | ICD-10-CM | POA: Diagnosis not present

## 2012-09-03 DIAGNOSIS — N10 Acute tubulo-interstitial nephritis: Secondary | ICD-10-CM | POA: Diagnosis not present

## 2012-09-03 DIAGNOSIS — IMO0001 Reserved for inherently not codable concepts without codable children: Secondary | ICD-10-CM | POA: Diagnosis not present

## 2012-09-03 DIAGNOSIS — A498 Other bacterial infections of unspecified site: Secondary | ICD-10-CM | POA: Diagnosis not present

## 2012-09-03 DIAGNOSIS — M1710 Unilateral primary osteoarthritis, unspecified knee: Secondary | ICD-10-CM | POA: Diagnosis not present

## 2012-09-03 DIAGNOSIS — E119 Type 2 diabetes mellitus without complications: Secondary | ICD-10-CM | POA: Diagnosis not present

## 2012-09-08 DIAGNOSIS — A498 Other bacterial infections of unspecified site: Secondary | ICD-10-CM | POA: Diagnosis not present

## 2012-09-08 DIAGNOSIS — E119 Type 2 diabetes mellitus without complications: Secondary | ICD-10-CM | POA: Diagnosis not present

## 2012-09-08 DIAGNOSIS — M1710 Unilateral primary osteoarthritis, unspecified knee: Secondary | ICD-10-CM | POA: Diagnosis not present

## 2012-09-08 DIAGNOSIS — N10 Acute tubulo-interstitial nephritis: Secondary | ICD-10-CM | POA: Diagnosis not present

## 2012-09-08 DIAGNOSIS — IMO0001 Reserved for inherently not codable concepts without codable children: Secondary | ICD-10-CM | POA: Diagnosis not present

## 2012-09-10 DIAGNOSIS — A498 Other bacterial infections of unspecified site: Secondary | ICD-10-CM | POA: Diagnosis not present

## 2012-09-10 DIAGNOSIS — IMO0001 Reserved for inherently not codable concepts without codable children: Secondary | ICD-10-CM | POA: Diagnosis not present

## 2012-09-10 DIAGNOSIS — M1710 Unilateral primary osteoarthritis, unspecified knee: Secondary | ICD-10-CM | POA: Diagnosis not present

## 2012-09-10 DIAGNOSIS — E119 Type 2 diabetes mellitus without complications: Secondary | ICD-10-CM | POA: Diagnosis not present

## 2012-09-10 DIAGNOSIS — N10 Acute tubulo-interstitial nephritis: Secondary | ICD-10-CM | POA: Diagnosis not present

## 2012-09-12 ENCOUNTER — Other Ambulatory Visit: Payer: Self-pay | Admitting: Family Medicine

## 2012-09-14 DIAGNOSIS — N390 Urinary tract infection, site not specified: Secondary | ICD-10-CM | POA: Diagnosis not present

## 2012-09-14 LAB — URINALYSIS
Bilirubin Urine: NEGATIVE
Hgb urine dipstick: NEGATIVE
Ketones, ur: NEGATIVE mg/dL
Nitrite: NEGATIVE
Specific Gravity, Urine: 1.02 (ref 1.005–1.030)
Urobilinogen, UA: 0.2 mg/dL (ref 0.0–1.0)

## 2012-09-16 DIAGNOSIS — E119 Type 2 diabetes mellitus without complications: Secondary | ICD-10-CM | POA: Diagnosis not present

## 2012-09-16 DIAGNOSIS — A498 Other bacterial infections of unspecified site: Secondary | ICD-10-CM | POA: Diagnosis not present

## 2012-09-16 DIAGNOSIS — IMO0001 Reserved for inherently not codable concepts without codable children: Secondary | ICD-10-CM | POA: Diagnosis not present

## 2012-09-16 DIAGNOSIS — M1710 Unilateral primary osteoarthritis, unspecified knee: Secondary | ICD-10-CM | POA: Diagnosis not present

## 2012-09-16 DIAGNOSIS — N10 Acute tubulo-interstitial nephritis: Secondary | ICD-10-CM | POA: Diagnosis not present

## 2012-09-16 LAB — URINE CULTURE: Colony Count: 90000

## 2012-09-17 NOTE — Progress Notes (Signed)
L[facility]

## 2012-09-23 ENCOUNTER — Encounter: Payer: Self-pay | Admitting: Orthopedic Surgery

## 2012-09-23 ENCOUNTER — Ambulatory Visit (INDEPENDENT_AMBULATORY_CARE_PROVIDER_SITE_OTHER): Payer: Medicare Other | Admitting: Orthopedic Surgery

## 2012-09-23 VITALS — BP 144/84 | Ht 68.0 in | Wt 269.0 lb

## 2012-09-23 DIAGNOSIS — M1710 Unilateral primary osteoarthritis, unspecified knee: Secondary | ICD-10-CM

## 2012-09-23 DIAGNOSIS — M7051 Other bursitis of knee, right knee: Secondary | ICD-10-CM

## 2012-09-23 DIAGNOSIS — M76899 Other specified enthesopathies of unspecified lower limb, excluding foot: Secondary | ICD-10-CM | POA: Diagnosis not present

## 2012-09-23 DIAGNOSIS — M7050 Other bursitis of knee, unspecified knee: Secondary | ICD-10-CM | POA: Insufficient documentation

## 2012-09-23 NOTE — Progress Notes (Signed)
Patient ID: Cynthia Marks, female   DOB: January 11, 1933, 79 y.o.   MRN: [id number] Chief Complaint  Patient presents with  . Knee Pain    Right knee pain, had previous injury 12/04/11, Recheck right ankle    This patient fell in September 2013 fractured her right ankle but because of her diabetes be treated nonoperatively eventually landing her in a double upright brace which is warm and unwell she was recently in the hospital for a kidney infection and had to have home physical therapy which has helped her knee pain. She complains of diffuse knee pain with posterior knee pain medial knee pain joint pain swelling popping. She says that propping is gotten worse. She is on tramadol. She reports locking but when you ask her she is really talking about the popping. She's using a cane in the house and a walker out of the house  Review of systems again she had a bladder infection with painful urination urgency frequency she has some itching of the skin easy bruising seasonal allergies she's had some changes of the skin secondary peripheral vascular disease denies blurred vision chest pain or shortness of breath  She is allergic to aspirin and penicillin ibuprofen and anti-inflammatory drugs  Has a history of diabetes and thyroid disease elevated blood pressure she's had a hysterectomy left carpal tunnel release she is on Januvia carpal tunnel levothyroxine Crestor nitrofurantoin tramadol and pioglitazone  Family history heart disease arthritis lung disease cancer diabetes kidney disease  She's married retired does not smoke or drink  BP 144/84  Ht 5\' 8"  (1.727 m)  Wt 269 lb (122.018 kg)  BMI 40.91 kg/m2 General appearance is normal, the patient is alert and oriented x3 with normal mood and affect. She has a BMI of 40 her body habitus is endomorphic and she has some obesity. She can ambulate with a double upright hinged brace on her right ankle and a walker. Minimal limp. Slow cadence. Short stride  length. She has tenderness over the right knee medial joint line and medial bursa I cannot detect effusion although there may be one. Her knee is stable her McMurray sign is negative for muscle tone is normal her skin shows changes of venous stasis disease is minimal peripheral edema however in a warm foot with a good dorsal pulse has normal sensation right foot  No new x-rays were obtained. Valgus arthritis in the knee on her x-ray from 2013  Impression Encounter Diagnoses  Name Primary?  . Knee bursitis, right Yes  . Arthritis of knee, degenerative     Knee  Injection Procedure Note  Pre-operative Diagnosis: right knee oa  Post-operative Diagnosis: same  Indications: pain  Anesthesia: ethyl chloride   Procedure Details   Verbal consent was obtained for the procedure. Time out was completed.The joint was prepped with alcohol, followed by  Ethyl chloride spray and A 20 gauge needle was inserted into the knee via lateral approach; 4ml 1% lidocaine and 1 ml of depomedrol  was then injected into the joint . The needle was removed and the area cleansed and dressed.  Complications:  None; patient tolerated the procedure well. Knee, bursa  Injection Procedure Note  Pre-operative Diagnosis: pes bursitis  Post-operative Diagnosis: same  Indications: pain  Anesthesia: ethyl chloride   Procedure Details   Verbal consent was obtained for the procedure. Time out was completed.The bursal area   was prepped with alcohol, followed by  Ethyl chloride spray and A 25 gauge needle was inserted into  the right  pes bursa and  4ml 1% lidocaine and 1 ml of depomedrol  was then injected into the area  . The needle was removed and the area cleansed and dressed.  Complications:  None; patient tolerated the procedure well.

## 2012-09-23 NOTE — Patient Instructions (Signed)
You have received a steroid shot. 15% of patients experience increased pain at the injection site with in the next 24 hours. This is best treated with ice and tylenol extra strength 2 tabs every 8 hours. If you are still having pain please call the office.    

## 2012-09-29 DIAGNOSIS — E119 Type 2 diabetes mellitus without complications: Secondary | ICD-10-CM | POA: Diagnosis not present

## 2012-09-29 DIAGNOSIS — IMO0001 Reserved for inherently not codable concepts without codable children: Secondary | ICD-10-CM | POA: Diagnosis not present

## 2012-09-29 DIAGNOSIS — N10 Acute tubulo-interstitial nephritis: Secondary | ICD-10-CM | POA: Diagnosis not present

## 2012-09-29 DIAGNOSIS — A498 Other bacterial infections of unspecified site: Secondary | ICD-10-CM | POA: Diagnosis not present

## 2012-09-29 DIAGNOSIS — M1710 Unilateral primary osteoarthritis, unspecified knee: Secondary | ICD-10-CM | POA: Diagnosis not present

## 2012-11-09 ENCOUNTER — Other Ambulatory Visit: Payer: Self-pay | Admitting: Family Medicine

## 2012-11-26 DIAGNOSIS — I10 Essential (primary) hypertension: Secondary | ICD-10-CM | POA: Diagnosis not present

## 2012-11-26 DIAGNOSIS — E039 Hypothyroidism, unspecified: Secondary | ICD-10-CM | POA: Diagnosis not present

## 2012-11-26 LAB — BASIC METABOLIC PANEL
Calcium: 11.2 mg/dL — ABNORMAL HIGH (ref 8.4–10.5)
Potassium: 5 mEq/L (ref 3.5–5.3)
Sodium: 139 mEq/L (ref 135–145)

## 2012-11-27 LAB — T4, FREE: Free T4: 1.64 ng/dL (ref 0.80–1.80)

## 2012-11-27 LAB — TSH: TSH: 1.752 u[IU]/mL (ref 0.350–4.500)

## 2012-11-30 ENCOUNTER — Ambulatory Visit (INDEPENDENT_AMBULATORY_CARE_PROVIDER_SITE_OTHER): Payer: Medicare Other | Admitting: Family Medicine

## 2012-11-30 ENCOUNTER — Encounter: Payer: Self-pay | Admitting: Family Medicine

## 2012-11-30 VITALS — BP 108/68 | Ht 68.0 in | Wt 273.0 lb

## 2012-11-30 DIAGNOSIS — I10 Essential (primary) hypertension: Secondary | ICD-10-CM | POA: Diagnosis not present

## 2012-11-30 DIAGNOSIS — Z23 Encounter for immunization: Secondary | ICD-10-CM

## 2012-11-30 DIAGNOSIS — Z79899 Other long term (current) drug therapy: Secondary | ICD-10-CM

## 2012-11-30 DIAGNOSIS — E039 Hypothyroidism, unspecified: Secondary | ICD-10-CM

## 2012-11-30 DIAGNOSIS — E119 Type 2 diabetes mellitus without complications: Secondary | ICD-10-CM | POA: Diagnosis not present

## 2012-11-30 DIAGNOSIS — E785 Hyperlipidemia, unspecified: Secondary | ICD-10-CM

## 2012-11-30 NOTE — Progress Notes (Signed)
  Subjective:    Patient ID: Cynthia Marks, female    DOB: 01-14-1933, 79 y.o.   MRN: [id number]  HPIHere for a check up. A1C today is 5.6. Patient states fasting blood sugar is around 100 - 100. She also had bloodwork done for thyroid last week.  She relates that she's not had any low sugar spells. She denies blurred vision excessive thirst. Denies fever chills she denies any chest tightness pressure or pain. She relates she is compliant with her medications. She does see your eye specialist on regular basis. She does not see any other doctors.  Family history benign Personal history diabetes hypertension hypothyroidism   Review of Systems She denies PND she denies swelling in the legs denies orthopnea chest pressure pain cough or wheezing    Objective:   Physical Exam  Vitals reviewed. Constitutional: She is oriented to person, place, and time. She appears well-developed and well-nourished.  HENT:  Head: Normocephalic and atraumatic.  Neck: Normal range of motion.  Cardiovascular: Normal rate, regular rhythm and normal heart sounds.   No murmur heard. Pulmonary/Chest: Effort normal and breath sounds normal. No respiratory distress. She has no wheezes.  Abdominal: Soft.  Musculoskeletal: She exhibits no edema.  Lymphadenopathy:    She has no cervical adenopathy.  Neurological: She is alert and oriented to person, place, and time.  Skin: Skin is warm and dry.          Assessment & Plan:  Stop actos, her A1c looks extremely good. The risk of Actos with bladder cancer has not worked ongoing benefit. She is taking Januvia. She is aware that that can cause pancreatitis in some individuals. She was told warning signs for that. Re check 3 months, we will recheck A1c at that time Reduce clcium- recheck that and lipid/liv/urine micro , her calcium is mildly elevated. She will reduce enough calcium she is taking we will recheck in several weeks at that time were also check a lipid  profile liver profile and urine micro-protein she'll continue her medications. She will followup sooner if any problems. 25 minutes spent with patient. S. a.l.

## 2012-12-04 DIAGNOSIS — E1149 Type 2 diabetes mellitus with other diabetic neurological complication: Secondary | ICD-10-CM | POA: Diagnosis not present

## 2012-12-04 DIAGNOSIS — B351 Tinea unguium: Secondary | ICD-10-CM | POA: Diagnosis not present

## 2012-12-24 DIAGNOSIS — E039 Hypothyroidism, unspecified: Secondary | ICD-10-CM | POA: Diagnosis not present

## 2012-12-24 DIAGNOSIS — I10 Essential (primary) hypertension: Secondary | ICD-10-CM | POA: Diagnosis not present

## 2012-12-24 DIAGNOSIS — Z79899 Other long term (current) drug therapy: Secondary | ICD-10-CM | POA: Diagnosis not present

## 2012-12-24 DIAGNOSIS — E785 Hyperlipidemia, unspecified: Secondary | ICD-10-CM | POA: Diagnosis not present

## 2012-12-24 DIAGNOSIS — E119 Type 2 diabetes mellitus without complications: Secondary | ICD-10-CM | POA: Diagnosis not present

## 2012-12-24 LAB — LIPID PANEL
Cholesterol: 122 mg/dL (ref 0–200)
HDL: 43 mg/dL (ref >39)
LDL Cholesterol: 64 mg/dL (ref 0–99)
Triglycerides: 77 mg/dL (ref <150)
VLDL: 15 mg/dL (ref 0–40)

## 2012-12-24 LAB — HEPATIC FUNCTION PANEL
ALT: 10 U/L (ref 0–35)
Albumin: 4.1 g/dL (ref 3.5–5.2)
Alkaline Phosphatase: 75 U/L (ref 39–117)
Indirect Bilirubin: 0.4 mg/dL (ref 0.0–0.9)
Total Protein: 7.4 g/dL (ref 6.0–8.3)

## 2013-01-08 ENCOUNTER — Telehealth: Payer: Self-pay | Admitting: Family Medicine

## 2013-01-08 MED ORDER — SITAGLIPTIN PHOSPHATE 100 MG PO TABS: 100.0000 mg | ORAL_TABLET | Freq: Every day | ORAL | Status: DC

## 2013-01-08 NOTE — Telephone Encounter (Signed)
Medication refill sent to pharmacy. Left message on voicemail notifying patient.

## 2013-01-08 NOTE — Telephone Encounter (Signed)
Patient needs Rx for Januvia to [facility]

## 2013-03-02 ENCOUNTER — Ambulatory Visit: Payer: PRIVATE HEALTH INSURANCE | Admitting: Family Medicine

## 2013-03-05 DIAGNOSIS — E1149 Type 2 diabetes mellitus with other diabetic neurological complication: Secondary | ICD-10-CM | POA: Diagnosis not present

## 2013-03-05 DIAGNOSIS — B351 Tinea unguium: Secondary | ICD-10-CM | POA: Diagnosis not present

## 2013-03-08 ENCOUNTER — Ambulatory Visit (INDEPENDENT_AMBULATORY_CARE_PROVIDER_SITE_OTHER): Payer: Medicare Other | Admitting: Family Medicine

## 2013-03-08 ENCOUNTER — Encounter: Payer: Self-pay | Admitting: Family Medicine

## 2013-03-08 VITALS — BP 100/70 | Ht 68.0 in | Wt 276.5 lb

## 2013-03-08 DIAGNOSIS — E039 Hypothyroidism, unspecified: Secondary | ICD-10-CM

## 2013-03-08 DIAGNOSIS — E785 Hyperlipidemia, unspecified: Secondary | ICD-10-CM

## 2013-03-08 DIAGNOSIS — E119 Type 2 diabetes mellitus without complications: Secondary | ICD-10-CM

## 2013-03-08 DIAGNOSIS — I10 Essential (primary) hypertension: Secondary | ICD-10-CM

## 2013-03-08 LAB — POCT GLYCOSYLATED HEMOGLOBIN (HGB A1C): Hemoglobin A1C: 5.6

## 2013-03-08 MED ORDER — LEVOTHYROXINE SODIUM 150 MCG PO TABS: ORAL_TABLET | ORAL | Status: DC

## 2013-03-08 MED ORDER — ROSUVASTATIN CALCIUM 20 MG PO TABS: 20.0000 mg | ORAL_TABLET | Freq: Every day | ORAL | Status: DC

## 2013-03-08 MED ORDER — CARVEDILOL 12.5 MG PO TABS: 12.5000 mg | ORAL_TABLET | Freq: Two times a day (BID) | ORAL | Status: DC

## 2013-03-08 NOTE — Progress Notes (Signed)
   Subjective:    Patient ID: Cynthia Marks, female    DOB: 1932/12/31, 79 y.o.   MRN: [id number]  Diabetes She presents for her follow-up diabetic visit. She has type 2 diabetes mellitus. Her disease course has been stable. There are no hypoglycemic associated symptoms. Pertinent negatives for hypoglycemia include no confusion. There are no diabetic associated symptoms. Pertinent negatives for diabetes include no fatigue, no polydipsia, no polyphagia and no weakness. There are no hypoglycemic complications. Symptoms are stable. There are no diabetic complications. There are no known risk factors for coronary artery disease. Current diabetic treatment includes oral agent (monotherapy). She is compliant with treatment all of the time.  Patient states she has no concerns at this time.  The patient was seen today as part of a comprehensive diabetic check up. The patient had the following elements completed: -Review of medication compliance -Review of glucose monitoring results -Review of any complications do to high or low sugars -Diabetic foot exam was completed as part of today's visit. The following was also discussed: -Importance of yearly eye exams -Importance of following diabetic/low sugar-starch diet -Importance of exercise and regular activity -Importance of regular followup visits. -Most recent hemoglobin A1c were reviewed with the patient along with goals regarding diabetes. We also talked at length about her hypertension as well as chronic ankle edema from the ankle fracture or plus also history hypothyroidism and importance of taking her medicine. She denies any particular chest tightness pressure pain shortness of breath currently. Past medical history family history review   Review of Systems  Constitutional: Negative for activity change, appetite change and fatigue.  Endocrine: Negative for polydipsia and polyphagia.  Genitourinary: Negative for frequency.  Neurological:  Negative for weakness.  Psychiatric/Behavioral: Negative for confusion.       Objective:   Physical Exam  Vitals reviewed. Constitutional: She appears well-nourished. No distress.  Cardiovascular: Normal rate, regular rhythm and normal heart sounds.   No murmur heard. Pulmonary/Chest: Effort normal and breath sounds normal. No respiratory distress.  Musculoskeletal: She exhibits no edema.  Lymphadenopathy:    She has no cervical adenopathy.  Neurological: She is alert. She exhibits normal muscle tone.  Psychiatric: Her behavior is normal.          Assessment & Plan:  Diabetes - Plan: POCT glycosylated hemoglobin (Hb A1C)  DM type 2 (diabetes mellitus, type 2)  Hypothyroidism  HTN (hypertension)  Hyperlipemia  Her A1c actually looks much better than what has been I told her that she needs to continue her current medication watch her diet closely.  She ought to continue her Synthroid. I would recommend that we recheck lab work in the spring time including lipid panel, metabolic 7, urine micro-protein, hemoglobin A1c we will see her back in 4 months. Blood pressure under good control. Previous lipid panel looked good.

## 2013-04-26 DIAGNOSIS — H35059 Retinal neovascularization, unspecified, unspecified eye: Secondary | ICD-10-CM | POA: Diagnosis not present

## 2013-04-26 DIAGNOSIS — H353290 Exudative age-related macular degeneration, unspecified eye, stage unspecified: Secondary | ICD-10-CM | POA: Diagnosis not present

## 2013-04-26 DIAGNOSIS — H3570 Unspecified separation of retinal layers: Secondary | ICD-10-CM | POA: Diagnosis not present

## 2013-04-26 DIAGNOSIS — E1139 Type 2 diabetes mellitus with other diabetic ophthalmic complication: Secondary | ICD-10-CM | POA: Diagnosis not present

## 2013-04-26 DIAGNOSIS — H353190 Nonexudative age-related macular degeneration, unspecified eye, stage unspecified: Secondary | ICD-10-CM | POA: Diagnosis not present

## 2013-06-08 DIAGNOSIS — B351 Tinea unguium: Secondary | ICD-10-CM | POA: Diagnosis not present

## 2013-07-06 ENCOUNTER — Encounter: Payer: Self-pay | Admitting: Family Medicine

## 2013-07-06 ENCOUNTER — Ambulatory Visit (INDEPENDENT_AMBULATORY_CARE_PROVIDER_SITE_OTHER): Payer: Medicare Other | Admitting: Family Medicine

## 2013-07-06 VITALS — BP 128/82 | Ht 68.0 in | Wt 272.6 lb

## 2013-07-06 DIAGNOSIS — I10 Essential (primary) hypertension: Secondary | ICD-10-CM

## 2013-07-06 DIAGNOSIS — E119 Type 2 diabetes mellitus without complications: Secondary | ICD-10-CM | POA: Diagnosis not present

## 2013-07-06 DIAGNOSIS — E039 Hypothyroidism, unspecified: Secondary | ICD-10-CM | POA: Diagnosis not present

## 2013-07-06 DIAGNOSIS — E785 Hyperlipidemia, unspecified: Secondary | ICD-10-CM | POA: Diagnosis not present

## 2013-07-06 DIAGNOSIS — M949 Disorder of cartilage, unspecified: Secondary | ICD-10-CM

## 2013-07-06 DIAGNOSIS — M899 Disorder of bone, unspecified: Secondary | ICD-10-CM

## 2013-07-06 DIAGNOSIS — M8580 Other specified disorders of bone density and structure, unspecified site: Secondary | ICD-10-CM

## 2013-07-06 LAB — POCT GLYCOSYLATED HEMOGLOBIN (HGB A1C): HEMOGLOBIN A1C: 5.5

## 2013-07-06 MED ORDER — SITAGLIPTIN PHOSPHATE 50 MG PO TABS: 50.0000 mg | ORAL_TABLET | Freq: Every day | ORAL | Status: DC

## 2013-07-06 NOTE — Progress Notes (Signed)
   Subjective:    Patient ID: Cynthia Marks, female    DOB: 1932/09/29, 80 y.o.   MRN: [id number]  Diabetes She presents for her initial diabetic visit. She has type 2 diabetes mellitus. Risk factors for coronary artery disease include diabetes mellitus and hypertension. Current diabetic treatment includes oral agent (monotherapy). She is compliant with treatment most of the time. She is following a diabetic diet.   She relates that she is trying eat somewhat healthy she's not as physically active as she like to see. She finds herself getting frustrated with lack of activity related to her diabetes heart any she denies getting depressed We did discuss her hypertension hyperlipidemia hypothyroidism and diabetes regarding the importance of taking medicine and monitoring  Review of Systems Denies headaches denies throat pain denies wheezing difficulty breathing denies chest pressure tightness pain she does relate some fatigue with activity    Objective:   Physical Exam Neck no masses eardrums normal throat is normal Lungs are clear heart is regular Extremities chronic swelling in the right ankle from a previous injury diabetic foot exam normal       Assessment & Plan:  #1 diabetes her A1c looks very good reduce Januvia from 100 mg, new dose 50 mg. Recheck hemoglobin A1c in 3-4 months Hyperlipidemia-check lab work Hypothyroidism check lab work Osteopenia check bone density Followup on test

## 2013-07-13 ENCOUNTER — Ambulatory Visit (HOSPITAL_COMMUNITY)
Admission: RE | Admit: 2013-07-13 | Discharge: 2013-07-13 | Disposition: A | Discharge status: Home / Self Care | Payer: Medicare Other | Source: Ambulatory Visit | Attending: Family Medicine | Admitting: Family Medicine

## 2013-07-13 DIAGNOSIS — M810 Age-related osteoporosis without current pathological fracture: Secondary | ICD-10-CM | POA: Diagnosis not present

## 2013-07-13 DIAGNOSIS — Z780 Asymptomatic menopausal state: Secondary | ICD-10-CM | POA: Diagnosis not present

## 2013-07-13 DIAGNOSIS — M818 Other osteoporosis without current pathological fracture: Secondary | ICD-10-CM | POA: Diagnosis not present

## 2013-07-14 ENCOUNTER — Telehealth: Payer: Self-pay | Admitting: *Deleted

## 2013-07-14 NOTE — Telephone Encounter (Signed)
Pt  has never had to take medication for osteoporosis.  She is willing to try some medication for it, according to her bone density test.

## 2013-07-15 MED ORDER — ALENDRONATE SODIUM 70 MG PO TABS: 70.0000 mg | ORAL_TABLET | ORAL | Status: DC

## 2013-07-15 NOTE — Telephone Encounter (Signed)
Medication sent to pharmacy. Patient was notified of instructions below.

## 2013-07-15 NOTE — Addendum Note (Signed)
Addended by: Jesusita Oka on: 07/15/2013 01:54 PM   Modules accepted: Orders

## 2013-07-15 NOTE — Telephone Encounter (Signed)
Fosamax 70 mg, may use generic, #4, 6 refills, if she does 90 day prescription may send and 90 day prescription. Instructed patient the way to take this is once a week in the morning when she first gets up take with a tall glass of water stay upright do not go back to bed after taking the medicine. 30 minutes later may have breakfast. If the medication causes severe reflux or indigestion stop medicine and let us know. Keep regular followups.

## 2013-09-10 DIAGNOSIS — B351 Tinea unguium: Secondary | ICD-10-CM | POA: Diagnosis not present

## 2013-09-10 DIAGNOSIS — E1149 Type 2 diabetes mellitus with other diabetic neurological complication: Secondary | ICD-10-CM | POA: Diagnosis not present

## 2013-09-13 DIAGNOSIS — I10 Essential (primary) hypertension: Secondary | ICD-10-CM | POA: Diagnosis not present

## 2013-09-13 DIAGNOSIS — E119 Type 2 diabetes mellitus without complications: Secondary | ICD-10-CM | POA: Diagnosis not present

## 2013-09-13 DIAGNOSIS — E039 Hypothyroidism, unspecified: Secondary | ICD-10-CM | POA: Diagnosis not present

## 2013-09-13 DIAGNOSIS — E785 Hyperlipidemia, unspecified: Secondary | ICD-10-CM | POA: Diagnosis not present

## 2013-09-13 LAB — BASIC METABOLIC PANEL
BUN: 21 mg/dL (ref 6–23)
CO2: 24 mEq/L (ref 19–32)
Calcium: 9.9 mg/dL (ref 8.4–10.5)
Chloride: 107 mEq/L (ref 96–112)
Creat: 1.04 mg/dL (ref 0.50–1.10)
GLUCOSE: 107 mg/dL — AB (ref 70–99)
POTASSIUM: 5 meq/L (ref 3.5–5.3)
SODIUM: 139 meq/L (ref 135–145)

## 2013-09-13 LAB — LIPID PANEL
CHOLESTEROL: 117 mg/dL (ref 0–200)
HDL: 33 mg/dL — ABNORMAL LOW (ref >39)
LDL Cholesterol: 63 mg/dL (ref 0–99)
Total CHOL/HDL Ratio: 3.5 Ratio
Triglycerides: 104 mg/dL (ref <150)
VLDL: 21 mg/dL (ref 0–40)

## 2013-09-13 LAB — TSH: TSH: 0.302 u[IU]/mL — ABNORMAL LOW (ref 0.350–4.500)

## 2013-09-15 NOTE — Addendum Note (Signed)
Addended by: Jesusita Oka on: 09/15/2013 09:21 AM   Modules accepted: Orders

## 2013-10-02 ENCOUNTER — Other Ambulatory Visit: Payer: Self-pay | Admitting: Family Medicine

## 2013-10-08 ENCOUNTER — Ambulatory Visit (INDEPENDENT_AMBULATORY_CARE_PROVIDER_SITE_OTHER): Payer: Medicare Other | Admitting: Family Medicine

## 2013-10-08 ENCOUNTER — Encounter: Payer: Self-pay | Admitting: Family Medicine

## 2013-10-08 VITALS — BP 130/82 | Ht 68.0 in | Wt 269.8 lb

## 2013-10-08 DIAGNOSIS — M810 Age-related osteoporosis without current pathological fracture: Secondary | ICD-10-CM | POA: Diagnosis not present

## 2013-10-08 DIAGNOSIS — E038 Other specified hypothyroidism: Secondary | ICD-10-CM

## 2013-10-08 DIAGNOSIS — I10 Essential (primary) hypertension: Secondary | ICD-10-CM | POA: Diagnosis not present

## 2013-10-08 DIAGNOSIS — Z23 Encounter for immunization: Secondary | ICD-10-CM | POA: Diagnosis not present

## 2013-10-08 DIAGNOSIS — E119 Type 2 diabetes mellitus without complications: Secondary | ICD-10-CM | POA: Diagnosis not present

## 2013-10-08 LAB — POCT GLYCOSYLATED HEMOGLOBIN (HGB A1C): HEMOGLOBIN A1C: 5.6

## 2013-10-08 NOTE — Progress Notes (Signed)
   Subjective:    Patient ID: Cynthia Marks, female    DOB: [birth year], 80 y.o.   MRN: [id number]  Diabetes She presents for her follow-up diabetic visit. She has type 2 diabetes mellitus. Risk factors for coronary artery disease include diabetes mellitus, obesity, post-menopausal and tobacco exposure. Current diabetic treatment includes oral agent (monotherapy). She is following a diabetic diet.    Labs were reviewed. A1c looks good previous lab work looked good.  Review of Systems Denies chest tightness pressure pain shortness breath nausea or vomiting    Objective:   Physical Exam  Blood pressure mildly elevated when I rechecked it 144/94 lungs are clear hearts regular extremities no edema diabetic foot exam completed      Assessment & Plan:  #1 diabetes good control continue current measures #2 hyperlipidemia very good control continue current measures Obesity watch diet stay physically active patient is disabled with her orthopedic problems  Blood pressure is not quite as good as I like to see I recommend we recheck her again in 4 weeks

## 2013-10-08 NOTE — Patient Instructions (Signed)
DASH Eating Plan °DASH stands for "Dietary Approaches to Stop Hypertension." The DASH eating plan is a healthy eating plan that has been shown to reduce high blood pressure (hypertension). Additional health benefits may include reducing the risk of type 2 diabetes mellitus, heart disease, and stroke. The DASH eating plan may also help with weight loss. °WHAT DO I NEED TO KNOW ABOUT THE DASH EATING PLAN? °For the DASH eating plan, you will follow these general guidelines: °· Choose foods with a percent daily value for sodium of less than 5% (as listed on the food label). °· Use salt-free seasonings or herbs instead of table salt or sea salt. °· Check with your health care provider or pharmacist before using salt substitutes. °· Eat lower-sodium products, often labeled as "lower sodium" or "no salt added." °· Eat fresh foods. °· Eat more vegetables, fruits, and low-fat dairy products. °· Choose whole grains. Look for the word "whole" as the first word in the ingredient list. °· Choose fish and skinless chicken or turkey more often than red meat. Limit fish, poultry, and meat to 6 oz (170 g) each day. °· Limit sweets, desserts, sugars, and sugary drinks. °· Choose heart-healthy fats. °· Limit cheese to 1 oz (28 g) per day. °· Eat more home-cooked food and less restaurant, buffet, and fast food. °· Limit fried foods. °· Cook foods using methods other than frying. °· Limit canned vegetables. If you do use them, rinse them well to decrease the sodium. °· When eating at a restaurant, ask that your food be prepared with less salt, or no salt if possible. °WHAT FOODS CAN I EAT? °Seek help from a dietitian for individual calorie needs. °Grains °Whole grain or whole wheat bread. Brown rice. Whole grain or whole wheat pasta. Quinoa, bulgur, and whole grain cereals. Low-sodium cereals. Corn or whole wheat flour tortillas. Whole grain cornbread. Whole grain crackers. Low-sodium crackers. °Vegetables °Fresh or frozen vegetables  (raw, steamed, roasted, or grilled). Low-sodium or reduced-sodium tomato and vegetable juices. Low-sodium or reduced-sodium tomato sauce and paste. Low-sodium or reduced-sodium canned vegetables.  °Fruits °All fresh, canned (in natural juice), or frozen fruits. °Meat and Other Protein Products °Ground beef (85% or leaner), grass-fed beef, or beef trimmed of fat. Skinless chicken or turkey. Ground chicken or turkey. Pork trimmed of fat. All fish and seafood. Eggs. Dried beans, peas, or lentils. Unsalted nuts and seeds. Unsalted canned beans. °Dairy °Low-fat dairy products, such as skim or 1% milk, 2% or reduced-fat cheeses, low-fat ricotta or cottage cheese, or plain low-fat yogurt. Low-sodium or reduced-sodium cheeses. °Fats and Oils °Tub margarines without trans fats. Light or reduced-fat mayonnaise and salad dressings (reduced sodium). Avocado. Safflower, olive, or canola oils. Natural peanut or almond butter. °Other °Unsalted popcorn and pretzels. °The items listed above may not be a complete list of recommended foods or beverages. Contact your dietitian for more options. °WHAT FOODS ARE NOT RECOMMENDED? °Grains °White bread. White pasta. White rice. Refined cornbread. Bagels and croissants. Crackers that contain trans fat. °Vegetables °Creamed or fried vegetables. Vegetables in a cheese sauce. Regular canned vegetables. Regular canned tomato sauce and paste. Regular tomato and vegetable juices. °Fruits °Dried fruits. Canned fruit in light or heavy syrup. Fruit juice. °Meat and Other Protein Products °Fatty cuts of meat. Ribs, chicken wings, bacon, sausage, bologna, salami, chitterlings, fatback, hot dogs, bratwurst, and packaged luncheon meats. Salted nuts and seeds. Canned beans with salt. °Dairy °Whole or 2% milk, cream, half-and-half, and cream cheese. Whole-fat or sweetened yogurt. Full-fat   cheeses or blue cheese. Nondairy creamers and whipped toppings. Processed cheese, cheese spreads, or cheese  curds. °Condiments °Onion and garlic salt, seasoned salt, table salt, and sea salt. Canned and packaged gravies. Worcestershire sauce. Tartar sauce. Barbecue sauce. Teriyaki sauce. Soy sauce, including reduced sodium. Steak sauce. Fish sauce. Oyster sauce. Cocktail sauce. Horseradish. Ketchup and mustard. Meat flavorings and tenderizers. Bouillon cubes. Hot sauce. Tabasco sauce. Marinades. Taco seasonings. Relishes. °Fats and Oils °Butter, stick margarine, lard, shortening, ghee, and bacon fat. Coconut, palm kernel, or palm oils. Regular salad dressings. °Other °Pickles and olives. Salted popcorn and pretzels. °The items listed above may not be a complete list of foods and beverages to avoid. Contact your dietitian for more information. °WHERE CAN I FIND MORE INFORMATION? °[employer], [employer], [employer]: www.nhlbi.nih.gov/health/health-topics/topics/dash/ °Document Released: 02/21/2011 Document Revised: 07/19/2013 Document Reviewed: 01/06/2013 °[employer]® Patient Information ©2015 [employer]. This information is not intended to replace advice given to you by your health care provider. Make sure you discuss any questions you have with your health care provider. ° °

## 2013-10-13 ENCOUNTER — Ambulatory Visit: Payer: Medicare Other | Admitting: Family Medicine

## 2013-11-03 ENCOUNTER — Ambulatory Visit (INDEPENDENT_AMBULATORY_CARE_PROVIDER_SITE_OTHER): Payer: Medicare Other | Admitting: Family Medicine

## 2013-11-03 ENCOUNTER — Encounter: Payer: Self-pay | Admitting: Family Medicine

## 2013-11-03 VITALS — BP 132/74 | Ht 68.0 in | Wt 271.0 lb

## 2013-11-03 DIAGNOSIS — I10 Essential (primary) hypertension: Secondary | ICD-10-CM | POA: Diagnosis not present

## 2013-11-03 NOTE — Progress Notes (Signed)
   Subjective:    Patient ID: Cynthia Marks, female    DOB: Jul 27, 1932, 80 y.o.   MRN: [id number]  Hypertension This is a chronic problem. The current episode started more than 1 year ago. The problem has been gradually improving since onset. The problem is controlled. There are no associated agents to hypertension. There are no known risk factors for coronary artery disease. Treatments tried: carvedilol. The current treatment provides significant improvement. There are no compliance problems.    Patient has no concerns at this time.    Review of Systems She denies dizziness denies chest pressure tightness pain and shortness of breath states her diabetes is doing good    Objective:   Physical Exam  Lungs are clear heart is regular pulse normal blood pressure sitting 132/74 standing 126/70      Assessment & Plan:  HTN-good control currently she does have some mild evidence of orthostatic hypotension but it does not go low enough to affect her. I cautioned her about getting up during the night she should sit on the side of the bed before walking  She does have a risk of falling she does try to strengthen her muscles as best as possible  A1 C., TSH with followup 3 months

## 2013-11-19 DIAGNOSIS — B351 Tinea unguium: Secondary | ICD-10-CM | POA: Diagnosis not present

## 2013-11-19 DIAGNOSIS — E1149 Type 2 diabetes mellitus with other diabetic neurological complication: Secondary | ICD-10-CM | POA: Diagnosis not present

## 2013-12-21 DIAGNOSIS — Z23 Encounter for immunization: Secondary | ICD-10-CM | POA: Diagnosis not present

## 2014-01-28 DIAGNOSIS — B351 Tinea unguium: Secondary | ICD-10-CM | POA: Diagnosis not present

## 2014-01-28 DIAGNOSIS — L851 Acquired keratosis [keratoderma] palmaris et plantaris: Secondary | ICD-10-CM | POA: Diagnosis not present

## 2014-01-28 DIAGNOSIS — E1342 Other specified diabetes mellitus with diabetic polyneuropathy: Secondary | ICD-10-CM | POA: Diagnosis not present

## 2014-03-07 DIAGNOSIS — H35351 Cystoid macular degeneration, right eye: Secondary | ICD-10-CM | POA: Diagnosis not present

## 2014-03-07 DIAGNOSIS — H3531 Nonexudative age-related macular degeneration: Secondary | ICD-10-CM | POA: Diagnosis not present

## 2014-04-12 ENCOUNTER — Other Ambulatory Visit: Payer: Self-pay | Admitting: Family Medicine

## 2014-04-12 DIAGNOSIS — E1342 Other specified diabetes mellitus with diabetic polyneuropathy: Secondary | ICD-10-CM | POA: Diagnosis not present

## 2014-04-12 DIAGNOSIS — B351 Tinea unguium: Secondary | ICD-10-CM | POA: Diagnosis not present

## 2014-04-12 DIAGNOSIS — L851 Acquired keratosis [keratoderma] palmaris et plantaris: Secondary | ICD-10-CM | POA: Diagnosis not present

## 2014-05-31 ENCOUNTER — Ambulatory Visit (INDEPENDENT_AMBULATORY_CARE_PROVIDER_SITE_OTHER): Payer: Medicare Other | Admitting: Family Medicine

## 2014-05-31 ENCOUNTER — Encounter: Payer: Self-pay | Admitting: Family Medicine

## 2014-05-31 VITALS — BP 128/84 | Ht 68.0 in | Wt 268.6 lb

## 2014-05-31 DIAGNOSIS — E785 Hyperlipidemia, unspecified: Secondary | ICD-10-CM | POA: Diagnosis not present

## 2014-05-31 DIAGNOSIS — E039 Hypothyroidism, unspecified: Secondary | ICD-10-CM | POA: Diagnosis not present

## 2014-05-31 DIAGNOSIS — E119 Type 2 diabetes mellitus without complications: Secondary | ICD-10-CM

## 2014-05-31 DIAGNOSIS — I10 Essential (primary) hypertension: Secondary | ICD-10-CM

## 2014-05-31 LAB — POCT GLYCOSYLATED HEMOGLOBIN (HGB A1C): HEMOGLOBIN A1C: 5.6

## 2014-05-31 MED ORDER — LEVOTHYROXINE SODIUM 150 MCG PO TABS: 300.0000 ug | ORAL_TABLET | Freq: Every day | ORAL | Status: DC

## 2014-05-31 MED ORDER — SITAGLIPTIN PHOSPHATE 50 MG PO TABS: 50.0000 mg | ORAL_TABLET | Freq: Every day | ORAL | Status: DC

## 2014-05-31 MED ORDER — ALENDRONATE SODIUM 70 MG PO TABS: ORAL_TABLET | ORAL | Status: DC

## 2014-05-31 MED ORDER — ROSUVASTATIN CALCIUM 20 MG PO TABS: 20.0000 mg | ORAL_TABLET | Freq: Every day | ORAL | Status: DC

## 2014-05-31 MED ORDER — CARVEDILOL 12.5 MG PO TABS: 12.5000 mg | ORAL_TABLET | Freq: Two times a day (BID) | ORAL | Status: DC

## 2014-05-31 NOTE — Progress Notes (Signed)
   Subjective:    Patient ID: Cynthia Marks, female    DOB: [birth year], 81 y.o.   MRN: [id number]  Diabetes She presents for her follow-up diabetic visit. She has type 2 diabetes mellitus. There are no hypoglycemic associated symptoms. Pertinent negatives for hypoglycemia include no confusion. Pertinent negatives for diabetes include no chest pain, no fatigue, no foot ulcerations, no polydipsia, no polyphagia and no weakness. There are no hypoglycemic complications. There are no diabetic complications. Risk factors for coronary artery disease include obesity and diabetes mellitus. Current diabetic treatment includes oral agent (monotherapy). She is compliant with treatment all of the time. She is following a diabetic diet. She has not had a previous visit with a dietitian. She rarely participates in exercise. She sees a podiatrist.Eye exam is current.  Hyperlipidemia This is a chronic problem. The current episode started more than 1 year ago. The problem is controlled. Recent lipid tests were reviewed and are normal. Pertinent negatives include no chest pain. Current antihyperlipidemic treatment includes statins. The current treatment provides significant improvement of lipids. There are no compliance problems.  Risk factors for coronary artery disease include diabetes mellitus, family history, obesity and a sedentary lifestyle.  Thyroid Problem Presents for follow-up visit. Patient reports no fatigue or hair loss. The symptoms have been stable. Past treatments include levothyroxine. The treatment provided significant relief. Her past medical history is significant for atrial fibrillation and hyperlipidemia. There are no known risk factors.  Heart Problem This is a chronic problem. The current episode started more than 1 year ago. The problem occurs rarely. The problem has been gradually improving. Pertinent negatives include no abdominal pain, chest pain, congestion, coughing, fatigue or weakness.  Nothing aggravates the symptoms.   patient takes her chronic medications keeping things under good control    Review of Systems  Constitutional: Negative for activity change, appetite change and fatigue.  HENT: Negative for congestion.   Respiratory: Negative for cough.   Cardiovascular: Negative for chest pain.  Gastrointestinal: Negative for abdominal pain.  Endocrine: Negative for polydipsia and polyphagia.  Neurological: Negative for weakness.  Psychiatric/Behavioral: Negative for confusion.       Objective:   Physical Exam  Constitutional: She appears well-nourished. No distress.  Cardiovascular: Normal rate, regular rhythm and normal heart sounds.   No murmur heard. Pulmonary/Chest: Effort normal and breath sounds normal. No respiratory distress.  Musculoskeletal: She exhibits no edema.  Lymphadenopathy:    She has no cervical adenopathy.  Neurological: She is alert. She exhibits normal muscle tone.  Psychiatric: Her behavior is normal.  Vitals reviewed.         Assessment & Plan:

## 2014-06-01 ENCOUNTER — Other Ambulatory Visit: Payer: Self-pay | Admitting: *Deleted

## 2014-06-01 MED ORDER — BLOOD GLUCOSE MONITOR KIT: PACK | Status: DC

## 2014-06-01 MED ORDER — SITAGLIPTIN PHOSPHATE 50 MG PO TABS: 50.0000 mg | ORAL_TABLET | Freq: Every day | ORAL | Status: DC

## 2014-06-21 DIAGNOSIS — L851 Acquired keratosis [keratoderma] palmaris et plantaris: Secondary | ICD-10-CM | POA: Diagnosis not present

## 2014-06-21 DIAGNOSIS — B351 Tinea unguium: Secondary | ICD-10-CM | POA: Diagnosis not present

## 2014-06-21 DIAGNOSIS — E1342 Other specified diabetes mellitus with diabetic polyneuropathy: Secondary | ICD-10-CM | POA: Diagnosis not present

## 2014-08-30 DIAGNOSIS — B351 Tinea unguium: Secondary | ICD-10-CM | POA: Diagnosis not present

## 2014-08-30 DIAGNOSIS — E1342 Other specified diabetes mellitus with diabetic polyneuropathy: Secondary | ICD-10-CM | POA: Diagnosis not present

## 2014-09-05 DIAGNOSIS — H43811 Vitreous degeneration, right eye: Secondary | ICD-10-CM | POA: Diagnosis not present

## 2014-09-05 DIAGNOSIS — H3531 Nonexudative age-related macular degeneration: Secondary | ICD-10-CM | POA: Diagnosis not present

## 2014-09-05 LAB — HM DIABETES EYE EXAM

## 2014-09-07 DIAGNOSIS — I10 Essential (primary) hypertension: Secondary | ICD-10-CM | POA: Diagnosis not present

## 2014-09-07 DIAGNOSIS — E785 Hyperlipidemia, unspecified: Secondary | ICD-10-CM | POA: Diagnosis not present

## 2014-09-07 DIAGNOSIS — E039 Hypothyroidism, unspecified: Secondary | ICD-10-CM | POA: Diagnosis not present

## 2014-09-07 DIAGNOSIS — E119 Type 2 diabetes mellitus without complications: Secondary | ICD-10-CM | POA: Diagnosis not present

## 2014-09-08 LAB — LIPID PANEL
Chol/HDL Ratio: 3.4 ratio units (ref 0.0–4.4)
Cholesterol, Total: 125 mg/dL (ref 100–199)
HDL: 37 mg/dL — AB (ref >39)
LDL Calculated: 66 mg/dL (ref 0–99)
Triglycerides: 111 mg/dL (ref 0–149)
VLDL Cholesterol Cal: 22 mg/dL (ref 5–40)

## 2014-09-08 LAB — BASIC METABOLIC PANEL
BUN/Creatinine Ratio: 19 (ref 11–26)
BUN: 19 mg/dL (ref 8–27)
CO2: 22 mmol/L (ref 18–29)
CREATININE: 1.02 mg/dL — AB (ref 0.57–1.00)
Calcium: 10.5 mg/dL — ABNORMAL HIGH (ref 8.7–10.3)
Chloride: 102 mmol/L (ref 97–108)
GFR calc Af Amer: 60 mL/min/{1.73_m2} (ref >59)
GFR, EST NON AFRICAN AMERICAN: 52 mL/min/{1.73_m2} — AB (ref >59)
Glucose: 112 mg/dL — ABNORMAL HIGH (ref 65–99)
Potassium: 5.1 mmol/L (ref 3.5–5.2)
SODIUM: 141 mmol/L (ref 134–144)

## 2014-09-08 LAB — MICROALBUMIN, URINE: MICROALBUM., U, RANDOM: 24.6 ug/mL

## 2014-09-08 LAB — HEPATIC FUNCTION PANEL
ALT: 10 IU/L (ref 0–32)
AST: 17 IU/L (ref 0–40)
Albumin: 4.2 g/dL (ref 3.5–4.7)
Alkaline Phosphatase: 53 IU/L (ref 39–117)
Bilirubin Total: 0.5 mg/dL (ref 0.0–1.2)
Bilirubin, Direct: 0.1 mg/dL (ref 0.00–0.40)
TOTAL PROTEIN: 7.1 g/dL (ref 6.0–8.5)

## 2014-09-08 LAB — TSH: TSH: 36.51 u[IU]/mL — ABNORMAL HIGH (ref 0.450–4.500)

## 2014-09-22 NOTE — Progress Notes (Signed)
[facility] 09/22/14

## 2014-09-22 NOTE — Addendum Note (Signed)
Addended by: Launa Grill on: 09/22/2014 03:22 PM   Modules accepted: Orders

## 2014-10-12 ENCOUNTER — Other Ambulatory Visit: Payer: Self-pay | Admitting: *Deleted

## 2014-10-12 MED ORDER — ROSUVASTATIN CALCIUM 20 MG PO TABS: 20.0000 mg | ORAL_TABLET | Freq: Every day | ORAL | Status: DC

## 2014-10-12 MED ORDER — ROSUVASTATIN CALCIUM 20 MG PO TABS: 90.0000 mg | ORAL_TABLET | Freq: Every day | ORAL | Status: DC

## 2014-11-11 DIAGNOSIS — B351 Tinea unguium: Secondary | ICD-10-CM | POA: Diagnosis not present

## 2014-11-11 DIAGNOSIS — E1342 Other specified diabetes mellitus with diabetic polyneuropathy: Secondary | ICD-10-CM | POA: Diagnosis not present

## 2014-11-24 DIAGNOSIS — E039 Hypothyroidism, unspecified: Secondary | ICD-10-CM | POA: Diagnosis not present

## 2014-11-25 LAB — TSH: TSH: 0.823 u[IU]/mL (ref 0.450–4.500)

## 2014-11-30 ENCOUNTER — Ambulatory Visit (INDEPENDENT_AMBULATORY_CARE_PROVIDER_SITE_OTHER): Payer: Medicare Other | Admitting: Family Medicine

## 2014-11-30 ENCOUNTER — Encounter: Payer: Self-pay | Admitting: Family Medicine

## 2014-11-30 VITALS — BP 124/84 | Ht 68.0 in | Wt 262.0 lb

## 2014-11-30 DIAGNOSIS — E038 Other specified hypothyroidism: Secondary | ICD-10-CM

## 2014-11-30 DIAGNOSIS — Z23 Encounter for immunization: Secondary | ICD-10-CM | POA: Diagnosis not present

## 2014-11-30 DIAGNOSIS — I10 Essential (primary) hypertension: Secondary | ICD-10-CM

## 2014-11-30 DIAGNOSIS — E785 Hyperlipidemia, unspecified: Secondary | ICD-10-CM

## 2014-11-30 DIAGNOSIS — M810 Age-related osteoporosis without current pathological fracture: Secondary | ICD-10-CM

## 2014-11-30 DIAGNOSIS — H8390 Unspecified disease of inner ear, unspecified ear: Secondary | ICD-10-CM | POA: Diagnosis not present

## 2014-11-30 DIAGNOSIS — E119 Type 2 diabetes mellitus without complications: Secondary | ICD-10-CM | POA: Diagnosis not present

## 2014-11-30 MED ORDER — CARVEDILOL 12.5 MG PO TABS: 12.5000 mg | ORAL_TABLET | Freq: Two times a day (BID) | ORAL | Status: DC

## 2014-11-30 MED ORDER — ALENDRONATE SODIUM 70 MG PO TABS: ORAL_TABLET | ORAL | Status: DC

## 2014-11-30 MED ORDER — LEVOTHYROXINE SODIUM 150 MCG PO TABS: ORAL_TABLET | ORAL | Status: DC

## 2014-11-30 MED ORDER — SITAGLIPTIN PHOSPHATE 50 MG PO TABS: 50.0000 mg | ORAL_TABLET | Freq: Every day | ORAL | Status: DC

## 2014-11-30 MED ORDER — MECLIZINE HCL 25 MG PO TABS: 25.0000 mg | ORAL_TABLET | Freq: Three times a day (TID) | ORAL | Status: DC | PRN

## 2014-11-30 MED ORDER — ROSUVASTATIN CALCIUM 20 MG PO TABS: 20.0000 mg | ORAL_TABLET | Freq: Every day | ORAL | Status: DC

## 2014-11-30 NOTE — Progress Notes (Addendum)
   Subjective:    Patient ID: Cynthia Marks, female    DOB: 01-09-1933, 81 y.o.   MRN: [id number]  Diabetes She presents for her follow-up diabetic visit. She has type 2 diabetes mellitus. Pertinent negatives for hypoglycemia include no confusion. Pertinent negatives for diabetes include no chest pain, no fatigue, no polydipsia, no polyphagia and no weakness. Current diabetic treatment includes oral agent (monotherapy). She is compliant with treatment all of the time. She is following a diabetic diet. She sees a podiatrist.Eye exam is current (june 2016).  no low spells Dizziness in the am. Pt states she used to take antivert for this problem.   she denies any numbness or unilateral weakness She takes her thyroid medicine as directed she states she feels her energy level is fairly decent She watches her diet as best she can takes her medicine tolerates it well  she takes her osteoporosis medicine as well as vitamin D and calcium tries to be wearing supporting in her activities she has not had any falls she denies being depressed  Review of Systems  Constitutional: Negative for activity change, appetite change and fatigue.  HENT: Negative for congestion.   Respiratory: Negative for cough.   Cardiovascular: Negative for chest pain.  Gastrointestinal: Negative for abdominal pain.  Endocrine: Negative for polydipsia and polyphagia.  Neurological: Negative for weakness.  Psychiatric/Behavioral: Negative for confusion.       Objective:   Physical Exam  Constitutional: She appears well-nourished. No distress.  Cardiovascular: Normal rate, regular rhythm and normal heart sounds.   No murmur heard. Pulmonary/Chest: Effort normal and breath sounds normal. No respiratory distress.  Musculoskeletal: She exhibits no edema.  Lymphadenopathy:    She has no cervical adenopathy.  Neurological: She is alert. She exhibits normal muscle tone.  Psychiatric: Her behavior is normal.  Vitals  reviewed.    diabetic foot exam completed today      Assessment & Plan:  1. Type 2 diabetes mellitus without complication  N1Z actually very good control continue current measures watch diet closely - POCT glycosylated hemoglobin (Hb A1C)  2. Encounter for immunization  immunizations today  3. Essential hypertension  blood pressure good control continue current measures blood pressure checked sitting and standing no appreciable dry  4. Other specified hypothyroidism  recent TSH looked good repeat this again by springtime continue medication.  5. Hyperlipemia  cholesterol control good continue current medication  6. Inner ear disease, unspecified laterality  meclizine when necessary if ongoing troubles referral to ENT  7. Osteoporosis  bone density next year continue medication calcium vitamin D  Diabetic foot exam was completed please see details does show mild neuropathy as well as pre-ulcerative calluses and early bunion development she would benefit from diabetic shoes

## 2014-12-08 ENCOUNTER — Telehealth: Payer: Self-pay | Admitting: Family Medicine

## 2014-12-08 MED ORDER — SULFAMETHOXAZOLE-TRIMETHOPRIM 800-160 MG PO TABS: 1.0000 | ORAL_TABLET | Freq: Two times a day (BID) | ORAL | Status: DC

## 2014-12-08 NOTE — Telephone Encounter (Signed)
Patient reports dizzy spells for last few weeks- on meclizine for one week- still dizzy esp in am when she first gets up.

## 2014-12-08 NOTE — Telephone Encounter (Signed)
Per Dr Nicki Reaper: Bactrim DS one BID for 7 days. Med sent electronically to pharmacy. Patient notified.

## 2014-12-08 NOTE — Telephone Encounter (Signed)
Pt seen recently, meds not helping, still having dizziness, possible inner ear - would like antibiotics [facility]  Please advise

## 2015-02-04 ENCOUNTER — Other Ambulatory Visit: Payer: Self-pay | Admitting: Family Medicine

## 2015-03-03 DIAGNOSIS — B351 Tinea unguium: Secondary | ICD-10-CM | POA: Diagnosis not present

## 2015-03-03 DIAGNOSIS — E1342 Other specified diabetes mellitus with diabetic polyneuropathy: Secondary | ICD-10-CM | POA: Diagnosis not present

## 2015-03-06 DIAGNOSIS — H43811 Vitreous degeneration, right eye: Secondary | ICD-10-CM | POA: Diagnosis not present

## 2015-03-06 DIAGNOSIS — H43812 Vitreous degeneration, left eye: Secondary | ICD-10-CM | POA: Diagnosis not present

## 2015-03-06 DIAGNOSIS — H353132 Nonexudative age-related macular degeneration, bilateral, intermediate dry stage: Secondary | ICD-10-CM | POA: Diagnosis not present

## 2015-03-06 DIAGNOSIS — H31009 Unspecified chorioretinal scars, unspecified eye: Secondary | ICD-10-CM | POA: Diagnosis not present

## 2015-03-06 DIAGNOSIS — E119 Type 2 diabetes mellitus without complications: Secondary | ICD-10-CM | POA: Diagnosis not present

## 2015-03-06 DIAGNOSIS — H35363 Drusen (degenerative) of macula, bilateral: Secondary | ICD-10-CM | POA: Diagnosis not present

## 2015-03-06 LAB — HM DIABETES EYE EXAM

## 2015-05-19 DIAGNOSIS — B351 Tinea unguium: Secondary | ICD-10-CM | POA: Diagnosis not present

## 2015-05-19 DIAGNOSIS — E1342 Other specified diabetes mellitus with diabetic polyneuropathy: Secondary | ICD-10-CM | POA: Diagnosis not present

## 2015-05-29 ENCOUNTER — Encounter: Payer: Self-pay | Admitting: *Deleted

## 2015-05-30 ENCOUNTER — Ambulatory Visit: Payer: Medicare Other | Admitting: Family Medicine

## 2015-06-05 ENCOUNTER — Ambulatory Visit (INDEPENDENT_AMBULATORY_CARE_PROVIDER_SITE_OTHER): Payer: Medicare Other | Admitting: Family Medicine

## 2015-06-05 ENCOUNTER — Encounter: Payer: Self-pay | Admitting: Family Medicine

## 2015-06-05 VITALS — BP 110/72 | Ht 68.0 in | Wt 257.0 lb

## 2015-06-05 DIAGNOSIS — S82891A Other fracture of right lower leg, initial encounter for closed fracture: Secondary | ICD-10-CM | POA: Diagnosis not present

## 2015-06-05 DIAGNOSIS — D649 Anemia, unspecified: Secondary | ICD-10-CM | POA: Diagnosis not present

## 2015-06-05 DIAGNOSIS — E038 Other specified hypothyroidism: Secondary | ICD-10-CM | POA: Diagnosis not present

## 2015-06-05 DIAGNOSIS — I10 Essential (primary) hypertension: Secondary | ICD-10-CM

## 2015-06-05 DIAGNOSIS — E119 Type 2 diabetes mellitus without complications: Secondary | ICD-10-CM | POA: Diagnosis not present

## 2015-06-05 DIAGNOSIS — S82891S Other fracture of right lower leg, sequela: Secondary | ICD-10-CM

## 2015-06-05 LAB — POCT GLYCOSYLATED HEMOGLOBIN (HGB A1C): Hemoglobin A1C: 6.2

## 2015-06-05 MED ORDER — TRAMADOL HCL 50 MG PO TABS: 50.0000 mg | ORAL_TABLET | Freq: Three times a day (TID) | ORAL | Status: DC | PRN

## 2015-06-05 NOTE — Progress Notes (Addendum)
   Subjective:    Patient ID: Cynthia Marks, female    DOB: 1933/02/10, 82 y.o.   MRN: [id number]  Diabetes She presents for her follow-up diabetic visit. She has type 2 diabetes mellitus. Pertinent negatives for hypoglycemia include no confusion. Pertinent negatives for diabetes include no chest pain, no fatigue, no polydipsia, no polyphagia and no weakness. Risk factors for coronary artery disease include diabetes mellitus, hypertension, obesity, post-menopausal and sedentary lifestyle. Current diabetic treatment includes oral agent (monotherapy). She is compliant with treatment all of the time. Her weight is stable. She is following a diabetic diet.   Patient does take her Fosamax she does try to stay physically active but is unable to do much walking at all because weakness in her legs she finds herself feeling more unsteady she denies unilateral numbness or weakness PMH benign Hypothyroidism she takes her thyroid on most days but some day she forgets Hyperlipidemia she takes her medication as directed denies any problems with it tries to watch her diet but she is limited on food choices because of inability to cook Anemia from previous CBC will need to recheck  Review of Systems  Constitutional: Negative for activity change, appetite change and fatigue.  HENT: Negative for congestion.   Respiratory: Negative for cough.   Cardiovascular: Negative for chest pain.  Gastrointestinal: Negative for abdominal pain.  Endocrine: Negative for polydipsia and polyphagia.  Neurological: Negative for weakness.  Psychiatric/Behavioral: Negative for confusion.       Objective:   Physical Exam  Constitutional: She appears well-nourished. No distress.  Cardiovascular: Normal rate, regular rhythm and normal heart sounds.   No murmur heard. Pulmonary/Chest: Effort normal and breath sounds normal. No respiratory distress.  Musculoskeletal: She exhibits no edema.  Lymphadenopathy:    She has no  cervical adenopathy.  Neurological: She is alert. She exhibits normal muscle tone.  Psychiatric: Her behavior is normal.  Vitals reviewed.   Diabetic foot exam completed patient with mild neuropathy as well as some deformities and pre-ulcerative calluses      Assessment & Plan:  HTN decent control continue medication as is Diabetes A1c looks good continue current medication Hyperlipidemia continue current medication check lipid liver profile Cardiac condition under good control Osteoporosis using Fosamax once a week Patient becoming more frail weakness in the legs I recommend regular walking and movement to help herself   right ankle fracture was accidentally included in this visit this has been deleted

## 2015-06-26 ENCOUNTER — Ambulatory Visit: Payer: Medicare Other | Admitting: Family Medicine

## 2015-06-26 DIAGNOSIS — E119 Type 2 diabetes mellitus without complications: Secondary | ICD-10-CM | POA: Diagnosis not present

## 2015-06-26 DIAGNOSIS — I10 Essential (primary) hypertension: Secondary | ICD-10-CM | POA: Diagnosis not present

## 2015-06-26 DIAGNOSIS — D649 Anemia, unspecified: Secondary | ICD-10-CM | POA: Diagnosis not present

## 2015-06-26 DIAGNOSIS — E038 Other specified hypothyroidism: Secondary | ICD-10-CM | POA: Diagnosis not present

## 2015-06-27 LAB — CBC WITH DIFFERENTIAL/PLATELET
BASOS ABS: 0.1 10*3/uL (ref 0.0–0.2)
Basos: 0 %
EOS (ABSOLUTE): 0.2 10*3/uL (ref 0.0–0.4)
Eos: 2 %
HEMOGLOBIN: 11.4 g/dL (ref 11.1–15.9)
Hematocrit: 36.1 % (ref 34.0–46.6)
Immature Grans (Abs): 0 10*3/uL (ref 0.0–0.1)
Immature Granulocytes: 0 %
LYMPHS ABS: 2 10*3/uL (ref 0.7–3.1)
Lymphs: 17 %
MCH: 29 pg (ref 26.6–33.0)
MCHC: 31.6 g/dL (ref 31.5–35.7)
MCV: 92 fL (ref 79–97)
MONOCYTES: 6 %
MONOS ABS: 0.8 10*3/uL (ref 0.1–0.9)
NEUTROS ABS: 9 10*3/uL — AB (ref 1.4–7.0)
Neutrophils: 75 %
Platelets: 362 10*3/uL (ref 150–379)
RBC: 3.93 x10E6/uL (ref 3.77–5.28)
RDW: 13.9 % (ref 12.3–15.4)
WBC: 12.1 10*3/uL — AB (ref 3.4–10.8)

## 2015-06-27 LAB — LIPID PANEL
CHOL/HDL RATIO: 3.8 ratio (ref 0.0–4.4)
CHOLESTEROL TOTAL: 120 mg/dL (ref 100–199)
HDL: 32 mg/dL — AB (ref >39)
LDL Calculated: 63 mg/dL (ref 0–99)
TRIGLYCERIDES: 126 mg/dL (ref 0–149)
VLDL Cholesterol Cal: 25 mg/dL (ref 5–40)

## 2015-06-27 LAB — TSH: TSH: 57.14 u[IU]/mL — AB (ref 0.450–4.500)

## 2015-06-27 LAB — BASIC METABOLIC PANEL
BUN/Creatinine Ratio: 19 (ref 12–28)
BUN: 22 mg/dL (ref 8–27)
CALCIUM: 10.8 mg/dL — AB (ref 8.7–10.3)
CHLORIDE: 100 mmol/L (ref 96–106)
CO2: 21 mmol/L (ref 18–29)
Creatinine, Ser: 1.16 mg/dL — ABNORMAL HIGH (ref 0.57–1.00)
GFR calc Af Amer: 51 mL/min/{1.73_m2} — ABNORMAL LOW (ref >59)
GFR calc non Af Amer: 45 mL/min/{1.73_m2} — ABNORMAL LOW (ref >59)
GLUCOSE: 107 mg/dL — AB (ref 65–99)
POTASSIUM: 5.3 mmol/L — AB (ref 3.5–5.2)
SODIUM: 139 mmol/L (ref 134–144)

## 2015-06-27 LAB — HEPATIC FUNCTION PANEL
ALK PHOS: 60 IU/L (ref 39–117)
ALT: 8 IU/L (ref 0–32)
AST: 15 IU/L (ref 0–40)
Albumin: 3.9 g/dL (ref 3.5–4.7)
BILIRUBIN TOTAL: 0.5 mg/dL (ref 0.0–1.2)
BILIRUBIN, DIRECT: 0.13 mg/dL (ref 0.00–0.40)
Total Protein: 8.2 g/dL (ref 6.0–8.5)

## 2015-06-30 ENCOUNTER — Telehealth: Payer: Self-pay | Admitting: Family Medicine

## 2015-06-30 NOTE — Addendum Note (Signed)
Addended by: Dairl Ponder on: 06/30/2015 09:48 AM   Modules accepted: Orders

## 2015-06-30 NOTE — Telephone Encounter (Signed)
noted 

## 2015-06-30 NOTE — Telephone Encounter (Signed)
Please see documentation on her thyroid test under result notes thank you

## 2015-07-01 ENCOUNTER — Other Ambulatory Visit: Payer: Self-pay | Admitting: Family Medicine

## 2015-07-12 ENCOUNTER — Telehealth: Payer: Self-pay | Admitting: Family Medicine

## 2015-07-12 NOTE — Telephone Encounter (Signed)
[facility] form for Face to Face in your yellow folder

## 2015-07-13 NOTE — Telephone Encounter (Signed)
Please send previous message to [facility]

## 2015-07-13 NOTE — Telephone Encounter (Signed)
Cynthia Marks, the patient is homebound is best I can tell. I believe she was diagnosed with a lower leg injury by Dr. Aline Brochure in her husband has been taking care of her at home I saw this patient back in March which was before the request for home health I am not sure what [facility] once to do please look into this and advise me what would be the next step for this patient.

## 2015-07-14 NOTE — Telephone Encounter (Signed)
[facility], question if pt is able to come in for OV to do F2F fof home health order If not able to come in need to see what [facility] recommends per Dr. Nicki Reaper

## 2015-07-20 NOTE — Telephone Encounter (Signed)
appt made for patient to come in for F2F

## 2015-07-24 ENCOUNTER — Ambulatory Visit (INDEPENDENT_AMBULATORY_CARE_PROVIDER_SITE_OTHER): Payer: Medicare Other | Admitting: Family Medicine

## 2015-07-24 ENCOUNTER — Encounter: Payer: Self-pay | Admitting: Family Medicine

## 2015-07-24 VITALS — BP 128/80 | Temp 97.6°F | Ht 68.0 in | Wt 242.5 lb

## 2015-07-24 DIAGNOSIS — M25571 Pain in right ankle and joints of right foot: Secondary | ICD-10-CM

## 2015-07-24 DIAGNOSIS — R29898 Other symptoms and signs involving the musculoskeletal system: Secondary | ICD-10-CM

## 2015-07-24 MED ORDER — CARVEDILOL 12.5 MG PO TABS: 12.5000 mg | ORAL_TABLET | Freq: Two times a day (BID) | ORAL | Status: DC

## 2015-07-24 MED ORDER — ROSUVASTATIN CALCIUM 20 MG PO TABS: 20.0000 mg | ORAL_TABLET | Freq: Every day | ORAL | Status: DC

## 2015-07-24 MED ORDER — SITAGLIPTIN PHOSPHATE 50 MG PO TABS: 50.0000 mg | ORAL_TABLET | Freq: Every day | ORAL | Status: DC

## 2015-07-24 MED ORDER — LEVOTHYROXINE SODIUM 150 MCG PO TABS: ORAL_TABLET | ORAL | Status: DC

## 2015-07-24 MED ORDER — TRAMADOL HCL 50 MG PO TABS: 50.0000 mg | ORAL_TABLET | Freq: Three times a day (TID) | ORAL | Status: DC | PRN

## 2015-07-24 NOTE — Progress Notes (Signed)
   Subjective:    Patient ID: Cynthia Marks, female    DOB: 16-Jun-1932, 82 y.o.   MRN: [id number]  Ankle Pain  The incident occurred more than 1 week ago. The injury mechanism was a fall. The pain is present in the right ankle and right knee.   Patient in today for pain to right knee and right ankle. Had a fall 3 years ago and fractured ankle and injured right knee. Also has concerns of weakness, and fatigue.  Patient has significant tiredness fatigue just does not feel good relates she can't get around well because of her legs giving out on her knees and ankles as well. She also relates some swelling in the lower ankles as well denies wheezing or difficulty breathing.  Face to face evealuation for the purpose of home health was done today Review of Systems  Constitutional: Positive for fatigue. Negative for fever.  HENT: Negative for congestion.   Respiratory: Negative for cough, choking and shortness of breath.   Cardiovascular: Negative for chest pain.  Musculoskeletal: Positive for joint swelling.       Objective:   Physical Exam  Constitutional: She appears well-nourished. No distress.  HENT:  Head: Normocephalic.  Cardiovascular: Normal rate, regular rhythm and normal heart sounds.   No murmur heard. Pulmonary/Chest: Effort normal and breath sounds normal. She has no wheezes. She has no rales.  Abdominal: She exhibits no distension.  Musculoskeletal: She exhibits no edema.  Ankle pain , knee arthritis,leg weakness  Lymphadenopathy:    She has no cervical adenopathy.  Neurological: She is alert.  Psychiatric: Her behavior is normal.  Vitals reviewed.         Assessment & Plan:  Patient was seen as part of a face-to-face evaluation Patient with significant right ankle pain bilateral knee pain and leg weakness Has significant ataxia This patient benefits from using her walker. She is having a difficult time caring for herself or her husband is doing the best he  can but he is limited by his heart problems We will go ahead and consult for home health hopefully they can improve ID aid to assist with bathing plus also physical therapy to strengthen her legs This patient is homebound because of her health issues. Tramadol may be used 3 times daily to help with pain as long as it does not cause drowsiness anti-inflammatories not a good idea because of her significant age and health issues could be a set up for a bleeding ulcer

## 2015-08-01 ENCOUNTER — Telehealth: Payer: Self-pay | Admitting: Family Medicine

## 2015-08-01 DIAGNOSIS — Z6839 Body mass index (BMI) 39.0-39.9, adult: Secondary | ICD-10-CM | POA: Diagnosis not present

## 2015-08-01 DIAGNOSIS — M25571 Pain in right ankle and joints of right foot: Secondary | ICD-10-CM | POA: Diagnosis not present

## 2015-08-01 DIAGNOSIS — E119 Type 2 diabetes mellitus without complications: Secondary | ICD-10-CM | POA: Diagnosis not present

## 2015-08-01 DIAGNOSIS — I10 Essential (primary) hypertension: Secondary | ICD-10-CM | POA: Diagnosis not present

## 2015-08-01 DIAGNOSIS — M25561 Pain in right knee: Secondary | ICD-10-CM | POA: Diagnosis not present

## 2015-08-01 DIAGNOSIS — R531 Weakness: Secondary | ICD-10-CM | POA: Diagnosis not present

## 2015-08-01 DIAGNOSIS — E785 Hyperlipidemia, unspecified: Secondary | ICD-10-CM | POA: Diagnosis not present

## 2015-08-01 DIAGNOSIS — E669 Obesity, unspecified: Secondary | ICD-10-CM | POA: Diagnosis not present

## 2015-08-01 DIAGNOSIS — M25562 Pain in left knee: Secondary | ICD-10-CM | POA: Diagnosis not present

## 2015-08-01 DIAGNOSIS — M810 Age-related osteoporosis without current pathological fracture: Secondary | ICD-10-CM | POA: Diagnosis not present

## 2015-08-01 DIAGNOSIS — R270 Ataxia, unspecified: Secondary | ICD-10-CM | POA: Diagnosis not present

## 2015-08-01 NOTE — Telephone Encounter (Signed)
Left message with verbal order. 

## 2015-08-01 NOTE — Telephone Encounter (Signed)
May have OT consult and treatment

## 2015-08-01 NOTE — Telephone Encounter (Signed)
The nurse from [facility] called requesting occupational therapy eval for the pt. The pt is having trouble getting in and out of bath tub and with her arms.

## 2015-08-03 DIAGNOSIS — M25571 Pain in right ankle and joints of right foot: Secondary | ICD-10-CM | POA: Diagnosis not present

## 2015-08-03 DIAGNOSIS — M25561 Pain in right knee: Secondary | ICD-10-CM | POA: Diagnosis not present

## 2015-08-03 DIAGNOSIS — M810 Age-related osteoporosis without current pathological fracture: Secondary | ICD-10-CM | POA: Diagnosis not present

## 2015-08-03 DIAGNOSIS — R531 Weakness: Secondary | ICD-10-CM | POA: Diagnosis not present

## 2015-08-03 DIAGNOSIS — M25562 Pain in left knee: Secondary | ICD-10-CM | POA: Diagnosis not present

## 2015-08-03 DIAGNOSIS — R270 Ataxia, unspecified: Secondary | ICD-10-CM | POA: Diagnosis not present

## 2015-08-07 DIAGNOSIS — M810 Age-related osteoporosis without current pathological fracture: Secondary | ICD-10-CM | POA: Diagnosis not present

## 2015-08-07 DIAGNOSIS — M25561 Pain in right knee: Secondary | ICD-10-CM | POA: Diagnosis not present

## 2015-08-07 DIAGNOSIS — R531 Weakness: Secondary | ICD-10-CM | POA: Diagnosis not present

## 2015-08-07 DIAGNOSIS — M25562 Pain in left knee: Secondary | ICD-10-CM | POA: Diagnosis not present

## 2015-08-07 DIAGNOSIS — M25571 Pain in right ankle and joints of right foot: Secondary | ICD-10-CM | POA: Diagnosis not present

## 2015-08-07 DIAGNOSIS — R270 Ataxia, unspecified: Secondary | ICD-10-CM | POA: Diagnosis not present

## 2015-08-08 DIAGNOSIS — M25561 Pain in right knee: Secondary | ICD-10-CM | POA: Diagnosis not present

## 2015-08-08 DIAGNOSIS — M810 Age-related osteoporosis without current pathological fracture: Secondary | ICD-10-CM | POA: Diagnosis not present

## 2015-08-08 DIAGNOSIS — M25562 Pain in left knee: Secondary | ICD-10-CM | POA: Diagnosis not present

## 2015-08-08 DIAGNOSIS — R531 Weakness: Secondary | ICD-10-CM | POA: Diagnosis not present

## 2015-08-08 DIAGNOSIS — R270 Ataxia, unspecified: Secondary | ICD-10-CM | POA: Diagnosis not present

## 2015-08-08 DIAGNOSIS — M25571 Pain in right ankle and joints of right foot: Secondary | ICD-10-CM | POA: Diagnosis not present

## 2015-08-10 DIAGNOSIS — M25561 Pain in right knee: Secondary | ICD-10-CM | POA: Diagnosis not present

## 2015-08-10 DIAGNOSIS — R270 Ataxia, unspecified: Secondary | ICD-10-CM | POA: Diagnosis not present

## 2015-08-10 DIAGNOSIS — M25571 Pain in right ankle and joints of right foot: Secondary | ICD-10-CM | POA: Diagnosis not present

## 2015-08-10 DIAGNOSIS — M810 Age-related osteoporosis without current pathological fracture: Secondary | ICD-10-CM | POA: Diagnosis not present

## 2015-08-10 DIAGNOSIS — M25562 Pain in left knee: Secondary | ICD-10-CM | POA: Diagnosis not present

## 2015-08-10 DIAGNOSIS — R531 Weakness: Secondary | ICD-10-CM | POA: Diagnosis not present

## 2015-08-11 DIAGNOSIS — M810 Age-related osteoporosis without current pathological fracture: Secondary | ICD-10-CM | POA: Diagnosis not present

## 2015-08-11 DIAGNOSIS — M25561 Pain in right knee: Secondary | ICD-10-CM | POA: Diagnosis not present

## 2015-08-11 DIAGNOSIS — R531 Weakness: Secondary | ICD-10-CM | POA: Diagnosis not present

## 2015-08-11 DIAGNOSIS — M25562 Pain in left knee: Secondary | ICD-10-CM | POA: Diagnosis not present

## 2015-08-11 DIAGNOSIS — R270 Ataxia, unspecified: Secondary | ICD-10-CM | POA: Diagnosis not present

## 2015-08-11 DIAGNOSIS — M25571 Pain in right ankle and joints of right foot: Secondary | ICD-10-CM | POA: Diagnosis not present

## 2015-08-14 DIAGNOSIS — M25562 Pain in left knee: Secondary | ICD-10-CM | POA: Diagnosis not present

## 2015-08-14 DIAGNOSIS — R270 Ataxia, unspecified: Secondary | ICD-10-CM | POA: Diagnosis not present

## 2015-08-14 DIAGNOSIS — R531 Weakness: Secondary | ICD-10-CM | POA: Diagnosis not present

## 2015-08-14 DIAGNOSIS — M810 Age-related osteoporosis without current pathological fracture: Secondary | ICD-10-CM | POA: Diagnosis not present

## 2015-08-14 DIAGNOSIS — M25571 Pain in right ankle and joints of right foot: Secondary | ICD-10-CM | POA: Diagnosis not present

## 2015-08-14 DIAGNOSIS — M25561 Pain in right knee: Secondary | ICD-10-CM | POA: Diagnosis not present

## 2015-08-15 DIAGNOSIS — M25562 Pain in left knee: Secondary | ICD-10-CM | POA: Diagnosis not present

## 2015-08-15 DIAGNOSIS — R270 Ataxia, unspecified: Secondary | ICD-10-CM | POA: Diagnosis not present

## 2015-08-15 DIAGNOSIS — M810 Age-related osteoporosis without current pathological fracture: Secondary | ICD-10-CM | POA: Diagnosis not present

## 2015-08-15 DIAGNOSIS — M25571 Pain in right ankle and joints of right foot: Secondary | ICD-10-CM | POA: Diagnosis not present

## 2015-08-15 DIAGNOSIS — M25561 Pain in right knee: Secondary | ICD-10-CM | POA: Diagnosis not present

## 2015-08-15 DIAGNOSIS — R531 Weakness: Secondary | ICD-10-CM | POA: Diagnosis not present

## 2015-08-16 DIAGNOSIS — M25561 Pain in right knee: Secondary | ICD-10-CM | POA: Diagnosis not present

## 2015-08-16 DIAGNOSIS — M25571 Pain in right ankle and joints of right foot: Secondary | ICD-10-CM | POA: Diagnosis not present

## 2015-08-16 DIAGNOSIS — M25562 Pain in left knee: Secondary | ICD-10-CM | POA: Diagnosis not present

## 2015-08-16 DIAGNOSIS — R270 Ataxia, unspecified: Secondary | ICD-10-CM | POA: Diagnosis not present

## 2015-08-16 DIAGNOSIS — M810 Age-related osteoporosis without current pathological fracture: Secondary | ICD-10-CM | POA: Diagnosis not present

## 2015-08-16 DIAGNOSIS — R531 Weakness: Secondary | ICD-10-CM | POA: Diagnosis not present

## 2015-08-17 DIAGNOSIS — M810 Age-related osteoporosis without current pathological fracture: Secondary | ICD-10-CM | POA: Diagnosis not present

## 2015-08-17 DIAGNOSIS — M25571 Pain in right ankle and joints of right foot: Secondary | ICD-10-CM | POA: Diagnosis not present

## 2015-08-17 DIAGNOSIS — M25562 Pain in left knee: Secondary | ICD-10-CM | POA: Diagnosis not present

## 2015-08-17 DIAGNOSIS — M25561 Pain in right knee: Secondary | ICD-10-CM | POA: Diagnosis not present

## 2015-08-17 DIAGNOSIS — R270 Ataxia, unspecified: Secondary | ICD-10-CM | POA: Diagnosis not present

## 2015-08-17 DIAGNOSIS — R531 Weakness: Secondary | ICD-10-CM | POA: Diagnosis not present

## 2015-08-18 DIAGNOSIS — M25561 Pain in right knee: Secondary | ICD-10-CM | POA: Diagnosis not present

## 2015-08-18 DIAGNOSIS — M25571 Pain in right ankle and joints of right foot: Secondary | ICD-10-CM | POA: Diagnosis not present

## 2015-08-18 DIAGNOSIS — M25562 Pain in left knee: Secondary | ICD-10-CM | POA: Diagnosis not present

## 2015-08-18 DIAGNOSIS — M810 Age-related osteoporosis without current pathological fracture: Secondary | ICD-10-CM | POA: Diagnosis not present

## 2015-08-18 DIAGNOSIS — R270 Ataxia, unspecified: Secondary | ICD-10-CM | POA: Diagnosis not present

## 2015-08-18 DIAGNOSIS — R531 Weakness: Secondary | ICD-10-CM | POA: Diagnosis not present

## 2015-08-21 DIAGNOSIS — R270 Ataxia, unspecified: Secondary | ICD-10-CM | POA: Diagnosis not present

## 2015-08-21 DIAGNOSIS — M25562 Pain in left knee: Secondary | ICD-10-CM | POA: Diagnosis not present

## 2015-08-21 DIAGNOSIS — R531 Weakness: Secondary | ICD-10-CM | POA: Diagnosis not present

## 2015-08-21 DIAGNOSIS — M25561 Pain in right knee: Secondary | ICD-10-CM | POA: Diagnosis not present

## 2015-08-21 DIAGNOSIS — M810 Age-related osteoporosis without current pathological fracture: Secondary | ICD-10-CM | POA: Diagnosis not present

## 2015-08-21 DIAGNOSIS — M25571 Pain in right ankle and joints of right foot: Secondary | ICD-10-CM | POA: Diagnosis not present

## 2015-08-22 DIAGNOSIS — R270 Ataxia, unspecified: Secondary | ICD-10-CM | POA: Diagnosis not present

## 2015-08-22 DIAGNOSIS — M25562 Pain in left knee: Secondary | ICD-10-CM | POA: Diagnosis not present

## 2015-08-22 DIAGNOSIS — M810 Age-related osteoporosis without current pathological fracture: Secondary | ICD-10-CM | POA: Diagnosis not present

## 2015-08-22 DIAGNOSIS — R531 Weakness: Secondary | ICD-10-CM | POA: Diagnosis not present

## 2015-08-22 DIAGNOSIS — M25571 Pain in right ankle and joints of right foot: Secondary | ICD-10-CM | POA: Diagnosis not present

## 2015-08-22 DIAGNOSIS — M25561 Pain in right knee: Secondary | ICD-10-CM | POA: Diagnosis not present

## 2015-08-23 DIAGNOSIS — M25571 Pain in right ankle and joints of right foot: Secondary | ICD-10-CM | POA: Diagnosis not present

## 2015-08-23 DIAGNOSIS — M25562 Pain in left knee: Secondary | ICD-10-CM | POA: Diagnosis not present

## 2015-08-23 DIAGNOSIS — R531 Weakness: Secondary | ICD-10-CM | POA: Diagnosis not present

## 2015-08-23 DIAGNOSIS — M25561 Pain in right knee: Secondary | ICD-10-CM | POA: Diagnosis not present

## 2015-08-23 DIAGNOSIS — R270 Ataxia, unspecified: Secondary | ICD-10-CM | POA: Diagnosis not present

## 2015-08-23 DIAGNOSIS — M810 Age-related osteoporosis without current pathological fracture: Secondary | ICD-10-CM | POA: Diagnosis not present

## 2015-08-24 DIAGNOSIS — R270 Ataxia, unspecified: Secondary | ICD-10-CM | POA: Diagnosis not present

## 2015-08-24 DIAGNOSIS — M25562 Pain in left knee: Secondary | ICD-10-CM | POA: Diagnosis not present

## 2015-08-24 DIAGNOSIS — M25561 Pain in right knee: Secondary | ICD-10-CM | POA: Diagnosis not present

## 2015-08-24 DIAGNOSIS — M25571 Pain in right ankle and joints of right foot: Secondary | ICD-10-CM | POA: Diagnosis not present

## 2015-08-24 DIAGNOSIS — M810 Age-related osteoporosis without current pathological fracture: Secondary | ICD-10-CM | POA: Diagnosis not present

## 2015-08-24 DIAGNOSIS — R531 Weakness: Secondary | ICD-10-CM | POA: Diagnosis not present

## 2015-08-28 ENCOUNTER — Telehealth: Payer: Self-pay | Admitting: *Deleted

## 2015-08-28 DIAGNOSIS — R531 Weakness: Secondary | ICD-10-CM | POA: Diagnosis not present

## 2015-08-28 DIAGNOSIS — M25571 Pain in right ankle and joints of right foot: Secondary | ICD-10-CM | POA: Diagnosis not present

## 2015-08-28 DIAGNOSIS — M810 Age-related osteoporosis without current pathological fracture: Secondary | ICD-10-CM | POA: Diagnosis not present

## 2015-08-28 DIAGNOSIS — M25562 Pain in left knee: Secondary | ICD-10-CM | POA: Diagnosis not present

## 2015-08-28 DIAGNOSIS — M25561 Pain in right knee: Secondary | ICD-10-CM | POA: Diagnosis not present

## 2015-08-28 DIAGNOSIS — R270 Ataxia, unspecified: Secondary | ICD-10-CM | POA: Diagnosis not present

## 2015-08-28 NOTE — Telephone Encounter (Signed)
Pt's husband walked in office today. Pt at home. Home health nurse came out today and checked bp three times 179/98. Taking coreg 12.5mg  one bid. Pt's husband states she has not taken today. No chest pain of headache today. Had some chest pain about 2 days ago. Consult with dr Richardson Landry. Take bp med as prescribed. Office visit later this week for bp check up. If chest pain returns go directly to ed. Discussed with pt's husbands. He verbalized understanding. Transferred to front to schedule follow up visit this week.

## 2015-08-30 DIAGNOSIS — M25561 Pain in right knee: Secondary | ICD-10-CM | POA: Diagnosis not present

## 2015-08-30 DIAGNOSIS — M25571 Pain in right ankle and joints of right foot: Secondary | ICD-10-CM | POA: Diagnosis not present

## 2015-08-30 DIAGNOSIS — M810 Age-related osteoporosis without current pathological fracture: Secondary | ICD-10-CM | POA: Diagnosis not present

## 2015-08-30 DIAGNOSIS — M25562 Pain in left knee: Secondary | ICD-10-CM | POA: Diagnosis not present

## 2015-08-30 DIAGNOSIS — R270 Ataxia, unspecified: Secondary | ICD-10-CM | POA: Diagnosis not present

## 2015-08-30 DIAGNOSIS — R531 Weakness: Secondary | ICD-10-CM | POA: Diagnosis not present

## 2015-08-31 ENCOUNTER — Ambulatory Visit (INDEPENDENT_AMBULATORY_CARE_PROVIDER_SITE_OTHER): Payer: Medicare Other | Admitting: Family Medicine

## 2015-08-31 ENCOUNTER — Encounter: Payer: Self-pay | Admitting: Family Medicine

## 2015-08-31 VITALS — BP 126/82 | Ht 68.0 in | Wt 236.0 lb

## 2015-08-31 DIAGNOSIS — M25571 Pain in right ankle and joints of right foot: Secondary | ICD-10-CM | POA: Diagnosis not present

## 2015-08-31 DIAGNOSIS — M25561 Pain in right knee: Secondary | ICD-10-CM | POA: Diagnosis not present

## 2015-08-31 DIAGNOSIS — R531 Weakness: Secondary | ICD-10-CM | POA: Diagnosis not present

## 2015-08-31 DIAGNOSIS — I10 Essential (primary) hypertension: Secondary | ICD-10-CM | POA: Diagnosis not present

## 2015-08-31 DIAGNOSIS — M25562 Pain in left knee: Secondary | ICD-10-CM | POA: Diagnosis not present

## 2015-08-31 DIAGNOSIS — E039 Hypothyroidism, unspecified: Secondary | ICD-10-CM

## 2015-08-31 DIAGNOSIS — R270 Ataxia, unspecified: Secondary | ICD-10-CM | POA: Diagnosis not present

## 2015-08-31 DIAGNOSIS — M810 Age-related osteoporosis without current pathological fracture: Secondary | ICD-10-CM | POA: Diagnosis not present

## 2015-08-31 NOTE — Patient Instructions (Signed)
Life source bp monitor upper arm, large cuff, can get at [facility] on same dose blood pressure medicines

## 2015-08-31 NOTE — Progress Notes (Signed)
   Subjective:    Patient ID: Cynthia Marks, female    DOB: Feb 01, 1933, 82 y.o.   MRN: [id number]  Hypertension This is a chronic problem. The current episode started more than 1 year ago. Associated symptoms include palpitations. Treatments tried: coreg   Patient presents with concerns of blood pressure has been elevated. Was taken earlier in the week and numbers were up through the home health nurses. At that time however she did not take her medication that morning.  Review of records shows an adjustment in thyroid dose. No symptoms of high or low thyroid at this time. TSH needs to be re-evaluated    Review of Systems  Cardiovascular: Positive for palpitations.   No chest pain no headache no back pain    Objective:   Physical Exam  Alert vital stable blood pressure very good on repeat 126/82. Lungs clear. Heart regular in rhythm.      Assessment & Plan:  Impression 1 hypertension clearly labile and numbers shot up when she did not take her medicine. However on repeat here control is excellent and I really do not want to increase dose rationale discussed with family #2 hypothyroidism status since certain plan family using a wrist cuff advised to get upper arm life source cough at [place] popping.. Same meds. Check TSH, follow-up as regularly scheduled [facility]

## 2015-09-01 LAB — TSH: TSH: 6.17 u[IU]/mL — AB (ref 0.450–4.500)

## 2015-10-02 DIAGNOSIS — H353132 Nonexudative age-related macular degeneration, bilateral, intermediate dry stage: Secondary | ICD-10-CM | POA: Diagnosis not present

## 2015-10-02 DIAGNOSIS — E119 Type 2 diabetes mellitus without complications: Secondary | ICD-10-CM | POA: Diagnosis not present

## 2015-10-02 DIAGNOSIS — H43811 Vitreous degeneration, right eye: Secondary | ICD-10-CM | POA: Diagnosis not present

## 2015-10-02 DIAGNOSIS — H35363 Drusen (degenerative) of macula, bilateral: Secondary | ICD-10-CM | POA: Diagnosis not present

## 2015-10-22 ENCOUNTER — Other Ambulatory Visit: Payer: Self-pay | Admitting: Family Medicine

## 2015-10-24 ENCOUNTER — Ambulatory Visit (INDEPENDENT_AMBULATORY_CARE_PROVIDER_SITE_OTHER): Payer: Medicare Other | Admitting: Family Medicine

## 2015-10-24 ENCOUNTER — Encounter: Payer: Self-pay | Admitting: Family Medicine

## 2015-10-24 VITALS — BP 128/84 | Ht 68.0 in | Wt 236.0 lb

## 2015-10-24 DIAGNOSIS — I10 Essential (primary) hypertension: Secondary | ICD-10-CM | POA: Diagnosis not present

## 2015-10-24 DIAGNOSIS — E039 Hypothyroidism, unspecified: Secondary | ICD-10-CM | POA: Diagnosis not present

## 2015-10-24 DIAGNOSIS — E119 Type 2 diabetes mellitus without complications: Secondary | ICD-10-CM

## 2015-10-24 DIAGNOSIS — Z23 Encounter for immunization: Secondary | ICD-10-CM

## 2015-10-24 DIAGNOSIS — M810 Age-related osteoporosis without current pathological fracture: Secondary | ICD-10-CM

## 2015-10-24 LAB — POCT GLYCOSYLATED HEMOGLOBIN (HGB A1C): Hemoglobin A1C: 5.3

## 2015-10-24 NOTE — Progress Notes (Signed)
   Subjective:    Patient ID: Cynthia Marks, female    DOB: [birth year], 82 y.o.   MRN: [id number]  Diabetes  She presents for her follow-up diabetic visit. She has type 2 diabetes mellitus. Pertinent negatives for hypoglycemia include no confusion. Associated symptoms include fatigue. Pertinent negatives for diabetes include no chest pain, no polydipsia, no polyphagia and no weakness. Risk factors for coronary artery disease include diabetes mellitus, dyslipidemia, hypertension, obesity, post-menopausal and sedentary lifestyle. Current diabetic treatment includes oral agent (monotherapy). She is compliant with treatment all of the time. Her weight is stable. She is following a diabetic diet.   Patient feels like her thyroid med is too strong She states at times she feels medicines too strong she denies any particular reason for this feeling. She does take her cholesterol medicine and diabetes medicine denies any low sugar spells She does try to eat somewhat healthy. She does try to do some walking but she is limited because of her obesity arthritis in her previous ankle fractures. Patient does take her osteoporosis medication she is due for bone density  Review of Systems  Constitutional: Positive for fatigue. Negative for activity change and appetite change.  HENT: Negative for congestion.   Respiratory: Negative for cough.   Cardiovascular: Negative for chest pain.  Gastrointestinal: Negative for abdominal pain.  Endocrine: Negative for polydipsia and polyphagia.  Neurological: Negative for weakness.  Psychiatric/Behavioral: Negative for confusion.       Objective:   Physical Exam  Constitutional: She appears well-nourished. No distress.  Cardiovascular: Normal rate, regular rhythm and normal heart sounds.   No murmur heard. Pulmonary/Chest: Effort normal and breath sounds normal. No respiratory distress.  Musculoskeletal: She exhibits no edema.  Lymphadenopathy:    She has no  cervical adenopathy.  Neurological: She is alert. She exhibits normal muscle tone.  Psychiatric: Her behavior is normal.  Vitals reviewed.  25 minutes was spent with the patient. Greater than half the time was spent in discussion and answering questions and counseling regarding the issues that the patient came in for today.        Assessment & Plan:  Diabetes-very good control continue current medication watch diet stay active  Thyroid I believe she is under better control now but we will check lab work and then adjust according to the results  Hyperlipidemia previous lab work reviewed with the patient patient will need further lab work within approximately 46 months  Osteoporosis check bone density may or may not need continued Fosamax. Also depends on her total length of time taking it. Was guidelines recommend 5 years of taking a medicine been taking a break from  [place] having difficult time getting around because of her weight in her previous ankle fractures she will follow-up here in 4 months time

## 2015-10-25 ENCOUNTER — Encounter: Payer: Self-pay | Admitting: *Deleted

## 2015-10-25 LAB — BASIC METABOLIC PANEL
BUN/Creatinine Ratio: 20 (ref 12–28)
BUN: 20 mg/dL (ref 8–27)
CHLORIDE: 99 mmol/L (ref 96–106)
CO2: 23 mmol/L (ref 18–29)
Calcium: 10.7 mg/dL — ABNORMAL HIGH (ref 8.7–10.3)
Creatinine, Ser: 1.02 mg/dL — ABNORMAL HIGH (ref 0.57–1.00)
GFR calc Af Amer: 60 mL/min/{1.73_m2} (ref >59)
GFR calc non Af Amer: 52 mL/min/{1.73_m2} — ABNORMAL LOW (ref >59)
GLUCOSE: 121 mg/dL — AB (ref 65–99)
POTASSIUM: 5.3 mmol/L — AB (ref 3.5–5.2)
SODIUM: 139 mmol/L (ref 134–144)

## 2015-10-25 LAB — T4, FREE: FREE T4: 1.58 ng/dL (ref 0.82–1.77)

## 2015-10-25 LAB — TSH: TSH: 0.715 u[IU]/mL (ref 0.450–4.500)

## 2015-10-26 ENCOUNTER — Ambulatory Visit (HOSPITAL_COMMUNITY)
Admission: RE | Admit: 2015-10-26 | Discharge: 2015-10-26 | Disposition: A | Discharge status: Home / Self Care | Payer: Medicare Other | Source: Ambulatory Visit | Attending: Family Medicine | Admitting: Family Medicine

## 2015-10-26 DIAGNOSIS — M8588 Other specified disorders of bone density and structure, other site: Secondary | ICD-10-CM | POA: Diagnosis not present

## 2015-10-26 DIAGNOSIS — M810 Age-related osteoporosis without current pathological fracture: Secondary | ICD-10-CM | POA: Diagnosis present

## 2015-10-26 DIAGNOSIS — M85851 Other specified disorders of bone density and structure, right thigh: Secondary | ICD-10-CM | POA: Insufficient documentation

## 2015-12-01 ENCOUNTER — Telehealth: Payer: Self-pay

## 2015-12-01 ENCOUNTER — Ambulatory Visit: Payer: Medicare Other | Admitting: Family Medicine

## 2015-12-01 MED ORDER — CEFPROZIL 500 MG PO TABS: 500.0000 mg | ORAL_TABLET | Freq: Two times a day (BID) | ORAL | 0 refills | Status: DC

## 2015-12-01 NOTE — Telephone Encounter (Signed)
Patient is having dysuria and sweating a lot x 2 days . No fever or abdominal pain noted. [facility]. Patient sends her thanks to Dr. Nicki Reaper.

## 2015-12-01 NOTE — Telephone Encounter (Signed)
Patient advised that the medication has been sent to the pharmacy and to follow up if continued issues. Patient verbalized understanding.

## 2015-12-01 NOTE — Telephone Encounter (Signed)
[facility] (mailbox full) med sent in

## 2015-12-01 NOTE — Telephone Encounter (Signed)
Cefzil 500 mg 1 twice a day for 7 days, this patient is taken shot Rocephin before in the past therefore feel the patient should be on a tolerate cephalosporins without difficulty if any allergic reaction to it stop the medicine notify us if patient not getting better over the course of the next several days needs office visit

## 2015-12-12 DIAGNOSIS — B351 Tinea unguium: Secondary | ICD-10-CM | POA: Diagnosis not present

## 2015-12-12 DIAGNOSIS — E1342 Other specified diabetes mellitus with diabetic polyneuropathy: Secondary | ICD-10-CM | POA: Diagnosis not present

## 2015-12-17 ENCOUNTER — Other Ambulatory Visit: Payer: Self-pay | Admitting: Family Medicine

## 2016-02-23 ENCOUNTER — Ambulatory Visit: Payer: Medicare Other | Admitting: Family Medicine

## 2016-03-12 ENCOUNTER — Ambulatory Visit (INDEPENDENT_AMBULATORY_CARE_PROVIDER_SITE_OTHER): Payer: Medicare Other | Admitting: Family Medicine

## 2016-03-12 ENCOUNTER — Encounter: Payer: Self-pay | Admitting: Family Medicine

## 2016-03-12 VITALS — BP 130/76 | Ht 68.0 in | Wt 215.8 lb

## 2016-03-12 DIAGNOSIS — E784 Other hyperlipidemia: Secondary | ICD-10-CM | POA: Diagnosis not present

## 2016-03-12 DIAGNOSIS — E119 Type 2 diabetes mellitus without complications: Secondary | ICD-10-CM | POA: Diagnosis not present

## 2016-03-12 DIAGNOSIS — M810 Age-related osteoporosis without current pathological fracture: Secondary | ICD-10-CM

## 2016-03-12 DIAGNOSIS — E038 Other specified hypothyroidism: Secondary | ICD-10-CM

## 2016-03-12 DIAGNOSIS — I10 Essential (primary) hypertension: Secondary | ICD-10-CM

## 2016-03-12 DIAGNOSIS — E7849 Other hyperlipidemia: Secondary | ICD-10-CM

## 2016-03-12 LAB — POCT GLYCOSYLATED HEMOGLOBIN (HGB A1C): Hemoglobin A1C: 5.1

## 2016-03-12 MED ORDER — CARVEDILOL 12.5 MG PO TABS: 12.5000 mg | ORAL_TABLET | Freq: Two times a day (BID) | ORAL | 2 refills | Status: DC

## 2016-03-12 MED ORDER — ALENDRONATE SODIUM 70 MG PO TABS: ORAL_TABLET | ORAL | 2 refills | Status: DC

## 2016-03-12 MED ORDER — ROSUVASTATIN CALCIUM 20 MG PO TABS: 20.0000 mg | ORAL_TABLET | Freq: Every day | ORAL | 2 refills | Status: DC

## 2016-03-12 MED ORDER — LEVOTHYROXINE SODIUM 150 MCG PO TABS: ORAL_TABLET | ORAL | 2 refills | Status: DC

## 2016-03-12 MED ORDER — CEFPROZIL 500 MG PO TABS: 500.0000 mg | ORAL_TABLET | Freq: Two times a day (BID) | ORAL | 0 refills | Status: DC

## 2016-03-12 MED ORDER — SITAGLIPTIN PHOSPHATE 50 MG PO TABS: 50.0000 mg | ORAL_TABLET | Freq: Every day | ORAL | 2 refills | Status: DC

## 2016-03-12 NOTE — Progress Notes (Signed)
Subjective:    Patient ID: Cynthia Marks, female    DOB: September 03, 1932, 82 y.o.   MRN: [id number]  Diabetes  She presents for her follow-up diabetic visit. She has type 2 diabetes mellitus. Pertinent negatives for hypoglycemia include no confusion. Pertinent negatives for diabetes include no chest pain, no fatigue, no polydipsia, no polyphagia and no weakness. Risk factors for coronary artery disease include diabetes mellitus, post-menopausal and sedentary lifestyle. Current diabetic treatment includes oral agent (monotherapy). She is compliant with treatment all of the time. Her weight is stable. She is following a diabetic diet.  Patient has osteoporosis takes her medicine on a regular basis tolerating it well Takes her thyroid medicine on a regular basis denies any particular troubles Does take her heart medicine denies any troubles they are Takes her cholesterol medicine but thinks that it does cause some joint pains in her knees she would like to try stopping it this evening gets better Patient is due for new blood work within the next 60 days Cold symptoms   Review of Systems  Constitutional: Negative for activity change, appetite change and fatigue.  HENT: Positive for congestion and rhinorrhea.   Respiratory: Positive for cough.   Cardiovascular: Negative for chest pain.  Gastrointestinal: Negative for abdominal pain.  Endocrine: Negative for polydipsia and polyphagia.  Neurological: Negative for weakness.  Psychiatric/Behavioral: Negative for confusion.       Objective:   Physical Exam  Constitutional: She appears well-nourished. No distress.  Cardiovascular: Normal rate, regular rhythm and normal heart sounds.   No murmur heard. Pulmonary/Chest: Effort normal and breath sounds normal. No respiratory distress.  Musculoskeletal: She exhibits no edema.  Lymphadenopathy:    She has no cervical adenopathy.  Neurological: She is alert. She exhibits normal muscle tone.    Psychiatric: Her behavior is normal.  Vitals reviewed.         Assessment & Plan:  The patient was seen today as part of a comprehensive visit for diabetes. Her A1c overall looks great. She will continue her medication. The importance of keeping her A1c at or below 7 was discussed. Importance of regular physical activity was discussed. Proper monitoring of glucose levels with glucometer discussed. The importance of adherence to medication as well as a controlled low starch/sugar diet was also discussed. Also discussion regarding the importance of diabetic foot checks including self check every day. Also yearly diabetic eye exams recommended. The importance of keeping blood pressure under control and keeping LDL below 70 was also discussed. Also the importance of avoiding smoking. Standard follow-up visit recommended. Finally failure to follow good diabetic measures including self effort and compliance with recommendations can certainly increase the risk of heart disease strokes kidney failure blindness loss of limb and early death was discussed with the patient.  HTN- Patient was seen today as part of a visit regarding hypertension. The importance of healthy diet and regular physical activity was discussed. The importance of compliance with medications discussed. Ideal goal is to keep blood pressure low elevated levels certainly below [id number] when possible. The patient was counseled that keeping blood pressure under control lessen his risk of heart attack, stroke, kidney failure, and early death. The importance of regular follow-ups was discussed with the patient. Low-salt diet such as DASH recommended. Regular physical activity was recommended as well. Patient was advised to keep regular follow-ups.  The patient was seen today as part of an evaluation regarding hyperlipidemia. Recent lab work has been reviewed with the patient as well  as the goals for good cholesterol care. In addition to this  medications have been discussed the importance of compliance with diet and medications discussed as well. Patient has been informed of potential side effects of medications in the importance to notify us should any problems occur. Finally the patient is aware that poor control of cholesterol, noncompliance can dramatically increase her risk of heart attack strokes and premature death. The patient will keep regular office visits and the patient does agreed to periodic lab work. The patient was concerned that possibly the medicine was causing side effects of therefore we discussed stopping Crestor for the next 2 weeks if this dramatically helps her discomfort then it is possible we may need to try some different she will let us know if it does not dramatically decrease the need discomfort she will resume the medicine  Hypothyroidism Patient will continue medication as is her she will recheck lab work within the next 60 days Previous labs reviewed with the patient she will check lipid liver within 60 days  She will follow-up within 5-6 months  Morbid obesity patient is trying to lose weight by watching how she eats she is helping herself by doing so.

## 2016-05-09 DIAGNOSIS — E038 Other specified hypothyroidism: Secondary | ICD-10-CM | POA: Diagnosis not present

## 2016-05-09 DIAGNOSIS — I10 Essential (primary) hypertension: Secondary | ICD-10-CM | POA: Diagnosis not present

## 2016-05-09 DIAGNOSIS — E784 Other hyperlipidemia: Secondary | ICD-10-CM | POA: Diagnosis not present

## 2016-05-10 ENCOUNTER — Encounter: Payer: Self-pay | Admitting: Family Medicine

## 2016-05-10 LAB — HEPATIC FUNCTION PANEL
ALBUMIN: 3.6 g/dL (ref 3.5–4.7)
ALK PHOS: 59 IU/L (ref 39–117)
ALT: 6 IU/L (ref 0–32)
AST: 11 IU/L (ref 0–40)
Bilirubin Total: 0.4 mg/dL (ref 0.0–1.2)
Bilirubin, Direct: 0.1 mg/dL (ref 0.00–0.40)
Total Protein: 7.4 g/dL (ref 6.0–8.5)

## 2016-05-10 LAB — TSH: TSH: 0.836 u[IU]/mL (ref 0.450–4.500)

## 2016-05-10 LAB — LIPID PANEL
CHOLESTEROL TOTAL: 147 mg/dL (ref 100–199)
Chol/HDL Ratio: 4.1 (ref 0.0–4.4)
HDL: 36 mg/dL — ABNORMAL LOW (ref >39)
LDL Calculated: 92 (ref 0–99)
Triglycerides: 95 mg/dL (ref 0–149)
VLDL CHOLESTEROL CAL: 19 (ref 5–40)

## 2016-05-10 LAB — BASIC METABOLIC PANEL
BUN/Creatinine Ratio: 25 (ref 12–28)
BUN: 26 mg/dL (ref 8–27)
CO2: 21 mmol/L (ref 18–29)
Calcium: 10.4 mg/dL — ABNORMAL HIGH (ref 8.7–10.3)
Chloride: 106 mmol/L (ref 96–106)
Creatinine, Ser: 1.04 mg/dL — ABNORMAL HIGH (ref 0.57–1.00)
GFR calc Af Amer: 58 — ABNORMAL LOW (ref >59)
GFR, EST NON AFRICAN AMERICAN: 51 — AB (ref >59)
Glucose: 85 mg/dL (ref 65–99)
POTASSIUM: 5.4 mmol/L — AB (ref 3.5–5.2)
SODIUM: 143 mmol/L (ref 134–144)

## 2016-08-01 DIAGNOSIS — H353112 Nonexudative age-related macular degeneration, right eye, intermediate dry stage: Secondary | ICD-10-CM | POA: Diagnosis not present

## 2016-08-01 DIAGNOSIS — H353223 Exudative age-related macular degeneration, left eye, with inactive scar: Secondary | ICD-10-CM | POA: Diagnosis not present

## 2016-08-01 DIAGNOSIS — H43811 Vitreous degeneration, right eye: Secondary | ICD-10-CM | POA: Diagnosis not present

## 2016-08-01 DIAGNOSIS — H353124 Nonexudative age-related macular degeneration, left eye, advanced atrophic with subfoveal involvement: Secondary | ICD-10-CM | POA: Diagnosis not present

## 2016-08-01 LAB — HM DIABETES EYE EXAM

## 2016-08-08 ENCOUNTER — Ambulatory Visit: Payer: Medicare Other | Admitting: Family Medicine

## 2016-08-09 ENCOUNTER — Ambulatory Visit (INDEPENDENT_AMBULATORY_CARE_PROVIDER_SITE_OTHER): Payer: Medicare Other | Admitting: Family Medicine

## 2016-08-09 ENCOUNTER — Encounter: Payer: Self-pay | Admitting: Family Medicine

## 2016-08-09 VITALS — BP 116/80 | Ht 68.0 in | Wt 193.0 lb

## 2016-08-09 DIAGNOSIS — E119 Type 2 diabetes mellitus without complications: Secondary | ICD-10-CM

## 2016-08-09 DIAGNOSIS — E038 Other specified hypothyroidism: Secondary | ICD-10-CM | POA: Diagnosis not present

## 2016-08-09 DIAGNOSIS — I10 Essential (primary) hypertension: Secondary | ICD-10-CM | POA: Diagnosis not present

## 2016-08-09 DIAGNOSIS — D6489 Other specified anemias: Secondary | ICD-10-CM

## 2016-08-09 DIAGNOSIS — M6740 Ganglion, unspecified site: Secondary | ICD-10-CM | POA: Diagnosis not present

## 2016-08-09 LAB — POCT GLYCOSYLATED HEMOGLOBIN (HGB A1C): Hemoglobin A1C: 5.1

## 2016-08-09 MED ORDER — CARVEDILOL 12.5 MG PO TABS: ORAL_TABLET | ORAL | 1 refills | Status: DC

## 2016-08-09 NOTE — Progress Notes (Signed)
   Subjective:    Patient ID: Cynthia Marks, female    DOB: 11/09/1932, 83 y.o.   MRN: [id number]  Diabetes  She presents for her follow-up diabetic visit. She has type 2 diabetes mellitus. She sees a podiatrist.Eye exam is current (May 2018).  Patient denies any low sugar spells She denies any sweats or chills She does get fatigued and tired when she stands up Having a more difficult time getting around States her weight has been coming down since her thyroid medicine was adjusted She is due for blood work She states she's eating okay bowels are moving okay Patient states that she is not experiencing low sugar spells that she knows Patient has concerns of nodule to left hand and difficulty getting around.    Review of Systems She denies high fever chills sweats denies wheezing difficulty breathing    Objective:   Physical Exam Lungs clear hearts regular HEENT benign extremities no edema diabetic foot exam normal Patient has ganglion cyst on the back of her left hand but I do not recommend any type of surgery forward it's not bothering her at this point  25 minutes was spent with the patient. Greater than half the time was spent in discussion and answering questions and counseling regarding the issues that the patient came in for today.     Assessment & Plan:  Diabetes A1c looks great stop Januvia Follow sugars periodically If they are moderately elevated to notify us Hypothyroidism check TSH continue medication Fatigue tiredness history of anemia check CBC Weight loss related to eating healthyAnd possibly due to her thyroid being corrected I doubt serious underlying disease Hypercalcemia check calcium and PTH After lab work is back we may need to adjust dosing on medicine

## 2016-08-10 LAB — CBC WITH DIFFERENTIAL/PLATELET
Basophils Absolute: 0.1 10*3/uL (ref 0.0–0.2)
Basos: 1 %
EOS (ABSOLUTE): 0.3 10*3/uL (ref 0.0–0.4)
Eos: 3 %
Hematocrit: 36.7 % (ref 34.0–46.6)
Hemoglobin: 11.4 g/dL (ref 11.1–15.9)
Immature Grans (Abs): 0 10*3/uL (ref 0.0–0.1)
Immature Granulocytes: 0 %
LYMPHS ABS: 2.1 10*3/uL (ref 0.7–3.1)
Lymphs: 19 %
MCH: 29.4 pg (ref 26.6–33.0)
MCHC: 31.1 g/dL — AB (ref 31.5–35.7)
MCV: 95 fL (ref 79–97)
MONOS ABS: 0.7 10*3/uL (ref 0.1–0.9)
Monocytes: 6 %
NEUTROS ABS: 7.8 10*3/uL — AB (ref 1.4–7.0)
Neutrophils: 71 %
Platelets: 286 10*3/uL (ref 150–379)
RBC: 3.88 x10E6/uL (ref 3.77–5.28)
RDW: 14.9 % (ref 12.3–15.4)
WBC: 11 10*3/uL — ABNORMAL HIGH (ref 3.4–10.8)

## 2016-08-10 LAB — BASIC METABOLIC PANEL
BUN / CREAT RATIO: 23 (ref 12–28)
BUN: 25 mg/dL (ref 8–27)
CO2: 21 mmol/L (ref 18–29)
CREATININE: 1.1 mg/dL — AB (ref 0.57–1.00)
Calcium: 10.8 mg/dL — ABNORMAL HIGH (ref 8.7–10.3)
Chloride: 103 mmol/L (ref 96–106)
GFR calc Af Amer: 54 mL/min/{1.73_m2} — ABNORMAL LOW (ref >59)
GFR, EST NON AFRICAN AMERICAN: 47 mL/min/{1.73_m2} — AB (ref >59)
GLUCOSE: 110 mg/dL — AB (ref 65–99)
POTASSIUM: 5 mmol/L (ref 3.5–5.2)
SODIUM: 140 mmol/L (ref 134–144)

## 2016-08-10 LAB — TSH: TSH: 0.645 u[IU]/mL (ref 0.450–4.500)

## 2016-08-10 LAB — PTH, INTACT AND CALCIUM: PTH: 59 pg/mL (ref 15–65)

## 2016-08-19 NOTE — Addendum Note (Signed)
Addended by: Dairl Ponder on: 08/19/2016 08:39 AM   Modules accepted: Orders

## 2016-08-21 ENCOUNTER — Encounter: Payer: Self-pay | Admitting: Family Medicine

## 2016-08-30 DIAGNOSIS — B351 Tinea unguium: Secondary | ICD-10-CM | POA: Diagnosis not present

## 2016-08-30 DIAGNOSIS — E1342 Other specified diabetes mellitus with diabetic polyneuropathy: Secondary | ICD-10-CM | POA: Diagnosis not present

## 2016-09-27 ENCOUNTER — Encounter: Payer: Self-pay | Admitting: *Deleted

## 2016-09-30 ENCOUNTER — Other Ambulatory Visit: Payer: Self-pay

## 2016-09-30 ENCOUNTER — Ambulatory Visit (INDEPENDENT_AMBULATORY_CARE_PROVIDER_SITE_OTHER): Payer: Medicare Other | Admitting: "Endocrinology

## 2016-09-30 ENCOUNTER — Encounter: Payer: Self-pay | Admitting: "Endocrinology

## 2016-09-30 DIAGNOSIS — E038 Other specified hypothyroidism: Secondary | ICD-10-CM | POA: Diagnosis not present

## 2016-09-30 MED ORDER — VITAMIN D3 125 MCG (5000 UT) PO CAPS: 5000.0000 [IU] | ORAL_CAPSULE | Freq: Every day | ORAL | 0 refills | Status: DC

## 2016-09-30 NOTE — Progress Notes (Signed)
HPI  Cynthia Marks is a 83 y.o.-year-old female, referred by her PCP, Dr. Wolfgang Phoenix, for evaluation for hypercalcemia/hyperparathyroidism.  Pt was dx with hypercalcemia proximal middle 5 years ago. She was worked up and on observation with no surgical intervention with Dr. Product manager in [place].  I reviewed pt's pertinent labs: Lab Results  Component Value Date   PTH 59 08/09/2016   PTH Comment 08/09/2016   CALCIUM 10.8 (H) 08/09/2016   CALCIUM 10.4 (H) 05/09/2016   CALCIUM 10.7 (H) 10/24/2015   CALCIUM 10.8 (H) 06/26/2015   CALCIUM 10.5 (H) 09/07/2014   CALCIUM 9.9 09/13/2013   CALCIUM 10.7 (H) 12/24/2012   CALCIUM 11.2 (H) 11/26/2012   CALCIUM 10.4 08/13/2012   CALCIUM 10.5 08/12/2012   I reviewed pt's DEXA scans: Most recent showing osteopenia of her femur bilaterally.  AP Spine L1-L3 10/26/2015 81.2 Normal 0.1 1.180 g/cm2 7.9% Yes AP Spine L1-L3 07/13/2013 78.9 Normal -0.6 1.094 g/cm2 - -  DualFemur Neck Right 10/26/2015 81.2 Osteopenia -1.9 0.771 g/cm2 12.1% Yes DualFemur Neck Right 07/13/2013 78.9 Osteoporosis -2.5 0.688 g/cm2 - - she denies history of fragility fractures , but reports a 4 from 3 flights of stairs which resulted in closed fracture of her ankle and her orthopedic care.  No h/o kidney stones.  No h/o CKD. Last BUN/Cr: Lab Results  Component Value Date   BUN 25 08/09/2016   CREATININE 1.10 (H) 08/09/2016    Pt is not on HCTZ.  - She has history of  vitamin D deficiency,  currently on vitamin D and calcium combination supplement. - Her intake of beer approximately value average.   Pt does not have a FH of hypercalcemia, pituitary tumors, thyroid cancer, or osteoporosis.   I reviewed her chart and she also has a history ofosteoporosis based on her DEXA prompts 2015, diffuse arthritis, acute on chronic renal insufficiency.     Current Outpatient Prescriptions:  .  acetaminophen (TYLENOL) 650 MG CR tablet, Take 650 mg by mouth every 8 (eight)  hours as needed. pain, Disp: , Rfl:  .  beta carotene w/minerals (OCUVITE) tablet, Take 1 tablet by mouth daily. Reported on 07/24/2015, Disp: , Rfl:  .  carvedilol (COREG) 12.5 MG tablet, Take 1/2 tablet by mouth twice daily., Disp: 90 tablet, Rfl: 1 .  levothyroxine (SYNTHROID, LEVOTHROID) 150 MCG tablet, Take 2 tablets on mon, wed, and Friday. Take one and a half tablets on tues, thurs, sat, and sun, Disp: 144 tablet, Rfl: 2 .  rosuvastatin (CRESTOR) 20 MG tablet, Take 1 tablet (20 mg total) by mouth daily., Disp: 90 tablet, Rfl: 2 .  sitaGLIPtin (JANUVIA) 50 MG tablet, Take 50 mg by mouth daily., Disp: , Rfl:  .  traMADol (ULTRAM) 50 MG tablet, Take 1 tablet (50 mg total) by mouth 3 (three) times daily as needed., Disp: 90 tablet, Rfl: 4 .  blood glucose meter kit and supplies KIT, Dispense based on patient and insurance preference. Test blood sugar once daily. E11.9, Disp: 1 each, Rfl: 0 .  Cholecalciferol (VITAMIN D3) 5000 units CAPS, Take 1 capsule (5,000 Units total) by mouth daily., Disp: 90 capsule, Rfl: 0   Past Medical History:  Diagnosis Date  . Arthritis    Knees  . Diabetes mellitus   . Hypertension   . Macular degeneration   . Thyroid disease    Past Surgical History:  Procedure Laterality Date  . ABDOMINAL HYSTERECTOMY    . COLONOSCOPY    . SHOULDER SURGERY  ROS:  Constitutional: + progressive  weight gain of 8 pounds in 2 months, + fatigue, no subjective hyperthermia, no subjective hypothermia Eyes: no blurry vision, no xerophthalmia ENT: no sore throat, no nodules palpated in throat, no dysphagia/odynophagia, no hoarseness Cardiovascular: no Chest Pain, no Shortness of Breath, no palpitations, no leg swelling Respiratory: no cough, no SOB Gastrointestinal: no Nausea/Vomiting/Diarhhea Musculoskeletal:   she walks with a walker, mostly sedentary, no muscle/joint aches Skin: no rashes Neurological: +  admits to significant disequilibrium, no tremors, no numbness,  no tingling, no dizziness Psychiatric: no depression, no anxiety  PE: BP 138/77   Pulse 74   Ht 5' 8"  (1.727 m)   Wt 205 lb (93 kg)   BMI 31.17 kg/m  Wt Readings from Last 3 Encounters:  09/30/16 205 lb (93 kg)  08/09/16 193 lb (87.5 kg)  03/12/16 215 lb 12.8 oz (97.9 kg)   Constitutional: Significantly over weight for hight, not in acute distress, normal state of mind, + uses her walker to get around  Eyes: PERRLA, EOMI, no exophthalmos ENT: moist mucous membranes, no thyromegaly, no cervical lymphadenopathy Cardiovascular: normal precordial activity, Regular Rate and Rhythm, no Murmur/Rubs/Gallops Respiratory:  adequate breathing efforts, no gross chest deformity, Clear to auscultation bilaterally Gastrointestinal: abdomen soft, Non -tender, No distension, Bowel Sounds present Musculoskeletal: +  gross deformities of bilateral feet, strength intact in all four extremities Skin: moist, warm, no rashes Neurological: no tremor with outstretched hands, Deep tendon reflexes normal in all four extremities.  Recent Results (from the past 2160 hour(s))  HM DIABETES EYE EXAM     Status: None   Collection Time: 08/01/16 12:00 AM  Result Value Ref Range   HM Diabetic Eye Exam No Retinopathy No Retinopathy  POCT HgB A1C     Status: None   Collection Time: 08/09/16  1:55 PM  Result Value Ref Range   Hemoglobin A1C 5.1   CBC with Differential/Platelet     Status: Abnormal   Collection Time: 08/09/16  2:39 PM  Result Value Ref Range   WBC 11.0 (H) 3.4 - 10.8 x10E3/uL   RBC 3.88 3.77 - 5.28 x10E6/uL   Hemoglobin 11.4 11.1 - 15.9 g/dL   Hematocrit 36.7 34.0 - 46.6 %   MCV 95 79 - 97 fL   MCH 29.4 26.6 - 33.0 pg   MCHC 31.1 (L) 31.5 - 35.7 g/dL   RDW 14.9 12.3 - 15.4 %   Platelets 286 150 - 379 x10E3/uL   Neutrophils 71 Not Estab. %   Lymphs 19 Not Estab. %   Monocytes 6 Not Estab. %   Eos 3 Not Estab. %   Basos 1 Not Estab. %   Neutrophils Absolute 7.8 (H) 1.4 - 7.0 x10E3/uL    Lymphocytes Absolute 2.1 0.7 - 3.1 x10E3/uL   Monocytes Absolute 0.7 0.1 - 0.9 x10E3/uL   EOS (ABSOLUTE) 0.3 0.0 - 0.4 x10E3/uL   Basophils Absolute 0.1 0.0 - 0.2 x10E3/uL   Immature Granulocytes 0 Not Estab. %   Immature Grans (Abs) 0.0 0.0 - 0.1 V42V9/DG  Basic metabolic panel     Status: Abnormal   Collection Time: 08/09/16  2:39 PM  Result Value Ref Range   Glucose 110 (H) 65 - 99 mg/dL   BUN 25 8 - 27 mg/dL   Creatinine, Ser 1.10 (H) 0.57 - 1.00 mg/dL   GFR calc non Af Amer 47 (L) >59 mL/min/1.73   GFR calc Af Amer 54 (L) >59 mL/min/1.73   BUN/Creatinine Ratio 23  12 - 28   Sodium 140 134 - 144 mmol/L   Potassium 5.0 3.5 - 5.2 mmol/L   Chloride 103 96 - 106 mmol/L   CO2 21 18 - 29 mmol/L    Comment: **Effective August 26, 2016 Carbon Dioxide, Total**   reference interval will be changing to:              Age                  Female          Female      0 days   - 30 days         45 - 29        16 - 18     31 days   -  1 year         15 - 47        15 - 25      2 years  -  5 years        6 - 88        17 - 41      6 years  - 12 years        49 - 60        19 - 91                >12 years        34 - 73        20 - 29    Calcium 10.8 (H) 8.7 - 10.3 mg/dL  TSH     Status: None   Collection Time: 08/09/16  2:39 PM  Result Value Ref Range   TSH 0.645 0.450 - 4.500 uIU/mL  PTH, Intact and Calcium     Status: None   Collection Time: 08/09/16  2:39 PM  Result Value Ref Range   PTH 59 15 - 65 pg/mL   PTH Interp Comment     Comment: Interpretation                 Intact PTH    Calcium                                 (pg/mL)      (mg/dL) Normal                          15 - 65     8.6 - 10.2 Primary Hyperparathyroidism         >65          >10.2 Secondary Hyperparathyroidism       >65          <10.2 Non-Parathyroid Hypercalcemia       <65          >10.2 Hypoparathyroidism                  <15          < 8.6 Non-Parathyroid Hypocalcemia    15 - 65          < 8.6      Assessment: 1. Hypercalcemia/hyperparathyroidism  Plan: Patient has had several instances of elevated calcium, with the highest level being at 10.8 recently and as high as 11.2 in 2014.  A corresponding intact PTH level was also inappropriately normal at 58.  - Patient also  Has  hx of  vitamin D deficiency,   for which she is on vitamin D supplement.   -She has  apparent complications from hypercalcemia including: + osteoporosis, possible fragility  Fractures, renal insufficiency. No abdominal pain (other than related to diverticulitis/gastritis), depression, bone pain. - I discussed with the patient about the physiology of calcium and parathyroid hormone, and possible side effects from increased PTH, including kidney stones, osteoporosis, abdominal pain, etc.  - we discussed about the need to update her studies. - I discussed with her that we first need to stopped calcium supplement but continue with vitamin D supplement at 5000 units daily.   - I will proceed to obtain repeat PTH/calcium, 24 hour urine calcium/creatinine, serum phosphorous and magnesium levels in about 8 weeks with office visit. - Her bone density test his current enough. - If she continues to have hypercalcemia and confirm to be due to hyperparathyroidism, she will be considered for low-dose Sensipar. - At age 89 with several comorbidities, she is not ideal candidate for surgery.  2: Hypothyroidism: Based on her recent labs with her TSH was on target, I advised her to remain on levothyroxine 150 mg by mouth every morning.  - We discussed about correct intake of levothyroxine, at fasting, with water, separated by at least 30 minutes from breakfast, and separated by more than 4 hours from calcium, iron, multivitamins, acid reflux medications (PPIs). -Patient is made aware of the fact that thyroid hormone replacement is needed for life, dose to be adjusted by periodic monitoring of thyroid function tests.  3.type 2  diabetes: Well controlled With recent A1c was 5.1%. I advised her to continue Januvia 50 mg by mouth daily. She will return in 8 weeks with above labs for reevaluation.   Glade Lloyd, MD

## 2016-10-07 DIAGNOSIS — E038 Other specified hypothyroidism: Secondary | ICD-10-CM | POA: Diagnosis not present

## 2016-10-08 LAB — PTH, INTACT AND CALCIUM
Calcium: 10.2 mg/dL (ref 8.6–10.4)
PTH: 110 pg/mL — AB (ref 14–64)

## 2016-10-08 LAB — MAGNESIUM: Magnesium: 1.9 mg/dL (ref 1.5–2.5)

## 2016-10-08 LAB — PHOSPHORUS: Phosphorus: 3 mg/dL (ref 2.1–4.3)

## 2016-10-08 LAB — TSH: TSH: 1.98 m[IU]/L

## 2016-10-08 LAB — T4, FREE: Free T4: 1.2 ng/dL (ref 0.8–1.8)

## 2016-10-11 LAB — PTH-RELATED PEPTIDE: PTH-Related Protein (PTH-RP): 20 pg/mL (ref 14–27)

## 2016-11-08 DIAGNOSIS — E1142 Type 2 diabetes mellitus with diabetic polyneuropathy: Secondary | ICD-10-CM | POA: Diagnosis not present

## 2016-11-08 DIAGNOSIS — B351 Tinea unguium: Secondary | ICD-10-CM | POA: Diagnosis not present

## 2016-11-25 ENCOUNTER — Encounter: Payer: Self-pay | Admitting: "Endocrinology

## 2016-11-25 ENCOUNTER — Ambulatory Visit (INDEPENDENT_AMBULATORY_CARE_PROVIDER_SITE_OTHER): Payer: Medicare Other | Admitting: "Endocrinology

## 2016-11-25 VITALS — BP 143/78 | HR 71 | Ht 68.0 in | Wt 204.0 lb

## 2016-11-25 DIAGNOSIS — E212 Other hyperparathyroidism: Secondary | ICD-10-CM

## 2016-11-25 DIAGNOSIS — E1165 Type 2 diabetes mellitus with hyperglycemia: Secondary | ICD-10-CM

## 2016-11-25 DIAGNOSIS — E118 Type 2 diabetes mellitus with unspecified complications: Secondary | ICD-10-CM | POA: Diagnosis not present

## 2016-11-25 DIAGNOSIS — E038 Other specified hypothyroidism: Secondary | ICD-10-CM | POA: Diagnosis not present

## 2016-11-25 DIAGNOSIS — IMO0002 Reserved for concepts with insufficient information to code with codable children: Secondary | ICD-10-CM

## 2016-11-25 DIAGNOSIS — E213 Hyperparathyroidism, unspecified: Secondary | ICD-10-CM | POA: Insufficient documentation

## 2016-11-25 NOTE — Progress Notes (Signed)
HPI  Cynthia Marks is a 83 y.o.-year-old female, referred by her PCP, Dr. Wolfgang Phoenix, for  follow-up of hypercalcemia/hyperparathyroidism.  Pt was dx with hypercalcemia proximal middle 5 years ago. She was worked up and on observation with no surgical intervention with Dr. Product manager in [place]. - Her most recent labs show calcium stable at 10.2, PTH at 110. She also has state 3 CKD.   I reviewed pt's pertinent labs: Lab Results  Component Value Date   PTH 110 (H) 10/07/2016   PTH 59 08/09/2016   PTH Comment 08/09/2016   CALCIUM 10.2 10/07/2016   CALCIUM 10.8 (H) 08/09/2016   CALCIUM 10.4 (H) 05/09/2016   CALCIUM 10.7 (H) 10/24/2015   CALCIUM 10.8 (H) 06/26/2015   CALCIUM 10.5 (H) 09/07/2014   CALCIUM 9.9 09/13/2013   CALCIUM 10.7 (H) 12/24/2012   CALCIUM 11.2 (H) 11/26/2012   CALCIUM 10.4 08/13/2012   I reviewed pt's DEXA scans: Most recent showing osteopenia of her femur bilaterally.  AP Spine L1-L3 10/26/2015 81.2 Normal 0.1 1.180 g/cm2 7.9% Yes AP Spine L1-L3 07/13/2013 78.9 Normal -0.6 1.094 g/cm2 - -  DualFemur Neck Right 10/26/2015 81.4 Osteopenia -1.9 0.771 g/cm2 12.1% Yes DualFemur Neck Right 07/13/2013 78.9 Osteoporosis -2.5 0.688 g/cm2 - - she denies history of fragility fractures , but reports a 4 from 3 flights of stairs which resulted in closed fracture of her ankle and her orthopedic care.  No h/o kidney stones.  No h/o CKD. Last BUN/Cr: Lab Results  Component Value Date   BUN 25 08/09/2016   CREATININE 1.10 (H) 08/09/2016    Pt is not on HCTZ.  - She has history of  vitamin D deficiency,  currently on vitamin D. Her calcium supplement was discontinued during her last visit.   - Her intake of daily D dairy products is approximately that of average.   Pt does not have a FH of hypercalcemia, pituitary tumors, thyroid cancer, or osteoporosis.   I reviewed her chart and she also has a history ofosteoporosis based on her DEXA prompts 2015, diffuse  arthritis, acute on chronic renal insufficiency.    Current Outpatient Prescriptions:  .  acetaminophen (TYLENOL) 650 MG CR tablet, Take 650 mg by mouth every 8 (eight) hours as needed. pain, Disp: , Rfl:  .  beta carotene w/minerals (OCUVITE) tablet, Take 1 tablet by mouth daily. Reported on 07/24/2015, Disp: , Rfl:  .  blood glucose meter kit and supplies KIT, Dispense based on patient and insurance preference. Test blood sugar once daily. E11.9, Disp: 1 each, Rfl: 0 .  carvedilol (COREG) 12.5 MG tablet, Take 1/2 tablet by mouth twice daily., Disp: 90 tablet, Rfl: 1 .  Cholecalciferol (VITAMIN D3) 5000 units CAPS, Take 1 capsule (5,000 Units total) by mouth daily., Disp: 90 capsule, Rfl: 0 .  levothyroxine (SYNTHROID, LEVOTHROID) 150 MCG tablet, Take 2 tablets on mon, wed, and Friday. Take one and a half tablets on tues, thurs, sat, and sun, Disp: 144 tablet, Rfl: 2 .  rosuvastatin (CRESTOR) 20 MG tablet, Take 1 tablet (20 mg total) by mouth daily., Disp: 90 tablet, Rfl: 2 .  traMADol (ULTRAM) 50 MG tablet, Take 1 tablet (50 mg total) by mouth 3 (three) times daily as needed., Disp: 90 tablet, Rfl: 4   Past Medical History:  Diagnosis Date  . Arthritis    Knees  . Diabetes mellitus   . Hypertension   . Macular degeneration   . Thyroid disease    Past Surgical History:  Procedure  Laterality Date  . ABDOMINAL HYSTERECTOMY    . COLONOSCOPY    . SHOULDER SURGERY     ROS:  Constitutional: + Stable weight since last visit, + fatigue, no subjective hyperthermia, no subjective hypothermia Eyes: no blurry vision, no xerophthalmia ENT: no sore throat, no nodules palpated in throat, no dysphagia/odynophagia, no hoarseness Cardiovascular: no Chest Pain, no Shortness of Breath, no palpitations, no leg swelling Respiratory: no cough, no SOB Gastrointestinal: no Nausea/Vomiting/Diarhhea Musculoskeletal:   she walks with a walker, mostly sedentary, no muscle/joint aches Skin: no  rashes Neurological: +  admits to significant disequilibrium, no tremors, no numbness, no tingling, no dizziness Psychiatric: no depression, no anxiety  PE: BP (!) 143/78   Pulse 71   Ht 5' 8"  (1.727 m)   Wt 204 lb (92.5 kg)   BMI 31.02 kg/m  Wt Readings from Last 3 Encounters:  11/25/16 204 lb (92.5 kg)  09/30/16 205 lb (93 kg)  08/09/16 193 lb (87.5 kg)   Constitutional: Significantly over weight for hight, not in acute distress, normal state of mind, + uses her walker to get around  Eyes: PERRLA, EOMI, no exophthalmos ENT: moist mucous membranes, no thyromegaly, no cervical lymphadenopathy Cardiovascular: normal precordial activity, Regular Rate and Rhythm, no Murmur/Rubs/Gallops Respiratory:  adequate breathing efforts, no gross chest deformity, Clear to auscultation bilaterally Gastrointestinal: abdomen soft, Non -tender, No distension, Bowel Sounds present Musculoskeletal: +  gross deformities of bilateral feet, strength intact in all four extremities Skin: moist, warm, no rashes Neurological: no tremor with outstretched hands, Deep tendon reflexes normal in all four extremities.  Recent Results (from the past 2160 hour(s))  PTH, intact and calcium     Status: Abnormal   Collection Time: 10/07/16 11:19 AM  Result Value Ref Range   PTH 110 (H) 14 - 64 pg/mL   Calcium 10.2 8.6 - 10.4 mg/dL    Comment:   Interpretive Guide:                              Intact PTH               Calcium                              ----------               ------- Normal Parathyroid           Normal                   Normal Hypoparathyroidism           Low or Low Normal        Low Hyperparathyroidism      Primary                 Normal or High           High      Secondary               High                     Normal or Low      Tertiary                High                     High Non-Parathyroid  Hypercalcemia              Low or Low Normal        High   Phosphorus     Status: None    Collection Time: 10/07/16 11:19 AM  Result Value Ref Range   Phosphorus 3.0 2.1 - 4.3 mg/dL  Magnesium     Status: None   Collection Time: 10/07/16 11:19 AM  Result Value Ref Range   Magnesium 1.9 1.5 - 2.5 mg/dL  PTH-related peptide     Status: None   Collection Time: 10/07/16 11:19 AM  Result Value Ref Range   PTH-Related Protein (PTH-RP) 20 14 - 27 pg/mL    Comment:   This is a C-terminal PTH-RP assay. PTH-RP is useful in the differential diagnosis of hypercalcemia and levels may be elevated in patients with tumor-associated hypercalcemia. Elevated results may also be observed in patients with renal disease.   This test was developed and its analytical performance characteristics have been determined by [facility]. It has not been cleared or approved by FDA. This assay has been validated pursuant to the CLIA regulations and is used for clinical purposes.   T4, free     Status: None   Collection Time: 10/07/16 11:19 AM  Result Value Ref Range   Free T4 1.2 0.8 - 1.8 ng/dL  TSH     Status: None   Collection Time: 10/07/16 11:19 AM  Result Value Ref Range   TSH 1.98 mIU/L    Comment:   Reference Range   > or = 20 Years  0.40-4.50   Pregnancy Range First trimester  0.26-2.66 Second trimester 0.55-2.73 Third trimester  0.43-2.91       Assessment: 1. Hypercalcemia/hyperparathyroidism  Plan: Patient has had several instances of elevated calcium, with the highest level being at 10.8 recently and as high as 11.2 in 2014.  A corresponding intact PTH level was also inappropriately normal at 58.  Since her last visit, she was advised to discontinue her calcium supplement. Calcium now is normal at 10.2 with associated PTH of 110. She also has CK D stage 3, and vitamin D deficiency on  supplement.  -She has  apparent complications from hypercalcemia including: + osteoporosis, possible fragility  Fractures, renal insufficiency. No  abdominal pain (other than related to diverticulitis/gastritis), depression, bone pain. - She could not perform the 24 hour urine calcium measurement. - She will not require intervention at this time, however, she will need repeat  PTH/calcium, and office visit in 6 month. - Her bone density test is current enough. - If she is found to have hypercalcemia of greater than 1 minute grams per deciliter above up her limit of normal by next visit, she will  be considered for low-dose Sensipar. - At age 77 with several comorbidities, she is not ideal candidate for surgery.  2: Hypothyroidism: Based on her recent labs with her TSH was on target, I advised her to remain on levothyroxine 150 mg by mouth every morning.   - We discussed about correct intake of levothyroxine, at fasting, with water, separated by at least 30 minutes from breakfast, and separated by more than 4 hours from calcium, iron, multivitamins, acid reflux medications (PPIs). -Patient is made aware of the fact that thyroid hormone replacement is needed for life, dose to be adjusted by periodic monitoring of thyroid function tests.  3.type 2 diabetes: Well controlled With recent A1c was 5.1%. I advised her to discontinue Januvia.  Suggestion is made for her to avoid simple carbohydrates  from her diet including Cakes, Sweet Desserts, Ice Cream, Soda (diet and regular), Sweet Tea, Candies, Chips, Cookies, Store Bought Juices, Alcohol in Excess of  1-2 drinks a day, Artificial Sweeteners, and "Sugar-free" Products. This will help patient to have stable blood glucose profile and potentially avoid unintended weight gain.  Glade Lloyd, MD

## 2017-01-07 ENCOUNTER — Other Ambulatory Visit: Payer: Self-pay | Admitting: "Endocrinology

## 2017-01-09 ENCOUNTER — Ambulatory Visit (INDEPENDENT_AMBULATORY_CARE_PROVIDER_SITE_OTHER): Payer: Medicare Other | Admitting: Family Medicine

## 2017-01-09 ENCOUNTER — Encounter: Payer: Self-pay | Admitting: Family Medicine

## 2017-01-09 VITALS — BP 130/88 | Ht 68.0 in | Wt 203.4 lb

## 2017-01-09 DIAGNOSIS — M1711 Unilateral primary osteoarthritis, right knee: Secondary | ICD-10-CM | POA: Diagnosis not present

## 2017-01-09 DIAGNOSIS — E038 Other specified hypothyroidism: Secondary | ICD-10-CM | POA: Diagnosis not present

## 2017-01-09 DIAGNOSIS — R269 Unspecified abnormalities of gait and mobility: Secondary | ICD-10-CM

## 2017-01-09 DIAGNOSIS — I10 Essential (primary) hypertension: Secondary | ICD-10-CM | POA: Diagnosis not present

## 2017-01-09 DIAGNOSIS — Z23 Encounter for immunization: Secondary | ICD-10-CM | POA: Diagnosis not present

## 2017-01-09 MED ORDER — LEVOTHYROXINE SODIUM 150 MCG PO TABS: ORAL_TABLET | ORAL | 2 refills | Status: AC

## 2017-01-09 MED ORDER — ROSUVASTATIN CALCIUM 20 MG PO TABS: 20.0000 mg | ORAL_TABLET | Freq: Every day | ORAL | 2 refills | Status: DC

## 2017-01-09 MED ORDER — CARVEDILOL 12.5 MG PO TABS: ORAL_TABLET | ORAL | 1 refills | Status: DC

## 2017-01-09 NOTE — Progress Notes (Signed)
   Subjective:    Patient ID: Cynthia Marks, female    DOB: 1933/02/09, 83 y.o.   MRN: [id number]  Hypertension  This is a chronic problem. The current episode started more than 1 year ago. Pertinent negatives include no chest pain, headaches or shortness of breath.   Patient has concerns of right knee pain. Patient states pain is worsening.  Patient does take blood pressure medicine without difficulty watch his diet as best she can Takes her thyroid medicine regular basis denies any problems with it.  Energy level fair Relates severe right knee pain and discomfort stiffness she would like to see orthopedist for injection Significant weakness difficulty walking around she is already had one fall recently.  She does need some physical therapy she is unable to get out of her house face-to-face evaluation was done today, regarding this Patient also has cholesterol issues she does take her medicine as directed but is not due for any type of lab work currently.   Review of Systems  Constitutional: Negative for activity change, fatigue and fever.  HENT: Negative for congestion.   Respiratory: Negative for cough, chest tightness and shortness of breath.   Cardiovascular: Negative for chest pain and leg swelling.  Gastrointestinal: Negative for abdominal pain.  Skin: Negative for color change.  Neurological: Negative for headaches.  Psychiatric/Behavioral: Negative for behavioral problems.       Objective:   Physical Exam  Constitutional: She appears well-developed and well-nourished. No distress.  HENT:  Head: Normocephalic and atraumatic.  Eyes: Right eye exhibits no discharge. Left eye exhibits no discharge.  Neck: No tracheal deviation present.  Cardiovascular: Normal rate, regular rhythm and normal heart sounds.   No murmur heard. Pulmonary/Chest: Effort normal and breath sounds normal. No respiratory distress. She has no wheezes. She has no rales.  Musculoskeletal: She exhibits  no edema.  Lymphadenopathy:    She has no cervical adenopathy.  Neurological: She is alert. She exhibits normal muscle tone.  Skin: Skin is warm and dry. No erythema.  Psychiatric: Her behavior is normal.  Vitals reviewed. Face-to-face evaluation done patient does need physical therapy for gait training she has had one fall all also will need for strengthening of the legs and the arms.  25 minutes was spent with the patient. Greater than half the time was spent in discussion and answering questions and counseling regarding the issues that the patient came in for today.       Assessment & Plan:  Right knee pain referral to Dr. Aline Brochure osteoarthritis probably needs injection  Hypothyroidism previous lab work looks good continue current measures refills given  Moderate gait abnormality due to weakness in the legs and osteoarthritis recommend home physical therapy patient is homebound  Hyperlipidemia takes medicine as directed lab work not needed currently  Flu shot today  Follow-up 6 months

## 2017-01-15 ENCOUNTER — Encounter: Payer: Self-pay | Admitting: Family Medicine

## 2017-01-16 NOTE — Progress Notes (Signed)
Flu shot has been documented. 

## 2017-01-21 ENCOUNTER — Telehealth: Payer: Self-pay | Admitting: Family Medicine

## 2017-01-21 DIAGNOSIS — Z9181 History of falling: Secondary | ICD-10-CM | POA: Diagnosis not present

## 2017-01-21 DIAGNOSIS — R531 Weakness: Secondary | ICD-10-CM | POA: Diagnosis not present

## 2017-01-21 DIAGNOSIS — E039 Hypothyroidism, unspecified: Secondary | ICD-10-CM | POA: Diagnosis not present

## 2017-01-21 DIAGNOSIS — Z741 Need for assistance with personal care: Secondary | ICD-10-CM

## 2017-01-21 DIAGNOSIS — R2689 Other abnormalities of gait and mobility: Secondary | ICD-10-CM | POA: Diagnosis not present

## 2017-01-21 DIAGNOSIS — M1711 Unilateral primary osteoarthritis, right knee: Secondary | ICD-10-CM | POA: Diagnosis not present

## 2017-01-21 DIAGNOSIS — M1990 Unspecified osteoarthritis, unspecified site: Secondary | ICD-10-CM | POA: Diagnosis not present

## 2017-01-21 NOTE — Telephone Encounter (Signed)
I spoke with Suezanne Jacquet the therapist who was there at the pt's home. He states pt was seen a week or so ago and you had recommended PT for her. He thinks Pt would benefit pt as well for knee strengthening also was wondering if you could make a referral for home health to assist in bathing, Also Ot to help with small things like putting clothes on.Please advise.Thanks,CS

## 2017-01-21 NOTE — Telephone Encounter (Signed)
Requesting verbal order for physical therapy for 2 times a week for 4 weeks.  Also, would like home health aid for a bath to come in 2 times a week for 4 weeks.  Also, OT requested.

## 2017-01-23 ENCOUNTER — Telehealth: Payer: Self-pay

## 2017-01-23 DIAGNOSIS — Z9181 History of falling: Secondary | ICD-10-CM | POA: Diagnosis not present

## 2017-01-23 DIAGNOSIS — M1990 Unspecified osteoarthritis, unspecified site: Secondary | ICD-10-CM | POA: Diagnosis not present

## 2017-01-23 DIAGNOSIS — R2689 Other abnormalities of gait and mobility: Secondary | ICD-10-CM | POA: Diagnosis not present

## 2017-01-23 DIAGNOSIS — M1711 Unilateral primary osteoarthritis, right knee: Secondary | ICD-10-CM | POA: Diagnosis not present

## 2017-01-23 DIAGNOSIS — E039 Hypothyroidism, unspecified: Secondary | ICD-10-CM | POA: Diagnosis not present

## 2017-01-23 DIAGNOSIS — R531 Weakness: Secondary | ICD-10-CM | POA: Diagnosis not present

## 2017-01-23 NOTE — Telephone Encounter (Signed)
Please put in formal physical therapy and occupational therapy request I saw this patient for face-to-face evaluation most recently thank you-appears that she Artie has someone established you can do this

## 2017-01-24 NOTE — Telephone Encounter (Signed)
Done

## 2017-01-27 DIAGNOSIS — M1990 Unspecified osteoarthritis, unspecified site: Secondary | ICD-10-CM | POA: Diagnosis not present

## 2017-01-27 DIAGNOSIS — Z9181 History of falling: Secondary | ICD-10-CM | POA: Diagnosis not present

## 2017-01-27 DIAGNOSIS — M1711 Unilateral primary osteoarthritis, right knee: Secondary | ICD-10-CM | POA: Diagnosis not present

## 2017-01-27 DIAGNOSIS — E039 Hypothyroidism, unspecified: Secondary | ICD-10-CM | POA: Diagnosis not present

## 2017-01-27 DIAGNOSIS — R2689 Other abnormalities of gait and mobility: Secondary | ICD-10-CM | POA: Diagnosis not present

## 2017-01-27 DIAGNOSIS — R531 Weakness: Secondary | ICD-10-CM | POA: Diagnosis not present

## 2017-01-28 DIAGNOSIS — Z9181 History of falling: Secondary | ICD-10-CM | POA: Diagnosis not present

## 2017-01-28 DIAGNOSIS — R531 Weakness: Secondary | ICD-10-CM | POA: Diagnosis not present

## 2017-01-28 DIAGNOSIS — M1990 Unspecified osteoarthritis, unspecified site: Secondary | ICD-10-CM | POA: Diagnosis not present

## 2017-01-28 DIAGNOSIS — R2689 Other abnormalities of gait and mobility: Secondary | ICD-10-CM | POA: Diagnosis not present

## 2017-01-28 DIAGNOSIS — E039 Hypothyroidism, unspecified: Secondary | ICD-10-CM | POA: Diagnosis not present

## 2017-01-28 DIAGNOSIS — M1711 Unilateral primary osteoarthritis, right knee: Secondary | ICD-10-CM | POA: Diagnosis not present

## 2017-01-29 DIAGNOSIS — R531 Weakness: Secondary | ICD-10-CM | POA: Diagnosis not present

## 2017-01-29 DIAGNOSIS — M1711 Unilateral primary osteoarthritis, right knee: Secondary | ICD-10-CM | POA: Diagnosis not present

## 2017-01-29 DIAGNOSIS — R2689 Other abnormalities of gait and mobility: Secondary | ICD-10-CM | POA: Diagnosis not present

## 2017-01-29 DIAGNOSIS — Z9181 History of falling: Secondary | ICD-10-CM | POA: Diagnosis not present

## 2017-01-29 DIAGNOSIS — E039 Hypothyroidism, unspecified: Secondary | ICD-10-CM | POA: Diagnosis not present

## 2017-01-29 DIAGNOSIS — M1990 Unspecified osteoarthritis, unspecified site: Secondary | ICD-10-CM | POA: Diagnosis not present

## 2017-01-30 DIAGNOSIS — R531 Weakness: Secondary | ICD-10-CM | POA: Diagnosis not present

## 2017-01-30 DIAGNOSIS — E039 Hypothyroidism, unspecified: Secondary | ICD-10-CM | POA: Diagnosis not present

## 2017-01-30 DIAGNOSIS — M1711 Unilateral primary osteoarthritis, right knee: Secondary | ICD-10-CM | POA: Diagnosis not present

## 2017-01-30 DIAGNOSIS — M1990 Unspecified osteoarthritis, unspecified site: Secondary | ICD-10-CM | POA: Diagnosis not present

## 2017-01-30 DIAGNOSIS — Z9181 History of falling: Secondary | ICD-10-CM | POA: Diagnosis not present

## 2017-01-30 DIAGNOSIS — R2689 Other abnormalities of gait and mobility: Secondary | ICD-10-CM | POA: Diagnosis not present

## 2017-01-31 DIAGNOSIS — B351 Tinea unguium: Secondary | ICD-10-CM | POA: Diagnosis not present

## 2017-01-31 DIAGNOSIS — E1342 Other specified diabetes mellitus with diabetic polyneuropathy: Secondary | ICD-10-CM | POA: Diagnosis not present

## 2017-02-04 DIAGNOSIS — E039 Hypothyroidism, unspecified: Secondary | ICD-10-CM | POA: Diagnosis not present

## 2017-02-04 DIAGNOSIS — M1711 Unilateral primary osteoarthritis, right knee: Secondary | ICD-10-CM | POA: Diagnosis not present

## 2017-02-04 DIAGNOSIS — R531 Weakness: Secondary | ICD-10-CM | POA: Diagnosis not present

## 2017-02-04 DIAGNOSIS — R2689 Other abnormalities of gait and mobility: Secondary | ICD-10-CM | POA: Diagnosis not present

## 2017-02-04 DIAGNOSIS — Z9181 History of falling: Secondary | ICD-10-CM | POA: Diagnosis not present

## 2017-02-04 DIAGNOSIS — M1990 Unspecified osteoarthritis, unspecified site: Secondary | ICD-10-CM | POA: Diagnosis not present

## 2017-02-05 DIAGNOSIS — E039 Hypothyroidism, unspecified: Secondary | ICD-10-CM | POA: Diagnosis not present

## 2017-02-05 DIAGNOSIS — R2689 Other abnormalities of gait and mobility: Secondary | ICD-10-CM | POA: Diagnosis not present

## 2017-02-05 DIAGNOSIS — M1990 Unspecified osteoarthritis, unspecified site: Secondary | ICD-10-CM | POA: Diagnosis not present

## 2017-02-05 DIAGNOSIS — R531 Weakness: Secondary | ICD-10-CM | POA: Diagnosis not present

## 2017-02-05 DIAGNOSIS — Z9181 History of falling: Secondary | ICD-10-CM | POA: Diagnosis not present

## 2017-02-05 DIAGNOSIS — M1711 Unilateral primary osteoarthritis, right knee: Secondary | ICD-10-CM | POA: Diagnosis not present

## 2017-02-10 DIAGNOSIS — E039 Hypothyroidism, unspecified: Secondary | ICD-10-CM | POA: Diagnosis not present

## 2017-02-10 DIAGNOSIS — M1990 Unspecified osteoarthritis, unspecified site: Secondary | ICD-10-CM | POA: Diagnosis not present

## 2017-02-10 DIAGNOSIS — R2689 Other abnormalities of gait and mobility: Secondary | ICD-10-CM | POA: Diagnosis not present

## 2017-02-10 DIAGNOSIS — M1711 Unilateral primary osteoarthritis, right knee: Secondary | ICD-10-CM | POA: Diagnosis not present

## 2017-02-10 DIAGNOSIS — R531 Weakness: Secondary | ICD-10-CM | POA: Diagnosis not present

## 2017-02-10 DIAGNOSIS — Z9181 History of falling: Secondary | ICD-10-CM | POA: Diagnosis not present

## 2017-02-11 DIAGNOSIS — E039 Hypothyroidism, unspecified: Secondary | ICD-10-CM | POA: Diagnosis not present

## 2017-02-11 DIAGNOSIS — M1990 Unspecified osteoarthritis, unspecified site: Secondary | ICD-10-CM | POA: Diagnosis not present

## 2017-02-11 DIAGNOSIS — Z9181 History of falling: Secondary | ICD-10-CM | POA: Diagnosis not present

## 2017-02-11 DIAGNOSIS — R2689 Other abnormalities of gait and mobility: Secondary | ICD-10-CM | POA: Diagnosis not present

## 2017-02-11 DIAGNOSIS — M1711 Unilateral primary osteoarthritis, right knee: Secondary | ICD-10-CM | POA: Diagnosis not present

## 2017-02-11 DIAGNOSIS — R531 Weakness: Secondary | ICD-10-CM | POA: Diagnosis not present

## 2017-02-12 DIAGNOSIS — E039 Hypothyroidism, unspecified: Secondary | ICD-10-CM | POA: Diagnosis not present

## 2017-02-12 DIAGNOSIS — R2689 Other abnormalities of gait and mobility: Secondary | ICD-10-CM | POA: Diagnosis not present

## 2017-02-12 DIAGNOSIS — M1711 Unilateral primary osteoarthritis, right knee: Secondary | ICD-10-CM | POA: Diagnosis not present

## 2017-02-12 DIAGNOSIS — Z9181 History of falling: Secondary | ICD-10-CM | POA: Diagnosis not present

## 2017-02-12 DIAGNOSIS — M1990 Unspecified osteoarthritis, unspecified site: Secondary | ICD-10-CM | POA: Diagnosis not present

## 2017-02-12 DIAGNOSIS — R531 Weakness: Secondary | ICD-10-CM | POA: Diagnosis not present

## 2017-02-13 DIAGNOSIS — E039 Hypothyroidism, unspecified: Secondary | ICD-10-CM | POA: Diagnosis not present

## 2017-02-13 DIAGNOSIS — R531 Weakness: Secondary | ICD-10-CM | POA: Diagnosis not present

## 2017-02-13 DIAGNOSIS — M1990 Unspecified osteoarthritis, unspecified site: Secondary | ICD-10-CM | POA: Diagnosis not present

## 2017-02-13 DIAGNOSIS — M1711 Unilateral primary osteoarthritis, right knee: Secondary | ICD-10-CM | POA: Diagnosis not present

## 2017-02-13 DIAGNOSIS — Z9181 History of falling: Secondary | ICD-10-CM | POA: Diagnosis not present

## 2017-02-13 DIAGNOSIS — R2689 Other abnormalities of gait and mobility: Secondary | ICD-10-CM | POA: Diagnosis not present

## 2017-02-17 DIAGNOSIS — M1990 Unspecified osteoarthritis, unspecified site: Secondary | ICD-10-CM | POA: Diagnosis not present

## 2017-02-17 DIAGNOSIS — R2689 Other abnormalities of gait and mobility: Secondary | ICD-10-CM | POA: Diagnosis not present

## 2017-02-17 DIAGNOSIS — M1711 Unilateral primary osteoarthritis, right knee: Secondary | ICD-10-CM | POA: Diagnosis not present

## 2017-02-17 DIAGNOSIS — Z9181 History of falling: Secondary | ICD-10-CM | POA: Diagnosis not present

## 2017-02-17 DIAGNOSIS — R531 Weakness: Secondary | ICD-10-CM | POA: Diagnosis not present

## 2017-02-17 DIAGNOSIS — E039 Hypothyroidism, unspecified: Secondary | ICD-10-CM | POA: Diagnosis not present

## 2017-02-19 ENCOUNTER — Ambulatory Visit (INDEPENDENT_AMBULATORY_CARE_PROVIDER_SITE_OTHER): Payer: Medicare Other

## 2017-02-19 ENCOUNTER — Encounter: Payer: Self-pay | Admitting: Orthopedic Surgery

## 2017-02-19 ENCOUNTER — Ambulatory Visit (INDEPENDENT_AMBULATORY_CARE_PROVIDER_SITE_OTHER): Payer: Medicare Other | Admitting: Orthopedic Surgery

## 2017-02-19 VITALS — BP 199/89 | HR 67 | Ht 68.0 in | Wt 196.0 lb

## 2017-02-19 DIAGNOSIS — M25561 Pain in right knee: Secondary | ICD-10-CM

## 2017-02-19 DIAGNOSIS — M1711 Unilateral primary osteoarthritis, right knee: Secondary | ICD-10-CM

## 2017-02-19 DIAGNOSIS — M48062 Spinal stenosis, lumbar region with neurogenic claudication: Secondary | ICD-10-CM | POA: Diagnosis not present

## 2017-02-19 DIAGNOSIS — G8929 Other chronic pain: Secondary | ICD-10-CM

## 2017-02-19 NOTE — Addendum Note (Signed)
Addended byCandice Camp on: 02/19/2017 03:29 PM   Modules accepted: Orders

## 2017-02-19 NOTE — Progress Notes (Signed)
NEW PATIENT OFFICE VISIT    Chief Complaint  Patient presents with  . Knee Pain    right     83 year old female presents for evaluation of ongoing pain right knee  Right and left knee pain actually.  Dull throbbing aching pain with occasional radiation down the tibia.  Moderate to severe long-standing greater than 5 years.  Seems to be worse with ambulating worsening over time    Review of Systems  Constitutional: Negative for chills, diaphoresis, fever, malaise/fatigue and weight loss.  Musculoskeletal: Positive for back pain.       Recent bout of weakness and inability to walk treated with lower extremity physical therapy with some improvement.  Complains of bilateral leg weakness and giving way and lower back pain  Neurological: Positive for weakness. Negative for tingling, sensory change and focal weakness.     Past Medical History:  Diagnosis Date  . Arthritis    Knees  . Diabetes mellitus   . Hypertension   . Macular degeneration   . Thyroid disease     Past Surgical History:  Procedure Laterality Date  . ABDOMINAL HYSTERECTOMY    . COLONOSCOPY    . SHOULDER SURGERY      Family History  Problem Relation Age of Onset  . Heart disease Unknown   . Arthritis Unknown   . Cancer Unknown   . Diabetes Unknown   . Kidney disease Unknown    Social History   Tobacco Use  . Smoking status: Never Smoker  . Smokeless tobacco: Never Used  Substance Use Topics  . Alcohol use: No  . Drug use: No    Allergies  Allergen Reactions  . Aspirin   . Ibuprofen     Nausea or HTN  . Penicillins      No outpatient medications have been marked as taking for the 02/19/17 encounter (Office Visit) with Carole Civil, MD.    BP (!) 199/89   Pulse 67   Ht 5\' 8"  (1.727 m)   Wt 196 lb (88.9 kg)   BMI 29.80 kg/m   Physical Exam  Constitutional: She is oriented to person, place, and time. She appears well-developed and well-nourished.  Neurological: She is alert  and oriented to person, place, and time.  Psychiatric: She has a normal mood and affect. Judgment normal.  Vitals reviewed.   Ortho Exam Abnormal gait flexed posture as a grocery cart sign with her walker  Tenderness in her lower back as well.  No frank weakness in the right or left leg decreased range of motion and swelling and tenderness in both knees with decreased flexion as well as extension.  Flexion contractures are noted at the knee and hip bilaterally.  Both knees are stable.  Muscle tone is normal.  Skin is warm dry and intact in each knee without erythema.  Distally she has skin changes and peripheral edema bilaterally.  No sensory loss is noted.  Reflexes are 1-2+ at the knee and ankle.     Encounter Diagnoses  Name Primary?  . Chronic pain of right knee   . Spinal stenosis, lumbar region, with neurogenic claudication   . Primary osteoarthritis of right knee Yes     PLAN:   Home physical therapy for spinal stenosis 3 times a week for 4 weeks for the spinal stenosis  Right knee injection  Procedure note right knee injection verbal consent was obtained to inject right knee joint  Timeout was completed to confirm the site of injection  The medications used were 40 mg of Depo-Medrol and 1% lidocaine 3 cc  Anesthesia was provided by ethyl chloride and the skin was prepped with alcohol.  After cleaning the skin with alcohol a 20-gauge needle was used to inject the right knee joint. There were no complications. A sterile bandage was applied.    Follow-up in 3 months for x-rays L spine

## 2017-02-20 ENCOUNTER — Telehealth: Payer: Self-pay | Admitting: Radiology

## 2017-02-20 ENCOUNTER — Telehealth: Payer: Self-pay | Admitting: Family Medicine

## 2017-02-20 DIAGNOSIS — M1711 Unilateral primary osteoarthritis, right knee: Secondary | ICD-10-CM | POA: Diagnosis not present

## 2017-02-20 DIAGNOSIS — Z9181 History of falling: Secondary | ICD-10-CM | POA: Diagnosis not present

## 2017-02-20 DIAGNOSIS — R2689 Other abnormalities of gait and mobility: Secondary | ICD-10-CM | POA: Diagnosis not present

## 2017-02-20 DIAGNOSIS — R531 Weakness: Secondary | ICD-10-CM | POA: Diagnosis not present

## 2017-02-20 DIAGNOSIS — E039 Hypothyroidism, unspecified: Secondary | ICD-10-CM | POA: Diagnosis not present

## 2017-02-20 DIAGNOSIS — M48062 Spinal stenosis, lumbar region with neurogenic claudication: Secondary | ICD-10-CM

## 2017-02-20 DIAGNOSIS — M1990 Unspecified osteoarthritis, unspecified site: Secondary | ICD-10-CM | POA: Diagnosis not present

## 2017-02-20 MED ORDER — AMLODIPINE BESYLATE 2.5 MG PO TABS
2.5000 mg | ORAL_TABLET | Freq: Every day | ORAL | 3 refills | Status: DC
Start: 2017-02-20 — End: 2017-07-15

## 2017-02-20 NOTE — Telephone Encounter (Signed)
Yes if she can make it

## 2017-02-20 NOTE — Telephone Encounter (Signed)
You ordered [facility] PT for patient, but she is being d/c today from [facility], [place] [facility] has asked if we can send order for OP physical therapy, ok to send order for OP instead of Home Health?

## 2017-02-20 NOTE — Telephone Encounter (Signed)
[facility] nurse calling to inform us that her blood pressures have been running high and we may want to adjust medications.  Today it was 168/90 and she said the patient told her it was really high when she went to see Dr. Aline Brochure, 199/89.  [facility] is discharging her today.

## 2017-02-20 NOTE — Telephone Encounter (Signed)
Pt is aware and we sent it to [facility] as she asked [place] to.

## 2017-02-20 NOTE — Telephone Encounter (Signed)
Add amlodipine 2.5 mg 1 daily, #30, 3 refills

## 2017-02-20 NOTE — Telephone Encounter (Signed)
[facility] PT has indicated patient agrees to OP PT and is expecting call, I have put in the order, but she seemed to be home bound when she was here.

## 2017-02-27 ENCOUNTER — Telehealth (HOSPITAL_COMMUNITY): Payer: Self-pay | Admitting: Family Medicine

## 2017-02-27 NOTE — Telephone Encounter (Signed)
02/27/17  pt called and wanted to reschedule for next week

## 2017-02-28 ENCOUNTER — Ambulatory Visit (HOSPITAL_COMMUNITY): Payer: Medicare Other | Admitting: Physical Therapy

## 2017-03-04 ENCOUNTER — Encounter (HOSPITAL_COMMUNITY): Payer: Self-pay

## 2017-03-04 ENCOUNTER — Other Ambulatory Visit: Payer: Self-pay

## 2017-03-04 ENCOUNTER — Ambulatory Visit (HOSPITAL_COMMUNITY): Payer: Medicare Other | Attending: Orthopedic Surgery

## 2017-03-04 DIAGNOSIS — M545 Low back pain, unspecified: Secondary | ICD-10-CM

## 2017-03-04 DIAGNOSIS — M25561 Pain in right knee: Secondary | ICD-10-CM | POA: Diagnosis not present

## 2017-03-04 DIAGNOSIS — R262 Difficulty in walking, not elsewhere classified: Secondary | ICD-10-CM | POA: Diagnosis not present

## 2017-03-04 DIAGNOSIS — G8929 Other chronic pain: Secondary | ICD-10-CM | POA: Insufficient documentation

## 2017-03-04 DIAGNOSIS — M6281 Muscle weakness (generalized): Secondary | ICD-10-CM | POA: Insufficient documentation

## 2017-03-04 NOTE — Therapy (Addendum)
[place], [place], [address] Phone: [phone]   Fax:  [phone]  Physical Therapy Evaluation  Patient Details  Name: Cynthia Marks MRN: [id number] Date of Birth: 03-27-1932 Referring Provider: Arther Abbott, MD   Encounter Date: 03/04/2017  PT End of Session - 03/04/17 1644    Visit Number  1    Number of Visits  16    Date for PT Re-Evaluation  04/01/17    Authorization Type  Medicare (secondary: generic commercial)    Authorization Time Period  03/04/17 to 04/29/17    Authorization - Visit Number  1    Authorization - Number of Visits  10    PT Start Time  6644    PT Stop Time  1517    PT Time Calculation (min)  45 min    Activity Tolerance  Patient tolerated treatment well    Behavior During Therapy  [facility] for tasks assessed/performed       Past Medical History:  Diagnosis Date  . Arthritis    Knees  . Diabetes mellitus   . Hypertension   . Macular degeneration   . Thyroid disease     Past Surgical History:  Procedure Laterality Date  . ABDOMINAL HYSTERECTOMY    . COLONOSCOPY    . SHOULDER SURGERY      There were no vitals filed for this visit.   Subjective Assessment - 03/04/17 1446    Subjective  Pt states that she fell 5 years ago and has been having LBP and R knee pain ever since. She states that walking is the most difficult thing for her to do currently. She states that her legs don't go numb or anything when she walks for a long time but they just hurt. She got a cortisone shot 02/19/17 for her R knee and that helped her pain. She has been using a RW for about 4 years, but prior she was using a SPC for a while. She denies any falls in the last 6 months. She denies any n/t in BLE and also denies any b/b changes.     Limitations  Walking    How long can you sit comfortably?  no real issues    How long can you stand comfortably?  getting up from sitting; <5 mins    How long can  you walk comfortably?  <5 mins    Patient Stated Goals  be able to walk again    Currently in Pain?  Yes    Pain Score  5     Pain Location  Back    Pain Orientation  Lower    Pain Descriptors / Indicators  Aching;Dull    Pain Type  Chronic pain    Pain Onset  More than a month ago    Pain Frequency  Intermittent    Aggravating Factors   walking, standing, sitting without support    Pain Relieving Factors  sitting with back support, heating pad, laying in bed    Effect of Pain on Daily Activities  can't do things like she used to         [facility] PT Assessment - 03/04/17 0001      Assessment   Medical Diagnosis  spinal stenosis of lumbar region with neurogenic claudication    Referring Provider  Arther Abbott, MD    Onset Date/Surgical Date  -- 5 years ago    Next MD Visit  -- March 2019  Prior Therapy  yes for R knee      Balance Screen   Has the patient fallen in the past 6 months  No    Has the patient had a decrease in activity level because of a fear of falling?   Yes    Is the patient reluctant to leave their home because of a fear of falling?   Yes      [place] residence    Additional Comments  has difficulty with stairs but uses her ramp at the back entrance and does not use stairs that are in the house      Observation/Other Assessments   Observations  SKTC decreased LLE pain      Sensation   Light Touch  Appears Intact      Posture/Postural Control   Posture/Postural Control  Postural limitations    Postural Limitations  Rounded Shoulders;Forward head;Increased thoracic kyphosis      ROM / Strength   AROM / PROM / Strength  AROM;Strength      AROM   Overall AROM Comments  measure knee AROM next visit    AROM Assessment Site  Lumbar    Lumbar Flexion  50% limited or >; RFIS x10 reps increased RLE pain    Lumbar Extension  50% limited or >; painful    Lumbar - Right Side Bend  just past mid-thigh; non-painful     Lumbar - Left Side Bend  almost to knee; non-painful    Lumbar - Right Rotation  50% limited; non-painful    Lumbar - Left Rotation  50% limited; non-painful      Strength   Overall Strength Comments  hip ext and abd not objetively tested but are significantly weak based off of functional movements, gait, etc.     Right/Left Hip  Right;Left    Right Hip Flexion  4+/5    Left Hip Flexion  4+/5    Right/Left Knee  Right;Left    Right Knee Flexion  4+/5    Right Knee Extension  4/5    Left Knee Flexion  5/5    Left Knee Extension  4/5    Right Ankle Dorsiflexion  4/5    Left Ankle Dorsiflexion  4/5      Ambulation/Gait   Ambulation Distance (Feet)  118 Feet 3MWT    Assistive device  Rolling walker    Gait Pattern  Step-through pattern;Decreased arm swing - right;Decreased arm swing - left;Decreased stride length;Decreased hip/knee flexion - right;Decreased hip/knee flexion - left;Decreased dorsiflexion - right;Decreased dorsiflexion - left;Trendelenburg;Antalgic;Trunk flexed;Wide base of support    Gait Comments  antalgic on R knee; pt's LBP and R knee pain 5/10 at beginning and end of test, no reports of increased leg pain, main c/o R knee pain      Balance   Balance Assessed  -- SLS to be assessed next visit      Standardized Balance Assessment   Standardized Balance Assessment  Five Times Sit to Stand;Timed Up and Go Test    Five times sit to stand comments   44 sec, BUE support      Timed Up and Go Test   TUG Comments  to be assessed next visit          Objective measurements completed on examination: See above findings.       PT Education - 03/04/17 1643    Education provided  Yes    Education Details  exam findings, POC, HEP    Person(s) Educated  Patient    Methods  Explanation;Demonstration;Handout    Comprehension  Verbalized understanding;Returned demonstration       PT Short Term Goals - 03/04/17 1653      PT SHORT TERM GOAL #1   Title  Pt will be  independent with HEP and perform consistently in order to maximize overall function.    Time  4    Period  Weeks    Status  New    Target Date  04/01/17      PT SHORT TERM GOAL #2   Title  Pt will have 1/2 grade improvement OR > in all MMT tested in order to decrease pain, maximize gait, and maximize balance in order to promote function at home and in the community.    Time  4    Period  Weeks    Status  New      PT SHORT TERM GOAL #3   Title  Pt will have improved 3MWT by 180ft or > with LRAD in order to maximize her function at home and in the community.    Time  4    Period  Weeks    Status  New        PT Long Term Goals - 03/04/17 1657      PT LONG TERM GOAL #1   Title  Pt will have at least 50% improvement or > in standing and walking tolerance (at least 10 mins) before needing to rest with 2/10 LBP and BLE pain in order to allow pt to perform household chores with greater ease.     Time  8    Period  Weeks    Status  New    Target Date  04/29/17      PT LONG TERM GOAL #2   Title  Pt will have improved 5xSTS to 25 sec or < with 1 to no UE support to demo improved functional BLE strength and decrease risk for falls.    Time  8    Period  Weeks    Status  New      PT LONG TERM GOAL #3   Title  Pt will be able to perform SLS on BLE for 10 sec or > with 1 to no UE assist in order to maximize gait and decrease risk for falls.     Time  8    Period  Weeks    Status  New      PT LONG TERM GOAL #4   Title  Pt will be able to perform the TUG in 12 sec or < with LRAD in order to demo improved balance, decrease risk for falls, and maximize community access.     Time  8    Period  Weeks    Status  New      PT LONG TERM GOAL #5   Title  Pt will have improved lumbar ROM by 25% or > throughout in order to decrease pain and allow pt to perform ADLs with greater ease.     Time  8    Period  Weeks    Status  New             Plan - 03/04/17 1647    Clinical Impression  Statement  Pt is pleasant 83 YO F who presents to OPPT with c/o chronic LBP and chronic R knee pain s/p fall 5 years ago. She denies any  recent falls but states that walking, standing, sitting without support, and stairs are the most difficult things for her to do. Pt currently presents with deficits in MMT, functional strength, lumbar ROM, knee ROM, gait, balance, endurance, and ability to perform functional tasks due to her pain. Pt appears to have flexion-preference as she responded well to [facility] stating that her RLE pain decreased with it (provided as HEP). Pt did not have any increased LE pain during 3MWT, she just c/o her R knee pain. Pt needs skilled PT intervention to address deficits in order to maximize overall function and QOL. Pt has macular degeneration and cannot read handouts, her husband reads them to her.    History and Personal Factors relevant to plan of care:  no h/o knee, hip, or lumbar surgery; arthritis, DM 2, HTN, thyroid diseas, Macular degeneration    Clinical Presentation  Stable    Clinical Presentation due to:  MMT, functional strength, lumbar ROM, knee ROM, gait, balance, endurance, and ability to perform functional tasks    Clinical Decision Making  Low    Rehab Potential  Fair    PT Frequency  2x / week    PT Duration  8 weeks    PT Treatment/Interventions  ADLs/Self Care Home Management;Cryotherapy;Electrical Stimulation;Moist Heat;Traction;DME Instruction;Gait training;Stair training;Functional mobility training;Therapeutic activities;Therapeutic exercise;Balance training;Patient/family education;Manual techniques;Passive range of motion;Dry needling;Energy conservation;Taping    PT Next Visit Plan  review goals, HEP; perform TUG, SLS; measure knee AROM; Flexion-preference; HS stretching, trial seated lumbar flexion stretch; functional strengthening, balance and gait training    PT Home Exercise Plan  eval: SKTC    Consulted and Agree with Plan of Care  Patient        Patient will benefit from skilled therapeutic intervention in order to improve the following deficits and impairments:  Abnormal gait, Decreased activity tolerance, Decreased balance, Decreased endurance, Decreased mobility, Decreased range of motion, Decreased strength, Difficulty walking, Hypomobility, Increased fascial restricitons, Increased muscle spasms, Impaired flexibility, Improper body mechanics, Postural dysfunction, Obesity, Pain  Visit Diagnosis: Chronic bilateral low back pain without sciatica - Plan: PT plan of care cert/re-cert  Chronic pain of right knee - Plan: PT plan of care cert/re-cert  Muscle weakness (generalized) - Plan: PT plan of care cert/re-cert  Difficulty in walking, not elsewhere classified - Plan: PT plan of care cert/re-cert      G-codes: Mobility: walking and moving around Current: CL Goal: CK    Problem List Patient Active Problem List   Diagnosis Date Noted  . Other hyperparathyroidism ([place]) 11/25/2016  . Hypercalcemia 08/09/2016  . Osteoporosis 10/08/2013  . Hyperlipemia 11/30/2012  . Arthritis of knee, degenerative 09/23/2012  . Knee bursitis 09/23/2012  . Bacteremia 08/13/2012  . Acute pyelonephritis 08/11/2012  . Dehydration 08/11/2012  . Hyponatremia 08/11/2012  . ARF (acute renal failure) ([place]) 08/11/2012  . Uncontrolled type 2 diabetes mellitus with complication, without long-term current use of insulin ([place]) 08/11/2012  . HTN (hypertension) 08/11/2012  . Hypothyroidism 08/11/2012  . Gait abnormality 04/22/2012  . Ankle fracture, bimalleolar, closed 12/10/2011  . Skin breakdown 12/10/2011       Geraldine Solar PT, DPT   [facility] [address] [place], [place], [address] Phone: [phone]   Fax:  [phone]  Name: Cynthia Marks MRN: [id number] Date of Birth: 05/13/1932

## 2017-03-04 NOTE — Patient Instructions (Signed)
  SINGLE KNEE TO CHEST STRETCH - [place]  While Lying on your back, hold your knee and gently pull it up towards your chest.  Use a sheet or blanket behind your knee and kick knee straight to help decrease the pain in your knee with this stretch  Perform 2x/day, 3-5 stretches holding for 30-60 seconds each side.

## 2017-03-05 ENCOUNTER — Ambulatory Visit (HOSPITAL_COMMUNITY): Payer: Medicare Other | Admitting: Physical Therapy

## 2017-03-05 DIAGNOSIS — M6281 Muscle weakness (generalized): Secondary | ICD-10-CM | POA: Diagnosis not present

## 2017-03-05 DIAGNOSIS — R262 Difficulty in walking, not elsewhere classified: Secondary | ICD-10-CM

## 2017-03-05 DIAGNOSIS — M25561 Pain in right knee: Secondary | ICD-10-CM

## 2017-03-05 DIAGNOSIS — G8929 Other chronic pain: Secondary | ICD-10-CM | POA: Diagnosis not present

## 2017-03-05 DIAGNOSIS — M545 Low back pain: Secondary | ICD-10-CM | POA: Diagnosis not present

## 2017-03-05 NOTE — Therapy (Signed)
[place], [place], [address] Phone: [phone]   Fax:  [phone]  Physical Therapy Treatment  Patient Details  Name: Cynthia Marks MRN: [id number] Date of Birth: 01/25/1933 Referring Provider: Arther Abbott, MD   Encounter Date: 03/05/2017  PT End of Session - 03/05/17 1459    Visit Number  2    Number of Visits  16    Date for PT Re-Evaluation  04/01/17    Authorization Type  Medicare (secondary: generic commercial)    Authorization Time Period  03/04/17 to 04/29/17    Authorization - Visit Number  2    Authorization - Number of Visits  10    PT Start Time  1300    PT Stop Time  1345    PT Time Calculation (min)  45 min    Activity Tolerance  Patient tolerated treatment well    Behavior During Therapy  [facility] for tasks assessed/performed       Past Medical History:  Diagnosis Date  . Arthritis    Knees  . Diabetes mellitus   . Hypertension   . Macular degeneration   . Thyroid disease     Past Surgical History:  Procedure Laterality Date  . ABDOMINAL HYSTERECTOMY    . COLONOSCOPY    . SHOULDER SURGERY      There were no vitals filed for this visit.  Subjective Assessment - 03/05/17 1323    Subjective  Pt states she is not hurting bad today in her knees or back.  States pain is no more than 4/10.  PT states she has not doing the stretch given to her yesterday at home.     Pain Score  4     Pain Location  Back    Pain Orientation  Lower    Pain Descriptors / Indicators  Aching;Dull         [facility] PT Assessment - 03/05/17 0001      Assessment   Medical Diagnosis  spinal stenosis of lumbar region with neurogenic claudication      AROM   Right Knee Extension  20    Right Knee Flexion  105    Left Knee Extension  18    Left Knee Flexion  110      Static Standing Balance   Static Standing - Balance Support  No upper extremity supported    Static Standing - Level of Assistance  3:  Mod assist    Static Standing - Comment/# of Minutes  max Lt 3", Rt 1"      Timed Up and Go Test   TUG Comments  1'05" with RW                  [facility] Adult PT Treatment/Exercise - 03/05/17 0001      Knee/Hip Exercises: Stretches   Passive Hamstring Stretch  Both;3 reps;20 seconds    Passive Hamstring Stretch Limitations  supine by therapist    Other Knee/Hip Stretches  glute sets 10X3"holds    Other Knee/Hip Stretches  mid back stretch seated 2X20"             PT Education - 03/05/17 1456    Education provided  Yes    Education Details  educated on goals per initial evaluation and reveiwed SKTC given at [facility].     Person(s) Educated  Patient    Methods  Explanation;Demonstration;Tactile cues;Handout;Verbal cues    Comprehension  Verbalized understanding;Returned demonstration;Verbal cues  required;Tactile cues required;Need further instruction       PT Short Term Goals - 03/04/17 1653      PT SHORT TERM GOAL #1   Title  Pt will be independent with HEP and perform consistently in order to maximize overall function.    Time  4    Period  Weeks    Status  New    Target Date  04/01/17      PT SHORT TERM GOAL #2   Title  Pt will have 1/2 grade improvement OR > in all MMT tested in order to decrease pain, maximize gait, and maximize balance in order to promote function at home and in the community.    Time  4    Period  Weeks    Status  New      PT SHORT TERM GOAL #3   Title  Pt will have improved 3MWT by 126ft or > with LRAD in order to maximize her function at home and in the community.    Time  4    Period  Weeks    Status  New        PT Long Term Goals - 03/04/17 1657      PT LONG TERM GOAL #1   Title  Pt will have at least 50% improvement or > in standing and walking tolerance (at least 10 mins) before needing to rest with 2/10 LBP and BLE pain in order to allow pt to perform household chores with greater ease.     Time  8    Period  Weeks     Status  New    Target Date  04/29/17      PT LONG TERM GOAL #2   Title  Pt will have improved 5xSTS to 25 sec or < with 1 to no UE support to demo improved functional BLE strength and decrease risk for falls.    Time  8    Period  Weeks    Status  New      PT LONG TERM GOAL #3   Title  Pt will be able to perform SLS on BLE for 10 sec or > with 1 to no UE assist in order to maximize gait and decrease risk for falls.     Time  8    Period  Weeks    Status  New      PT LONG TERM GOAL #4   Title  Pt will be able to perform the TUG in 12 sec or < with LRAD in order to demo improved balance, decrease risk for falls, and maximize community access.     Time  8    Period  Weeks    Status  New      PT LONG TERM GOAL #5   Title  Pt will have improved lumbar ROM by 25% or > throughout in order to decrease pain and allow pt to perform ADLs with greater ease.     Time  8    Period  Weeks    Status  New            Plan - 03/05/17 1503    Clinical Impression Statement  PT comes today with equal pain as last session.  Reports she has not done her stretches since her visit yesterday.  States she really has difficulty getting up to stand.  Completed additional testing per PT POC including TUG at 1'05" and knee ROM limited at 18-110 degrees  Lt and 20-105 degrees RT LE.  Pt unable to complete true hamsting stretch without passive ROM assistance.  No further exercises added to HEP this session.      Rehab Potential  Fair    PT Frequency  2x / week    PT Duration  8 weeks    PT Treatment/Interventions  ADLs/Self Care Home Management;Cryotherapy;Electrical Stimulation;Moist Heat;Traction;DME Instruction;Gait training;Stair training;Functional mobility training;Therapeutic activities;Therapeutic exercise;Balance training;Patient/family education;Manual techniques;Passive range of motion;Dry needling;Energy conservation;Taping    PT Next Visit Plan  Continue to progress functional strengthening,  balance and gait training    PT Home Exercise Plan  eval: SKTC    Consulted and Agree with Plan of Care  Patient       Patient will benefit from skilled therapeutic intervention in order to improve the following deficits and impairments:  Abnormal gait, Decreased activity tolerance, Decreased balance, Decreased endurance, Decreased mobility, Decreased range of motion, Decreased strength, Difficulty walking, Hypomobility, Increased fascial restricitons, Increased muscle spasms, Impaired flexibility, Improper body mechanics, Postural dysfunction, Obesity, Pain  Visit Diagnosis: Chronic bilateral low back pain without sciatica  Chronic pain of right knee  Muscle weakness (generalized)  Difficulty in walking, not elsewhere classified     Problem List Patient Active Problem List   Diagnosis Date Noted  . Other hyperparathyroidism ([place]) 11/25/2016  . Hypercalcemia 08/09/2016  . Osteoporosis 10/08/2013  . Hyperlipemia 11/30/2012  . Arthritis of knee, degenerative 09/23/2012  . Knee bursitis 09/23/2012  . Bacteremia 08/13/2012  . Acute pyelonephritis 08/11/2012  . Dehydration 08/11/2012  . Hyponatremia 08/11/2012  . ARF (acute renal failure) ([place]) 08/11/2012  . Uncontrolled type 2 diabetes mellitus with complication, without long-term current use of insulin ([place]) 08/11/2012  . HTN (hypertension) 08/11/2012  . Hypothyroidism 08/11/2012  . Gait abnormality 04/22/2012  . Ankle fracture, bimalleolar, closed 12/10/2011  . Skin breakdown 12/10/2011   Teena Irani, PTA/CLT [phone]  Teena Irani 03/05/2017, 3:25 PM  [facility] [address] [place], [place], [address] Phone: [phone]   Fax:  [phone]  Name: NEKISHA MCDIARMID MRN: [id number] Date of Birth: Feb 03, 1933

## 2017-03-13 ENCOUNTER — Ambulatory Visit (HOSPITAL_COMMUNITY): Payer: Medicare Other | Admitting: Physical Therapy

## 2017-03-13 DIAGNOSIS — R262 Difficulty in walking, not elsewhere classified: Secondary | ICD-10-CM | POA: Diagnosis not present

## 2017-03-13 DIAGNOSIS — M545 Low back pain, unspecified: Secondary | ICD-10-CM

## 2017-03-13 DIAGNOSIS — G8929 Other chronic pain: Secondary | ICD-10-CM

## 2017-03-13 DIAGNOSIS — M6281 Muscle weakness (generalized): Secondary | ICD-10-CM

## 2017-03-13 DIAGNOSIS — M25561 Pain in right knee: Secondary | ICD-10-CM | POA: Diagnosis not present

## 2017-03-13 NOTE — Therapy (Signed)
[facility] [address] [place], [place], [address] Phone: [phone]   Fax:  [phone]  Physical Therapy Treatment  Patient Details  Name: Cynthia Marks MRN: [id number] Date of Birth: 1932/03/28 Referring Provider: Arther Abbott, MD   Encounter Date: 03/13/2017    Past Medical History:  Diagnosis Date  . Arthritis    Knees  . Diabetes mellitus   . Hypertension   . Macular degeneration   . Thyroid disease     Past Surgical History:  Procedure Laterality Date  . ABDOMINAL HYSTERECTOMY    . COLONOSCOPY    . SHOULDER SURGERY      There were no vitals filed for this visit.  Subjective Assessment - 03/13/17 1533    Subjective  Pt states the rain is making her ache in her Rt knee.  3/10 in back and 5/10 in her Rt knee.     Currently in Pain?  Yes    Pain Score  3     Pain Location  Back                      [facility] Adult PT Treatment/Exercise - 03/13/17 0001      Ambulation/Gait   Ambulation Distance (Feet)  200 Feet    Assistive device  Rolling walker    Gait Pattern  Step-through pattern;Decreased arm swing - right;Decreased arm swing - left;Decreased stride length;Decreased hip/knee flexion - right;Decreased hip/knee flexion - left;Decreased dorsiflexion - right;Decreased dorsiflexion - left;Trendelenburg;Antalgic;Trunk flexed;Wide base of support      Knee/Hip Exercises: Stretches   Active Hamstring Stretch  --      Knee/Hip Exercises: Standing   Heel Raises  Both;10 reps;Limitations    Heel Raises Limitations  toeraises 10 reps with AA for form from therapist    SLS  15" each LE with 1 HHA x2 sets    Other Standing Knee Exercises  tandem gait 2X15" each LE lead no UE assist-->intermittent assist      Knee/Hip Exercises: Seated   Sit to Sand  5 reps;without UE support;2 sets from standard chair with airex without armrests               PT Short Term Goals - 03/04/17 1653      PT  SHORT TERM GOAL #1   Title  Pt will be independent with HEP and perform consistently in order to maximize overall function.    Time  4    Period  Weeks    Status  New    Target Date  04/01/17      PT SHORT TERM GOAL #2   Title  Pt will have 1/2 grade improvement OR > in all MMT tested in order to decrease pain, maximize gait, and maximize balance in order to promote function at home and in the community.    Time  4    Period  Weeks    Status  New      PT SHORT TERM GOAL #3   Title  Pt will have improved 3MWT by 164ft or > with LRAD in order to maximize her function at home and in the community.    Time  4    Period  Weeks    Status  New        PT Long Term Goals - 03/04/17 1657      PT LONG TERM GOAL #1   Title  Pt will have at least 50% improvement or >  in standing and walking tolerance (at least 10 mins) before needing to rest with 2/10 LBP and BLE pain in order to allow pt to perform household chores with greater ease.     Time  8    Period  Weeks    Status  New    Target Date  04/29/17      PT LONG TERM GOAL #2   Title  Pt will have improved 5xSTS to 25 sec or < with 1 to no UE support to demo improved functional BLE strength and decrease risk for falls.    Time  8    Period  Weeks    Status  New      PT LONG TERM GOAL #3   Title  Pt will be able to perform SLS on BLE for 10 sec or > with 1 to no UE assist in order to maximize gait and decrease risk for falls.     Time  8    Period  Weeks    Status  New      PT LONG TERM GOAL #4   Title  Pt will be able to perform the TUG in 12 sec or < with LRAD in order to demo improved balance, decrease risk for falls, and maximize community access.     Time  8    Period  Weeks    Status  New      PT LONG TERM GOAL #5   Title  Pt will have improved lumbar ROM by 25% or > throughout in order to decrease pain and allow pt to perform ADLs with greater ease.     Time  8    Period  Weeks    Status  New            Plan -  03/13/17 1617    Clinical Impression Statement  Pt pain remains constant and admits to not being as complaint with her exercises as she should.  Conitnues to have extreme difficulty getting out of chairs and just with general mobility.  Progressed this session adding static balance tasks and working on standing from chair without UE assist (used foam to increase height as too difficult from standard).  Max cues for posture with ambualtion and all exercises.  No increased pain reported during or at end of session.        Rehab Potential  Fair    PT Frequency  2x / week    PT Duration  8 weeks    PT Treatment/Interventions  ADLs/Self Care Home Management;Cryotherapy;Electrical Stimulation;Moist Heat;Traction;DME Instruction;Gait training;Stair training;Functional mobility training;Therapeutic activities;Therapeutic exercise;Balance training;Patient/family education;Manual techniques;Passive range of motion;Dry needling;Energy conservation;Taping    PT Next Visit Plan  Continue to progress functional strengthening, balance and gait training    PT Home Exercise Plan  eval: SKTC    Consulted and Agree with Plan of Care  Patient       Patient will benefit from skilled therapeutic intervention in order to improve the following deficits and impairments:  Abnormal gait, Decreased activity tolerance, Decreased balance, Decreased endurance, Decreased mobility, Decreased range of motion, Decreased strength, Difficulty walking, Hypomobility, Increased fascial restricitons, Increased muscle spasms, Impaired flexibility, Improper body mechanics, Postural dysfunction, Obesity, Pain  Visit Diagnosis: Chronic bilateral low back pain without sciatica  Chronic pain of right knee  Muscle weakness (generalized)  Difficulty in walking, not elsewhere classified     Problem List Patient Active Problem List   Diagnosis Date Noted  . Other hyperparathyroidism (  [place]) 11/25/2016  . Hypercalcemia 08/09/2016  .  Osteoporosis 10/08/2013  . Hyperlipemia 11/30/2012  . Arthritis of knee, degenerative 09/23/2012  . Knee bursitis 09/23/2012  . Bacteremia 08/13/2012  . Acute pyelonephritis 08/11/2012  . Dehydration 08/11/2012  . Hyponatremia 08/11/2012  . ARF (acute renal failure) ([place]) 08/11/2012  . Uncontrolled type 2 diabetes mellitus with complication, without long-term current use of insulin ([place]) 08/11/2012  . HTN (hypertension) 08/11/2012  . Hypothyroidism 08/11/2012  . Gait abnormality 04/22/2012  . Ankle fracture, bimalleolar, closed 12/10/2011  . Skin breakdown 12/10/2011   Teena Irani, PTA/CLT [phone]  Teena Irani 03/13/2017, 4:54 PM  [facility] [address] [place], [place], [address] Phone: [phone]   Fax:  [phone]  Name: Cynthia Marks MRN: [id number] Date of Birth: 18-Jul-1932

## 2017-03-19 ENCOUNTER — Encounter (HOSPITAL_COMMUNITY): Payer: Self-pay

## 2017-03-19 ENCOUNTER — Ambulatory Visit (HOSPITAL_COMMUNITY): Payer: Medicare Other | Attending: Orthopedic Surgery

## 2017-03-19 DIAGNOSIS — M6281 Muscle weakness (generalized): Secondary | ICD-10-CM

## 2017-03-19 DIAGNOSIS — G8929 Other chronic pain: Secondary | ICD-10-CM | POA: Diagnosis not present

## 2017-03-19 DIAGNOSIS — M545 Low back pain: Secondary | ICD-10-CM | POA: Diagnosis not present

## 2017-03-19 DIAGNOSIS — R262 Difficulty in walking, not elsewhere classified: Secondary | ICD-10-CM | POA: Diagnosis not present

## 2017-03-19 DIAGNOSIS — M25561 Pain in right knee: Secondary | ICD-10-CM | POA: Diagnosis not present

## 2017-03-19 NOTE — Therapy (Signed)
[facility] [place], [place], [address] Phone: [phone]   Fax:  [phone]  Physical Therapy Treatment  Patient Details  Name: Cynthia Marks MRN: [id number] Date of Birth: Jul 11, 1932 Referring Provider: Arther Abbott, MD   Encounter Date: 03/19/2017  PT End of Session - 03/19/17 1345    Visit Number  3    Number of Visits  16    Date for PT Re-Evaluation  04/01/17    Authorization Type  Medicare (secondary: generic commercial)    Authorization Time Period  03/04/17 to 04/29/17    Authorization - Visit Number  3    Authorization - Number of Visits  10    PT Start Time  6387    PT Stop Time  1426    PT Time Calculation (min)  41 min    Activity Tolerance  Patient tolerated treatment well    Behavior During Therapy  [facility] for tasks assessed/performed       Past Medical History:  Diagnosis Date  . Arthritis    Knees  . Diabetes mellitus   . Hypertension   . Macular degeneration   . Thyroid disease     Past Surgical History:  Procedure Laterality Date  . ABDOMINAL HYSTERECTOMY    . COLONOSCOPY    . SHOULDER SURGERY      There were no vitals filed for this visit.  Subjective Assessment - 03/19/17 1346    Subjective  Pt states that she had a happy new year. She states that her back isn't too bad right now but her R knee is about a 4/10 currently.    Currently in Pain?  Yes    Pain Score  4     Pain Location  Knee    Pain Orientation  Right    Pain Descriptors / Indicators  Aching;Dull    Pain Type  Chronic pain    Pain Onset  More than a month ago    Pain Frequency  Intermittent    Aggravating Factors   waslking, standing, sitting without support    Pain Relieving Factors  sitting with back support, heating pad, laying in bed    Effect of Pain on Daily Activities  can't do things like she used to           [facility] Adult PT Treatment/Exercise - 03/19/17 0001      Knee/Hip Exercises: Stretches    Passive Hamstring Stretch  Both;3 reps;30 seconds    Passive Hamstring Stretch Limitations  supine by therapist      Knee/Hip Exercises: Standing   Heel Raises  Both;10 reps    Heel Raises Limitations  heel and toe    Forward Step Up  Both;10 reps;Hand Hold: 2;Step Height: 4"      Knee/Hip Exercises: Seated   Sit to Sand  2 sets;5 reps;without UE support from 23" height table      Knee/Hip Exercises: Supine   Quad Sets  Both;10 reps    Quad Sets Limitations  5" holds    Short Arc [employer]  Both;10 reps    Short Arc [employer] Limitations  3" holds    Bridges  Both;10 reps    Other Supine Knee/Hip Exercises  supine clams with RTB x10 reps            PT Education - 03/19/17 1345    Education provided  Yes    Education Details  exercise technique    Person(s)  Educated  Patient    Methods  Explanation;Demonstration;Tactile cues;Verbal cues    Comprehension  Returned demonstration;Verbalized understanding;Need further instruction       PT Short Term Goals - 03/04/17 1653      PT SHORT TERM GOAL #1   Title  Pt will be independent with HEP and perform consistently in order to maximize overall function.    Time  4    Period  Weeks    Status  New    Target Date  04/01/17      PT SHORT TERM GOAL #2   Title  Pt will have 1/2 grade improvement OR > in all MMT tested in order to decrease pain, maximize gait, and maximize balance in order to promote function at home and in the community.    Time  4    Period  Weeks    Status  New      PT SHORT TERM GOAL #3   Title  Pt will have improved 3MWT by 163ft or > with LRAD in order to maximize her function at home and in the community.    Time  4    Period  Weeks    Status  New        PT Long Term Goals - 03/04/17 1657      PT LONG TERM GOAL #1   Title  Pt will have at least 50% improvement or > in standing and walking tolerance (at least 10 mins) before needing to rest with 2/10 LBP and BLE pain in order to allow pt to  perform household chores with greater ease.     Time  8    Period  Weeks    Status  New    Target Date  04/29/17      PT LONG TERM GOAL #2   Title  Pt will have improved 5xSTS to 25 sec or < with 1 to no UE support to demo improved functional BLE strength and decrease risk for falls.    Time  8    Period  Weeks    Status  New      PT LONG TERM GOAL #3   Title  Pt will be able to perform SLS on BLE for 10 sec or > with 1 to no UE assist in order to maximize gait and decrease risk for falls.     Time  8    Period  Weeks    Status  New      PT LONG TERM GOAL #4   Title  Pt will be able to perform the TUG in 12 sec or < with LRAD in order to demo improved balance, decrease risk for falls, and maximize community access.     Time  8    Period  Weeks    Status  New      PT LONG TERM GOAL #5   Title  Pt will have improved lumbar ROM by 25% or > throughout in order to decrease pain and allow pt to perform ADLs with greater ease.     Time  8    Period  Weeks    Status  New            Plan - 03/19/17 1418    Clinical Impression Statement  BLE and functional strengthening focus of today's session. Pt continues to demo increased difficulty with STS from normal height surfaces. Continue to address deficits in BLE and functional strength as well as balance,  gait, and bed mobility.    Rehab Potential  Fair    PT Frequency  2x / week    PT Duration  8 weeks    PT Treatment/Interventions  ADLs/Self Care Home Management;Cryotherapy;Electrical Stimulation;Moist Heat;Traction;DME Instruction;Gait training;Stair training;Functional mobility training;Therapeutic activities;Therapeutic exercise;Balance training;Patient/family education;Manual techniques;Passive range of motion;Dry needling;Energy conservation;Taping    PT Next Visit Plan  Continue to progress functional strengthening, balance and gait training; practice bed mobility; add cone taps and standing hip abd next visit    PT Home  Exercise Plan  eval: SKTC; 1/2: supine clams with RTB    Consulted and Agree with Plan of Care  Patient       Patient will benefit from skilled therapeutic intervention in order to improve the following deficits and impairments:  Abnormal gait, Decreased activity tolerance, Decreased balance, Decreased endurance, Decreased mobility, Decreased range of motion, Decreased strength, Difficulty walking, Hypomobility, Increased fascial restricitons, Increased muscle spasms, Impaired flexibility, Improper body mechanics, Postural dysfunction, Obesity, Pain  Visit Diagnosis: Chronic bilateral low back pain without sciatica  Chronic pain of right knee  Muscle weakness (generalized)  Difficulty in walking, not elsewhere classified     Problem List Patient Active Problem List   Diagnosis Date Noted  . Other hyperparathyroidism ([employer]) 11/25/2016  . Hypercalcemia 08/09/2016  . Osteoporosis 10/08/2013  . Hyperlipemia 11/30/2012  . Arthritis of knee, degenerative 09/23/2012  . Knee bursitis 09/23/2012  . Bacteremia 08/13/2012  . Acute pyelonephritis 08/11/2012  . Dehydration 08/11/2012  . Hyponatremia 08/11/2012  . ARF (acute renal failure) ([place]) 08/11/2012  . Uncontrolled type 2 diabetes mellitus with complication, without long-term current use of insulin ([place]) 08/11/2012  . HTN (hypertension) 08/11/2012  . Hypothyroidism 08/11/2012  . Gait abnormality 04/22/2012  . Ankle fracture, bimalleolar, closed 12/10/2011  . Skin breakdown 12/10/2011       Geraldine Solar PT, DPT  [facility] [address] [place], [place], [address] Phone: [phone]   Fax:  [phone]  Name: DASJA BRASE MRN: [id number] Date of Birth: 08-28-1932

## 2017-03-19 NOTE — Patient Instructions (Signed)
  SUPINE HIP ABDUCTION - ELASTIC BAND CLAMS - CLAMSHELL  Lie down on your back with your knees bent. Place an elastic band around your knees and then draw your knees apart.  Perform 1x/day, 2-3 sets of 10-19 reps with red band

## 2017-03-21 ENCOUNTER — Ambulatory Visit (HOSPITAL_COMMUNITY): Payer: Medicare Other

## 2017-03-26 ENCOUNTER — Encounter (HOSPITAL_COMMUNITY): Payer: Self-pay

## 2017-03-26 ENCOUNTER — Ambulatory Visit (HOSPITAL_COMMUNITY): Payer: Medicare Other

## 2017-03-26 DIAGNOSIS — G8929 Other chronic pain: Secondary | ICD-10-CM | POA: Diagnosis not present

## 2017-03-26 DIAGNOSIS — M545 Low back pain: Secondary | ICD-10-CM | POA: Diagnosis not present

## 2017-03-26 DIAGNOSIS — R262 Difficulty in walking, not elsewhere classified: Secondary | ICD-10-CM | POA: Diagnosis not present

## 2017-03-26 DIAGNOSIS — M6281 Muscle weakness (generalized): Secondary | ICD-10-CM | POA: Diagnosis not present

## 2017-03-26 DIAGNOSIS — M25561 Pain in right knee: Secondary | ICD-10-CM | POA: Diagnosis not present

## 2017-03-26 NOTE — Patient Instructions (Signed)
  BRIDGING  While lying on your back with knees bent, tighten your lower abdominals, squeeze your buttocks and then raise your buttocks off the floor/bed as creating a "Bridge" with your body. Hold and then lower yourself and repeat.  Perform exercise 1x/day 2-3 sets; 10-15 reps each set

## 2017-03-26 NOTE — Therapy (Signed)
[place], [place], [address] Phone: [phone]   Fax:  [phone]  Physical Therapy Treatment  Patient Details  Name: Cynthia Marks MRN: [id number] Date of Birth: 02-16-1933 Referring Provider: Arther Abbott, MD   Encounter Date: 03/26/2017  PT End of Session - 03/26/17 1301    Visit Number  5 corrected visit count    Number of Visits  16    Date for PT Re-Evaluation  04/01/17    Authorization Type  Medicare (secondary: generic commercial)    Authorization Time Period  03/04/17 to 04/29/17    Authorization - Visit Number  5    Authorization - Number of Visits  10    PT Start Time  1300    PT Stop Time  1342    PT Time Calculation (min)  42 min    Activity Tolerance  Patient tolerated treatment well    Behavior During Therapy  [facility] for tasks assessed/performed       Past Medical History:  Diagnosis Date  . Arthritis    Knees  . Diabetes mellitus   . Hypertension   . Macular degeneration   . Thyroid disease     Past Surgical History:  Procedure Laterality Date  . ABDOMINAL HYSTERECTOMY    . COLONOSCOPY    . SHOULDER SURGERY      There were no vitals filed for this visit.  Subjective Assessment - 03/26/17 1307    Currently in Pain?  Yes    Pain Score  5     Pain Location  Knee    Pain Orientation  Right    Pain Descriptors / Indicators  -- "It just hurts." Low back does not hurt today.             [facility] Adult PT Treatment/Exercise - 03/26/17 0001      Knee/Hip Exercises: Stretches   Passive Hamstring Stretch  Both;3 reps;30 seconds    Passive Hamstring Stretch Limitations  supine by therapist      Knee/Hip Exercises: Standing   Hip ADduction  Strengthening;Both;10 reps;1 set with RTB      Knee/Hip Exercises: Seated   Sit to Sand  2 sets;5 reps;with UE support from 22" table height      Knee/Hip Exercises: Supine   Short Arc Quad Sets  Both;10 reps    Short Arc Quad Sets  Limitations  3" holds    Bridges  Both;15 reps    Bridges Limitations  2-3" hold       Balance Exercises - 03/26/17 1323      Balance Exercises: Standing   Tandem Stance  Eyes open overhead flexion w/ 2# weight bar; 10 reps; 2 sets    Other Standing Exercises  Both: cone (3) taps x5 RT          PT Education - 03/26/17 1342    Education provided  Yes    Education Details  exercise technique, updated HEP    Person(s) Educated  Patient    Methods  Explanation;Demonstration    Comprehension  Returned demonstration;Verbalized understanding       PT Short Term Goals - 03/04/17 1653      PT SHORT TERM GOAL #1   Title  Pt will be independent with HEP and perform consistently in order to maximize overall function.    Time  4    Period  Weeks    Status  New    Target Date  04/01/17  PT SHORT TERM GOAL #2   Title  Pt will have 1/2 grade improvement OR > in all MMT tested in order to decrease pain, maximize gait, and maximize balance in order to promote function at home and in the community.    Time  4    Period  Weeks    Status  New      PT SHORT TERM GOAL #3   Title  Pt will have improved 3MWT by 134ft or > with LRAD in order to maximize her function at home and in the community.    Time  4    Period  Weeks    Status  New        PT Long Term Goals - 03/04/17 1657      PT LONG TERM GOAL #1   Title  Pt will have at least 50% improvement or > in standing and walking tolerance (at least 10 mins) before needing to rest with 2/10 LBP and BLE pain in order to allow pt to perform household chores with greater ease.     Time  8    Period  Weeks    Status  New    Target Date  04/29/17      PT LONG TERM GOAL #2   Title  Pt will have improved 5xSTS to 25 sec or < with 1 to no UE support to demo improved functional BLE strength and decrease risk for falls.    Time  8    Period  Weeks    Status  New      PT LONG TERM GOAL #3   Title  Pt will be able to perform SLS on BLE  for 10 sec or > with 1 to no UE assist in order to maximize gait and decrease risk for falls.     Time  8    Period  Weeks    Status  New      PT LONG TERM GOAL #4   Title  Pt will be able to perform the TUG in 12 sec or < with LRAD in order to demo improved balance, decrease risk for falls, and maximize community access.     Time  8    Period  Weeks    Status  New      PT LONG TERM GOAL #5   Title  Pt will have improved lumbar ROM by 25% or > throughout in order to decrease pain and allow pt to perform ADLs with greater ease.     Time  8    Period  Weeks    Status  New            Plan - 03/26/17 1343    Clinical Impression Statement  Continued with focus on gluteal and functional strengthening as well as balance this date. Pt with continued c/o R knee pain. Pt did well with balance activities this date. She also continues to demo slowed, labored movements with transfers and gait. Continue and progress as tolerated. She stated that her knee pain was no worse at [facility].    Rehab Potential  Fair    PT Frequency  2x / week    PT Duration  8 weeks    PT Treatment/Interventions  ADLs/Self Care Home Management;Cryotherapy;Electrical Stimulation;Moist Heat;Traction;DME Instruction;Gait training;Stair training;Functional mobility training;Therapeutic activities;Therapeutic exercise;Balance training;Patient/family education;Manual techniques;Passive range of motion;Dry needling;Energy conservation;Taping    PT Next Visit Plan  Continue to progress functional strengthening, balance and gait training; practice  bed mobility; trial HS stretch in sitting, add heel and toe raises in standing    PT Home Exercise Plan  eval: SKTC; 1/2: supine clams with RTB    Consulted and Agree with Plan of Care  Patient       Patient will benefit from skilled therapeutic intervention in order to improve the following deficits and impairments:  Abnormal gait, Decreased activity tolerance, Decreased balance,  Decreased endurance, Decreased mobility, Decreased range of motion, Decreased strength, Difficulty walking, Hypomobility, Increased fascial restricitons, Increased muscle spasms, Impaired flexibility, Improper body mechanics, Postural dysfunction, Obesity, Pain  Visit Diagnosis: Chronic bilateral low back pain without sciatica  Chronic pain of right knee  Muscle weakness (generalized)  Difficulty in walking, not elsewhere classified     Problem List Patient Active Problem List   Diagnosis Date Noted  . Other hyperparathyroidism ([place]) 11/25/2016  . Hypercalcemia 08/09/2016  . Osteoporosis 10/08/2013  . Hyperlipemia 11/30/2012  . Arthritis of knee, degenerative 09/23/2012  . Knee bursitis 09/23/2012  . Bacteremia 08/13/2012  . Acute pyelonephritis 08/11/2012  . Dehydration 08/11/2012  . Hyponatremia 08/11/2012  . ARF (acute renal failure) ([place]) 08/11/2012  . Uncontrolled type 2 diabetes mellitus with complication, without long-term current use of insulin ([place]) 08/11/2012  . HTN (hypertension) 08/11/2012  . Hypothyroidism 08/11/2012  . Gait abnormality 04/22/2012  . Ankle fracture, bimalleolar, closed 12/10/2011  . Skin breakdown 12/10/2011      Geraldine Solar PT, DPT  [facility] [address] [place], [place], [address] Phone: [phone]   Fax:  [phone]  Name: Cynthia Marks MRN: [id number] Date of Birth: 1932-11-12

## 2017-03-28 ENCOUNTER — Encounter (HOSPITAL_COMMUNITY): Payer: Self-pay

## 2017-03-28 ENCOUNTER — Ambulatory Visit (HOSPITAL_COMMUNITY): Payer: Medicare Other

## 2017-03-28 DIAGNOSIS — M25561 Pain in right knee: Secondary | ICD-10-CM | POA: Diagnosis not present

## 2017-03-28 DIAGNOSIS — R262 Difficulty in walking, not elsewhere classified: Secondary | ICD-10-CM | POA: Diagnosis not present

## 2017-03-28 DIAGNOSIS — M6281 Muscle weakness (generalized): Secondary | ICD-10-CM

## 2017-03-28 DIAGNOSIS — G8929 Other chronic pain: Secondary | ICD-10-CM | POA: Diagnosis not present

## 2017-03-28 DIAGNOSIS — M545 Low back pain: Principal | ICD-10-CM

## 2017-03-28 NOTE — Therapy (Signed)
[place], [place], [address] Phone: [phone]   Fax:  [phone]  Physical Therapy Treatment  Patient Details  Name: Cynthia Marks MRN: [id number] Date of Birth: Jul 08, 1932 Referring Provider: Arther Abbott, MD   Encounter Date: 03/28/2017  PT End of Session - 03/28/17 1303    Visit Number  6 corrected visit count    Number of Visits  16    Date for PT Re-Evaluation  04/01/17    Authorization Type  Medicare (secondary: generic commercial)    Authorization Time Period  03/04/17 to 04/29/17    Authorization - Visit Number  6    Authorization - Number of Visits  10    PT Start Time  1300    PT Stop Time  1340    PT Time Calculation (min)  40 min    Activity Tolerance  Patient tolerated treatment well    Behavior During Therapy  [facility] for tasks assessed/performed       Past Medical History:  Diagnosis Date  . Arthritis    Knees  . Diabetes mellitus   . Hypertension   . Macular degeneration   . Thyroid disease     Past Surgical History:  Procedure Laterality Date  . ABDOMINAL HYSTERECTOMY    . COLONOSCOPY    . SHOULDER SURGERY      There were no vitals filed for this visit.  Subjective Assessment - 03/28/17 1302    Subjective  Pt states that she is feeling better. She is not in any pain, her legs are just sore from the exercises.     Currently in Pain?  No/denies    Pain Onset  More than a month ago          [facility] Adult PT Treatment/Exercise - 03/28/17 0001      Knee/Hip Exercises: Stretches   Passive Hamstring Stretch  Both;3 reps;30 seconds    Passive Hamstring Stretch Limitations  seated      Knee/Hip Exercises: Standing   Heel Raises  Both;10 reps    Heel Raises Limitations  heel and toe      Knee/Hip Exercises: Seated   Long Arc Quad  Both;2 sets;10 reps    Long Arc Quad Limitations  3" holds    Ball Squeeze  2 sets of 10x3" holds    Knee/Hip Flexion  marching 2x10    Sit to [employer]  2 sets;5 reps;without UE support from 21" table height      Knee/Hip Exercises: Supine   Short Arc [employer]  Both;15 reps    Short Arc Quad Sets Limitations  3" holds           PT Education - 03/28/17 1303    Education provided  Yes    Education Details  exercise technique    Person(s) Educated  Patient    Methods  Explanation;Demonstration    Comprehension  Verbalized understanding;Returned demonstration       PT Short Term Goals - 03/04/17 1653      PT SHORT TERM GOAL #1   Title  Pt will be independent with HEP and perform consistently in order to maximize overall function.    Time  4    Period  Weeks    Status  New    Target Date  04/01/17      PT SHORT TERM GOAL #2   Title  Pt will have 1/2 grade improvement OR > in all MMT tested in  order to decrease pain, maximize gait, and maximize balance in order to promote function at home and in the community.    Time  4    Period  Weeks    Status  New      PT SHORT TERM GOAL #3   Title  Pt will have improved 3MWT by 120ft or > with LRAD in order to maximize her function at home and in the community.    Time  4    Period  Weeks    Status  New        PT Long Term Goals - 03/04/17 1657      PT LONG TERM GOAL #1   Title  Pt will have at least 50% improvement or > in standing and walking tolerance (at least 10 mins) before needing to rest with 2/10 LBP and BLE pain in order to allow pt to perform household chores with greater ease.     Time  8    Period  Weeks    Status  New    Target Date  04/29/17      PT LONG TERM GOAL #2   Title  Pt will have improved 5xSTS to 25 sec or < with 1 to no UE support to demo improved functional BLE strength and decrease risk for falls.    Time  8    Period  Weeks    Status  New      PT LONG TERM GOAL #3   Title  Pt will be able to perform SLS on BLE for 10 sec or > with 1 to no UE assist in order to maximize gait and decrease risk for falls.     Time  8    Period   Weeks    Status  New      PT LONG TERM GOAL #4   Title  Pt will be able to perform the TUG in 12 sec or < with LRAD in order to demo improved balance, decrease risk for falls, and maximize community access.     Time  8    Period  Weeks    Status  New      PT LONG TERM GOAL #5   Title  Pt will have improved lumbar ROM by 25% or > throughout in order to decrease pain and allow pt to perform ADLs with greater ease.     Time  8    Period  Weeks    Status  New            Plan - 03/28/17 1341    Clinical Impression Statement  General strengthening focus of today's session. Provided pt with seated hip and knee strengthening exercises and provided as HEP. Pt continues to be challenged with sit to stands from lower surface heights. However, she is tolerating steadily lowering the surface height as she could perform from 21" high compared to 22" last visit and 23" on 03/19/17. Did not perform balance activities this date due to not having enough time as pt took increased amounts of time to complete strengthening exercises and required 2 seated rest breaks. Pt due for reassessment.    Rehab Potential  Fair    PT Frequency  2x / week    PT Duration  8 weeks    PT Treatment/Interventions  ADLs/Self Care Home Management;Cryotherapy;Electrical Stimulation;Moist Heat;Traction;DME Instruction;Gait training;Stair training;Functional mobility training;Therapeutic activities;Therapeutic exercise;Balance training;Patient/family education;Manual techniques;Passive range of motion;Dry needling;Energy conservation;Taping    PT Next Visit  Plan  Reassess; Continue to progress functional strengthening, balance and gait training; practice bed mobility; trial HS stretch in sitting, add heel and toe raises in standing    PT Home Exercise Plan  eval: SKTC; 1/2: supine clams with RTB; 1/11: seated march, LAQ, ball squeezes    Consulted and Agree with Plan of Care  Patient       Patient will benefit from skilled  therapeutic intervention in order to improve the following deficits and impairments:  Abnormal gait, Decreased activity tolerance, Decreased balance, Decreased endurance, Decreased mobility, Decreased range of motion, Decreased strength, Difficulty walking, Hypomobility, Increased fascial restricitons, Increased muscle spasms, Impaired flexibility, Improper body mechanics, Postural dysfunction, Obesity, Pain  Visit Diagnosis: Chronic bilateral low back pain without sciatica  Chronic pain of right knee  Muscle weakness (generalized)  Difficulty in walking, not elsewhere classified     Problem List Patient Active Problem List   Diagnosis Date Noted  . Other hyperparathyroidism ([place]) 11/25/2016  . Hypercalcemia 08/09/2016  . Osteoporosis 10/08/2013  . Hyperlipemia 11/30/2012  . Arthritis of knee, degenerative 09/23/2012  . Knee bursitis 09/23/2012  . Bacteremia 08/13/2012  . Acute pyelonephritis 08/11/2012  . Dehydration 08/11/2012  . Hyponatremia 08/11/2012  . ARF (acute renal failure) ([place]) 08/11/2012  . Uncontrolled type 2 diabetes mellitus with complication, without long-term current use of insulin ([place]) 08/11/2012  . HTN (hypertension) 08/11/2012  . Hypothyroidism 08/11/2012  . Gait abnormality 04/22/2012  . Ankle fracture, bimalleolar, closed 12/10/2011  . Skin breakdown 12/10/2011      Geraldine Solar PT, DPT  [facility] [address] [place], [place], [address] Phone: [phone]   Fax:  [phone]  Name: KARINNA BEADLES MRN: [id number] Date of Birth: May 22, 1932

## 2017-03-28 NOTE — Patient Instructions (Signed)
  SEATED MARCHING  While seated in a chair, lift up your foot and knee, set it down and then perform on the other leg. Repeat this alternating movement.   Perform 1x/day, 2-3 sets of 10-15 reps   LONG ARC QUAD - LAQ - HIGH SEAT  While seated with your knee in a bent position, slowly straighten your knee as you raise your foot upwards as shown.   Perform 1x/day, 2-3 sets of 10-15 reps   BALL SQUEEZE - SEATED  While sitting, place a ball between your knees. Squeeze the ball with your knees and hold.   Perform 1x/day, 2-3 sets of 10-15 reps, holding for 5-10 seconds each

## 2017-04-02 ENCOUNTER — Encounter (HOSPITAL_COMMUNITY): Payer: Self-pay

## 2017-04-02 ENCOUNTER — Ambulatory Visit (HOSPITAL_COMMUNITY): Payer: Medicare Other

## 2017-04-02 DIAGNOSIS — M6281 Muscle weakness (generalized): Secondary | ICD-10-CM

## 2017-04-02 DIAGNOSIS — R262 Difficulty in walking, not elsewhere classified: Secondary | ICD-10-CM | POA: Diagnosis not present

## 2017-04-02 DIAGNOSIS — M545 Low back pain: Secondary | ICD-10-CM | POA: Diagnosis not present

## 2017-04-02 DIAGNOSIS — G8929 Other chronic pain: Secondary | ICD-10-CM

## 2017-04-02 DIAGNOSIS — M25561 Pain in right knee: Secondary | ICD-10-CM

## 2017-04-02 NOTE — Therapy (Signed)
[place], [place], [address] Phone: [phone]   Fax:  [phone]  Physical Therapy Treatment/Reassessment  Patient Details  Name: Cynthia Marks MRN: [id number] Date of Birth: Jun 21, 1932 Referring Provider: Arther Abbott, MD   Encounter Date: 04/02/2017  PT End of Session - 04/02/17 1304    Visit Number  7    Number of Visits  16    Date for PT Re-Evaluation  04/29/17    Authorization Type  Medicare (secondary: generic commercial)    Authorization Time Period  03/04/17 to 04/29/17    Authorization - Visit Number  7    Authorization - Number of Visits  16    PT Start Time  1300    PT Stop Time  7096    PT Time Calculation (min)  47 min    Activity Tolerance  Patient tolerated treatment well    Behavior During Therapy  [facility] for tasks assessed/performed       Past Medical History:  Diagnosis Date  . Arthritis    Knees  . Diabetes mellitus   . Hypertension   . Macular degeneration   . Thyroid disease     Past Surgical History:  Procedure Laterality Date  . ABDOMINAL HYSTERECTOMY    . COLONOSCOPY    . SHOULDER SURGERY      There were no vitals filed for this visit.  Subjective Assessment - 04/02/17 1304    Subjective  Pt states that she is feeling pretty good today. The sun makes a big difference. She feels like she is getting a little better.    Currently in Pain?  No/denies    Pain Onset  More than a month ago         [facility] PT Assessment - 04/02/17 0001      ROM / Strength   AROM / PROM / Strength  Strength      AROM   Lumbar Flexion  50% limited or > was 50% limtied or >    Lumbar Extension  50% limited or > was 50% limtied or >    Lumbar - Right Side Bend  just past mid-thigh; non-painful was just past mid thigh    Lumbar - Left Side Bend  almost to knee; non-painful was almost to knee    Lumbar - Right Rotation  50% limited; non-painful was 50% limited    Lumbar - Left  Rotation  50% limited; non-painful was 50% limited      Strength   Strength Assessment Site  Hip    Right Hip Flexion  4+/5 was 4+    Left Hip Flexion  4+/5 was 4+    Right Knee Flexion  4+/5 was 4+    Right Knee Extension  4+/5 was 4    Left Knee Flexion  4+/5    Left Knee Extension  4+/5 was 4    Right Ankle Dorsiflexion  4/5 was 4    Left Ankle Dorsiflexion  4+/5 was 4      Ambulation/Gait   Ambulation Distance (Feet)  166 Feet    Assistive device  Rolling walker    Gait Pattern  Step-through pattern;Decreased arm swing - right;Decreased arm swing - left;Decreased stride length;Decreased hip/knee flexion - right;Decreased hip/knee flexion - left;Decreased dorsiflexion - right;Decreased dorsiflexion - left;Trendelenburg;Antalgic;Trunk flexed;Wide base of support    Gait Comments  no increases in LBP      Balance   Balance Assessed  Yes  Static Standing Balance   Static Standing Balance -  Activities   Single Leg Stance - Right Leg;Single Leg Stance - Left Leg    Static Standing - Comment/# of Minutes  R: 10 sec with 1 UE support L: 9 sec with 1 UE support      Standardized Balance Assessment   Standardized Balance Assessment  Five Times Sit to Stand;Timed Up and Go Test    Five times sit to stand comments   1:06 first trial (multiple attempts at start of test with no UE) 1 UE on chair, 1 UE on thigh; 38.7sec 2nd trial with 1 UE on chair and 1 UE on thigh was 44 sec, BUE support      Timed Up and Go Test   TUG  Normal TUG    TUG Comments  1:06 wtih RW was 1:05 with RW           [facility] Adult PT Treatment/Exercise - 04/02/17 0001      Knee/Hip Exercises: Aerobic   Nustep  x5 mins maintaining SPM >50, to improve gait speed and sequencing           PT Short Term Goals - 04/02/17 1305      PT SHORT TERM GOAL #1   Title  Pt will be independent with HEP and perform consistently in order to maximize overall function.    Time  4    Period  Weeks    Status  Achieved       PT SHORT TERM GOAL #2   Title  Pt will have 1/2 grade improvement OR > in all MMT tested in order to decrease pain, maximize gait, and maximize balance in order to promote function at home and in the community.    Time  4    Period  Weeks    Status  Partially Met      PT SHORT TERM GOAL #3   Title  Pt will have improved 3MWT by 175f or > with LRAD in order to maximize her function at home and in the community.    Baseline  1/16: amb 1667fwith RW (was 20028fn eval) but no increases in LBP this date    Time  4    Period  Weeks    Status  On-going        PT Long Term Goals - 04/02/17 1306      PT LONG TERM GOAL #1   Title  Pt will have at least 50% improvement or > in standing and walking tolerance (at least 10 mins) before needing to rest with 2/10 LBP and BLE pain in order to allow pt to perform household chores with greater ease.     Time  8    Period  Weeks    Status  On-going      PT LONG TERM GOAL #2   Title  Pt will have improved 5xSTS to 25 sec or < with 1 to no UE support to demo improved functional BLE strength and decrease risk for falls.    Baseline  1/16: 38.7 sec, was 44sec    Time  8    Period  Weeks    Status  On-going      PT LONG TERM GOAL #3   Title  Pt will be able to perform SLS on BLE for 10 sec or > with 1 to no UE assist in order to maximize gait and decrease risk for falls.  Baseline  1/16: 10 sec on RLE with 1 UE support, 9 sec on L with 1 UE support    Time  8    Period  Weeks    Status  Partially Met      PT LONG TERM GOAL #4   Title  Pt will be able to perform the TUG in 30 sec or < with LRAD in order to demo improved balance, decrease risk for falls, and maximize community access.     Baseline  1/16: 1:06 with RW (revised goal to say 30 sec or < compared to 12 sec or <)    Time  8    Period  Weeks    Status  Revised      PT LONG TERM GOAL #5   Title  Pt will have improved lumbar ROM by 25% or > throughout in order to decrease pain  and allow pt to perform ADLs with greater ease.     Time  8    Period  Weeks    Status  On-going            Plan - 04/02/17 1351    Clinical Impression Statement  PT reassessed pt's goals and outcome measures this date. Pt has made progress towards her goals as illustrated above. Pt's functional strength has improved AEB 5xSTS times and her ability to maintain SLS to 10 sec or < with 1 UE support. Her MMT has improved some as well, however, her proximal hip strength continues to be significantly impaired. Pt needs continued skilled PT intervention to address remaining deficits in order to maximize strength, balance, and gait, in order to decrease risk for falls. Ended session on NuStep focusing on maintaining SPMs above 50 in order to promote faster gait speed overall.     Rehab Potential  Fair    PT Frequency  2x / week    PT Duration  8 weeks    PT Treatment/Interventions  ADLs/Self Care Home Management;Cryotherapy;Electrical Stimulation;Moist Heat;Traction;DME Instruction;Gait training;Stair training;Functional mobility training;Therapeutic activities;Therapeutic exercise;Balance training;Patient/family education;Manual techniques;Passive range of motion;Dry needling;Energy conservation;Taping    PT Next Visit Plan  Continue Nustep focusing on speed; Continue to progress functional strengthening, balance and gait training; practice bed mobility; trial HS stretch in sitting, add heel and toe raises in standing    PT Home Exercise Plan  eval: SKTC; 1/2: supine clams with RTB; 1/11: seated march, LAQ, ball squeezes    Consulted and Agree with Plan of Care  Patient       Patient will benefit from skilled therapeutic intervention in order to improve the following deficits and impairments:  Abnormal gait, Decreased activity tolerance, Decreased balance, Decreased endurance, Decreased mobility, Decreased range of motion, Decreased strength, Difficulty walking, Hypomobility, Increased fascial  restricitons, Increased muscle spasms, Impaired flexibility, Improper body mechanics, Postural dysfunction, Obesity, Pain  Visit Diagnosis: Chronic bilateral low back pain without sciatica  Chronic pain of right knee  Muscle weakness (generalized)  Difficulty in walking, not elsewhere classified     Problem List Patient Active Problem List   Diagnosis Date Noted  . Other hyperparathyroidism ([place]) 11/25/2016  . Hypercalcemia 08/09/2016  . Osteoporosis 10/08/2013  . Hyperlipemia 11/30/2012  . Arthritis of knee, degenerative 09/23/2012  . Knee bursitis 09/23/2012  . Bacteremia 08/13/2012  . Acute pyelonephritis 08/11/2012  . Dehydration 08/11/2012  . Hyponatremia 08/11/2012  . ARF (acute renal failure) ([place]) 08/11/2012  . Uncontrolled type 2 diabetes mellitus with complication, without long-term current use of insulin ([place])  08/11/2012  . HTN (hypertension) 08/11/2012  . Hypothyroidism 08/11/2012  . Gait abnormality 04/22/2012  . Ankle fracture, bimalleolar, closed 12/10/2011  . Skin breakdown 12/10/2011       Geraldine Solar PT, DPT  [facility] [address] [place], [place], [address] Phone: [phone]   Fax:  [phone]  Name: Cynthia Marks MRN: [id number] Date of Birth: 1932-09-04

## 2017-04-04 ENCOUNTER — Encounter (HOSPITAL_COMMUNITY): Payer: Self-pay

## 2017-04-04 ENCOUNTER — Ambulatory Visit (HOSPITAL_COMMUNITY): Payer: Medicare Other

## 2017-04-04 DIAGNOSIS — M25561 Pain in right knee: Secondary | ICD-10-CM

## 2017-04-04 DIAGNOSIS — G8929 Other chronic pain: Secondary | ICD-10-CM | POA: Diagnosis not present

## 2017-04-04 DIAGNOSIS — M6281 Muscle weakness (generalized): Secondary | ICD-10-CM

## 2017-04-04 DIAGNOSIS — R262 Difficulty in walking, not elsewhere classified: Secondary | ICD-10-CM | POA: Diagnosis not present

## 2017-04-04 DIAGNOSIS — M545 Low back pain: Principal | ICD-10-CM

## 2017-04-04 NOTE — Therapy (Addendum)
[place], [place], [address] Phone: [phone]   Fax:  [phone]  Physical Therapy Treatment  Patient Details  Name: Cynthia Marks MRN: [id number] Date of Birth: 06/01/1932 Referring Provider: Arther Abbott, MD   Encounter Date: 04/04/2017  PT End of Session - 04/11/17 1551    Visit Number  8   Number of Visits  16    Date for PT Re-Evaluation  04/29/17    Authorization Type  Medicare (secondary: generic commercial)    Authorization Time Period  03/04/17 to 04/29/17    Authorization - Visit Number  8    Authorization - Number of Visits  16    PT Start Time  7628    PT Stop Time  1344    PT Time Calculation (min)  41 min    Equipment Utilized During Treatment  Gait belt    Activity Tolerance  Patient tolerated treatment well    Behavior During Therapy  [name] for tasks assessed/performed       Past Medical History:  Diagnosis Date  . Arthritis    Knees  . Diabetes mellitus   . Hypertension   . Macular degeneration   . Thyroid disease     Past Surgical History:  Procedure Laterality Date  . ABDOMINAL HYSTERECTOMY    . COLONOSCOPY    . SHOULDER SURGERY      There were no vitals filed for this visit.  Subjective Assessment - 04/11/17 1308    Subjective  Patient stated she is not having too much pain today. But she is having pain in her right ankle and right knee.     Limitations  Walking    How long can you sit comfortably?  no real issues    How long can you stand comfortably?  getting up from sitting; <5 mins    How long can you walk comfortably?  <5 mins    Patient Stated Goals  be able to walk again    Currently in Pain?  Yes    Pain Score  4     Pain Location  Knee    Pain Orientation  Right    Pain Descriptors / Indicators  Sore    Pain Type  Chronic pain    Pain Onset  More than a month ago    Pain Frequency  Intermittent    Multiple Pain Sites  Yes    Pain Score  4    Pain  Location  Ankle    Pain Orientation  Right    Pain Descriptors / Indicators  Sore    Pain Type  Chronic pain                    Nustep x 5- no charge  [facility] Adult PT Treatment/Exercise - 04/11/17 0001      Knee/Hip Exercises: Stretches   Active Hamstring Stretch  --    Passive Hamstring Stretch  Both;3 reps;30 seconds      Knee/Hip Exercises: Standing   Heel Raises  Both;10 reps    Heel Raises Limitations  heel and toe      Knee/Hip Exercises: Seated   Long Arc Quad  Both;2 sets;10 reps    Long Arc Quad Limitations  3" holds    [employer] Squeeze  2 sets of 10x3" holds    Knee/Hip Flexion  marching 2x10    Sit to [employer]  2 sets;5 reps;without UE support From 22 inch  mat table      Knee/Hip Exercises: Supine   Short Arc Quad Sets  Both;15 reps    Short Arc Quad Sets Limitations  3 second holds    Bridges  2 sets;10 reps;Limitations    Bridges Limitations  2 second hold; partial range       A: Began session with Nustep to improve sequence with UE/LE with gait and activity tolerance, able to keep average 58 SPMs.  Session focus on proximal strengthening to assist with functional activities.  Pt continues to require elevated height with STS, though was able to complete without HHA at 22in.  Cueing for posture through session to assist with balance and reduce knee pain.  No reports of increased pain, was limited by fatigue with activities.      P: Continue Nustep focusing on speed; Continue to progress functional strengthening, balance and gait training; practice bed mobility; trial HS stretch in sitting, add heel and toe raises in standing.  Trial with forward lunges onto step next session.    PT Education - 04/11/17 1550    Education provided  Yes    Education Details  Patient was educated on exercise technique and on the purpose of interventions throughout.     Person(s) Educated  Patient    Methods  Explanation;Demonstration    Comprehension  Verbalized  understanding;Returned demonstration       PT Short Term Goals - 04/02/17 1305      PT SHORT TERM GOAL #1   Title  Pt will be independent with HEP and perform consistently in order to maximize overall function.    Time  4    Period  Weeks    Status  Achieved      PT SHORT TERM GOAL #2   Title  Pt will have 1/2 grade improvement OR > in all MMT tested in order to decrease pain, maximize gait, and maximize balance in order to promote function at home and in the community.    Time  4    Period  Weeks    Status  Partially Met      PT SHORT TERM GOAL #3   Title  Pt will have improved 3MWT by 147f or > with LRAD in order to maximize her function at home and in the community.    Baseline  1/16: amb 1630fwith RW (was 200101fn eval) but no increases in LBP this date    Time  4    Period  Weeks    Status  On-going        PT Long Term Goals - 04/02/17 1306      PT LONG TERM GOAL #1   Title  Pt will have at least 50% improvement or > in standing and walking tolerance (at least 10 mins) before needing to rest with 2/10 LBP and BLE pain in order to allow pt to perform household chores with greater ease.     Time  8    Period  Weeks    Status  On-going      PT LONG TERM GOAL #2   Title  Pt will have improved 5xSTS to 25 sec or < with 1 to no UE support to demo improved functional BLE strength and decrease risk for falls.    Baseline  1/16: 38.7 sec, was 44sec    Time  8    Period  Weeks    Status  On-going      PT LONG TERM GOAL #  3   Title  Pt will be able to perform SLS on BLE for 10 sec or > with 1 to no UE assist in order to maximize gait and decrease risk for falls.     Baseline  1/16: 10 sec on RLE with 1 UE support, 9 sec on L with 1 UE support    Time  8    Period  Weeks    Status  Partially Met      PT LONG TERM GOAL #4   Title  Pt will be able to perform the TUG in 30 sec or < with LRAD in order to demo improved balance, decrease risk for falls, and maximize community  access.     Baseline  1/16: 1:06 with RW (revised goal to say 30 sec or < compared to 12 sec or <)    Time  8    Period  Weeks    Status  Revised      PT LONG TERM GOAL #5   Title  Pt will have improved lumbar ROM by 25% or > throughout in order to decrease pain and allow pt to perform ADLs with greater ease.     Time  8    Period  Weeks    Status  On-going              Patient will benefit from skilled therapeutic intervention in order to improve the following deficits and impairments:  Abnormal gait, Decreased activity tolerance, Decreased balance, Decreased endurance, Decreased mobility, Decreased range of motion, Decreased strength, Difficulty walking, Hypomobility, Increased fascial restricitons, Increased muscle spasms, Impaired flexibility, Improper body mechanics, Postural dysfunction, Obesity, Pain  Visit Diagnosis: Chronic bilateral low back pain without sciatica  Chronic pain of right knee  Muscle weakness (generalized)  Difficulty in walking, not elsewhere classified     Problem List Patient Active Problem List   Diagnosis Date Noted  . Other hyperparathyroidism ([place]) 11/25/2016  . Hypercalcemia 08/09/2016  . Osteoporosis 10/08/2013  . Hyperlipemia 11/30/2012  . Arthritis of knee, degenerative 09/23/2012  . Knee bursitis 09/23/2012  . Bacteremia 08/13/2012  . Acute pyelonephritis 08/11/2012  . Dehydration 08/11/2012  . Hyponatremia 08/11/2012  . ARF (acute renal failure) ([place]) 08/11/2012  . Uncontrolled type 2 diabetes mellitus with complication, without long-term current use of insulin ([place]) 08/11/2012  . HTN (hypertension) 08/11/2012  . Hypothyroidism 08/11/2012  . Gait abnormality 04/22/2012  . Ankle fracture, bimalleolar, closed 12/10/2011  . Skin breakdown 12/10/2011   Ihor Austin, LPTA; CBIS [phone]  Aldona Lento 04/11/2017, 4:00 PM  [place] [address] [place], [place],  [address] Phone: [phone]   Fax:  [phone]  Name: GAVYN ZOSS MRN: [id number] Date of Birth: Dec 10, 1932

## 2017-04-09 ENCOUNTER — Ambulatory Visit (HOSPITAL_COMMUNITY): Payer: Medicare Other | Admitting: Physical Therapy

## 2017-04-09 ENCOUNTER — Telehealth (HOSPITAL_COMMUNITY): Payer: Self-pay | Admitting: Family Medicine

## 2017-04-09 NOTE — Telephone Encounter (Signed)
04/09/17  pt called to say she wouldn't be here today - no reason was given

## 2017-04-11 ENCOUNTER — Ambulatory Visit (HOSPITAL_COMMUNITY): Payer: Medicare Other | Admitting: Physical Therapy

## 2017-04-11 ENCOUNTER — Other Ambulatory Visit: Payer: Self-pay

## 2017-04-11 ENCOUNTER — Encounter (HOSPITAL_COMMUNITY): Payer: Self-pay | Admitting: Physical Therapy

## 2017-04-11 DIAGNOSIS — M25561 Pain in right knee: Secondary | ICD-10-CM

## 2017-04-11 DIAGNOSIS — M6281 Muscle weakness (generalized): Secondary | ICD-10-CM

## 2017-04-11 DIAGNOSIS — G8929 Other chronic pain: Secondary | ICD-10-CM | POA: Diagnosis not present

## 2017-04-11 DIAGNOSIS — E1342 Other specified diabetes mellitus with diabetic polyneuropathy: Secondary | ICD-10-CM | POA: Diagnosis not present

## 2017-04-11 DIAGNOSIS — M545 Low back pain: Principal | ICD-10-CM

## 2017-04-11 DIAGNOSIS — R262 Difficulty in walking, not elsewhere classified: Secondary | ICD-10-CM | POA: Diagnosis not present

## 2017-04-11 DIAGNOSIS — B351 Tinea unguium: Secondary | ICD-10-CM | POA: Diagnosis not present

## 2017-04-11 NOTE — Therapy (Addendum)
[place], [place], [address] Phone: [phone]   Fax:  [phone]  Physical Therapy Treatment  Patient Details  Name: Cynthia Marks MRN: [id number] Date of Birth: [birth year] Referring Provider: Arther Abbott, MD   Encounter Date: 04/11/2017  PT End of Session - 04/11/17 1551    Visit Number  9 Corrected visit number    Number of Visits  16    Date for PT Re-Evaluation  04/29/17    Authorization Type  Medicare (secondary: generic commercial)    Authorization Time Period  03/04/17 to 04/29/17    Authorization - Visit Number  9 Corrected visit count    Authorization - Number of Visits  16    PT Start Time  1303    PT Stop Time  1344    PT Time Calculation (min)  41 min    Equipment Utilized During Treatment  Gait belt    Activity Tolerance  Patient tolerated treatment well    Behavior During Therapy  [facility] for tasks assessed/performed       Past Medical History:  Diagnosis Date  . Arthritis    Knees  . Diabetes mellitus   . Hypertension   . Macular degeneration   . Thyroid disease     Past Surgical History:  Procedure Laterality Date  . ABDOMINAL HYSTERECTOMY    . COLONOSCOPY    . SHOULDER SURGERY      There were no vitals filed for this visit.  Subjective Assessment - 04/11/17 1308    Subjective  Patient stated she is not having too much pain today. But she is having pain in her right ankle and right knee.     Limitations  Walking    How long can you sit comfortably?  no real issues    How long can you stand comfortably?  getting up from sitting; <5 mins    How long can you walk comfortably?  <5 mins    Patient Stated Goals  be able to walk again    Currently in Pain?  Yes    Pain Score  4     Pain Location  Knee    Pain Orientation  Right    Pain Descriptors / Indicators  Sore    Pain Type  Chronic pain    Pain Onset  More than a month ago    Pain Frequency  Intermittent    Multiple Pain Sites  Yes    Pain Score  4    Pain Location  Ankle    Pain Orientation  Right    Pain Descriptors / Indicators  Sore    Pain Type  Chronic pain                      [facility] Adult PT Treatment/Exercise - 04/11/17 0001      Knee/Hip Exercises: Stretches   Active Hamstring Stretch  --    Passive Hamstring Stretch  Both;3 reps;30 seconds      Knee/Hip Exercises: Standing   Heel Raises  Both;10 reps    Heel Raises Limitations  heel and toe    Other Standing Knee Exercises  Tandem stance x 5 attempts each side without upper extremity assist  Cueing to bring feet more in line to challenge balance      Knee/Hip Exercises: Seated   Long Arc Quad  Both;2 sets;10 reps    Long [employer] Limitations  3" holds  Ball Squeeze  2 sets of 10x3" holds    Knee/Hip Flexion  marching 2x10    Sit to [place]  2 sets;5 reps;without UE support From 22 inch mat table      Knee/Hip Exercises: Supine   Short Arc [employer]  Both;15 reps    Short Arc Quad Sets Limitations  3 second holds    Bridges  2 sets;10 reps;Limitations    Bridges Limitations  2 second hold; partial range             PT Education - 04/11/17 1550    Education provided  Yes    Education Details  Patient was educated on exercise technique and on the purpose of interventions throughout.     Person(s) Educated  Patient    Methods  Explanation;Demonstration    Comprehension  Verbalized understanding;Returned demonstration       PT Short Term Goals - 04/02/17 1305      PT SHORT TERM GOAL #1   Title  Pt will be independent with HEP and perform consistently in order to maximize overall function.    Time  4    Period  Weeks    Status  Achieved      PT SHORT TERM GOAL #2   Title  Pt will have 1/2 grade improvement OR > in all MMT tested in order to decrease pain, maximize gait, and maximize balance in order to promote function at home and in the community.    Time  4    Period  Weeks    Status   Partially Met      PT SHORT TERM GOAL #3   Title  Pt will have improved 3MWT by 158f or > with LRAD in order to maximize her function at home and in the community.    Baseline  1/16: amb 1619fwith RW (was 20045fn eval) but no increases in LBP this date    Time  4    Period  Weeks    Status  On-going        PT Long Term Goals - 04/02/17 1306      PT LONG TERM GOAL #1   Title  Pt will have at least 50% improvement or > in standing and walking tolerance (at least 10 mins) before needing to rest with 2/10 LBP and BLE pain in order to allow pt to perform household chores with greater ease.     Time  8    Period  Weeks    Status  On-going      PT LONG TERM GOAL #2   Title  Pt will have improved 5xSTS to 25 sec or < with 1 to no UE support to demo improved functional BLE strength and decrease risk for falls.    Baseline  1/16: 38.7 sec, was 44sec    Time  8    Period  Weeks    Status  On-going      PT LONG TERM GOAL #3   Title  Pt will be able to perform SLS on BLE for 10 sec or > with 1 to no UE assist in order to maximize gait and decrease risk for falls.     Baseline  1/16: 10 sec on RLE with 1 UE support, 9 sec on L with 1 UE support    Time  8    Period  Weeks    Status  Partially Met      PT LONG TERM GOAL #  4   Title  Pt will be able to perform the TUG in 30 sec or < with LRAD in order to demo improved balance, decrease risk for falls, and maximize community access.     Baseline  1/16: 1:06 with RW (revised goal to say 30 sec or < compared to 12 sec or <)    Time  8    Period  Weeks    Status  Revised      PT LONG TERM GOAL #5   Title  Pt will have improved lumbar ROM by 25% or > throughout in order to decrease pain and allow pt to perform ADLs with greater ease.     Time  8    Period  Weeks    Status  On-going            Plan - 04/11/17 1612    Clinical Impression Statement  This session continued to focus on strengthening of patient's lower extremities,  flexibility, overall functional mobility, and balance. Patient initially performed seated stretches and then seated strengthening followed by supine strengthening exercises. Patient's SpO2 was taken and found to be 98% at start of session. Following sit to stand exercises patient's SpO2 was at 97%. Patient demonstrated decreased range with supine bridging. In addition, patient demonstrated increased time with performing all activities. Particularly with sit to stands. Patient then performed standing exercises including heel raises followed by toe raises. Patient required verbal cues to prevent patient from leaning back when performing toe raises. This session incorporated tandem balance to challenge patient's balance in a narrow base of support. Patient required verbal cueing to bring feet more in line. Patient was able to maintain balance for several seconds before requiring upper extremity support with each attempt. Patient would benefit from continued progression of lower extremity strengthening, balance, and overall functional mobility.     Rehab Potential  Fair    PT Frequency  2x / week    PT Duration  8 weeks    PT Treatment/Interventions  ADLs/Self Care Home Management;Cryotherapy;Electrical Stimulation;Moist Heat;Traction;DME Instruction;Gait training;Stair training;Functional mobility training;Therapeutic activities;Therapeutic exercise;Balance training;Patient/family education;Manual techniques;Passive range of motion;Dry needling;Energy conservation;Taping    PT Next Visit Plan  Continue Nustep focusing on speed; Continue to progress functional strengthening, balance and gait training; practice bed mobility; trial HS.  Trial with forward lunges onto step next session.      PT Home Exercise Plan  eval: SKTC; 1/2: supine clams with RTB; 1/11: seated march, LAQ, ball squeezes    Consulted and Agree with Plan of Care  Patient       Patient will benefit from skilled therapeutic intervention in  order to improve the following deficits and impairments:  Abnormal gait, Decreased activity tolerance, Decreased balance, Decreased endurance, Decreased mobility, Decreased range of motion, Decreased strength, Difficulty walking, Hypomobility, Increased fascial restricitons, Increased muscle spasms, Impaired flexibility, Improper body mechanics, Postural dysfunction, Obesity, Pain  Visit Diagnosis: Chronic bilateral low back pain without sciatica  Chronic pain of right knee  Muscle weakness (generalized)  Difficulty in walking, not elsewhere classified     Problem List Patient Active Problem List   Diagnosis Date Noted  . Other hyperparathyroidism ([place]) 11/25/2016  . Hypercalcemia 08/09/2016  . Osteoporosis 10/08/2013  . Hyperlipemia 11/30/2012  . Arthritis of knee, degenerative 09/23/2012  . Knee bursitis 09/23/2012  . Bacteremia 08/13/2012  . Acute pyelonephritis 08/11/2012  . Dehydration 08/11/2012  . Hyponatremia 08/11/2012  . ARF (acute renal failure) ([place]) 08/11/2012  . Uncontrolled type 2  diabetes mellitus with complication, without long-term current use of insulin ([place]) 08/11/2012  . HTN (hypertension) 08/11/2012  . Hypothyroidism 08/11/2012  . Gait abnormality 04/22/2012  . Ankle fracture, bimalleolar, closed 12/10/2011  . Skin breakdown 12/10/2011   Clarene Critchley PT, DPT 4:55 PM, 04/11/17 [place], [place], [address] Phone: [phone]   Fax:  [phone]  Name: Cynthia Marks MRN: [id number] Date of Birth: 1932-07-29

## 2017-04-16 ENCOUNTER — Encounter (HOSPITAL_COMMUNITY): Payer: Self-pay

## 2017-04-16 ENCOUNTER — Ambulatory Visit (HOSPITAL_COMMUNITY): Payer: Medicare Other

## 2017-04-16 DIAGNOSIS — G8929 Other chronic pain: Secondary | ICD-10-CM | POA: Diagnosis not present

## 2017-04-16 DIAGNOSIS — M545 Low back pain: Secondary | ICD-10-CM | POA: Diagnosis not present

## 2017-04-16 DIAGNOSIS — M25561 Pain in right knee: Secondary | ICD-10-CM

## 2017-04-16 DIAGNOSIS — M6281 Muscle weakness (generalized): Secondary | ICD-10-CM

## 2017-04-16 DIAGNOSIS — R262 Difficulty in walking, not elsewhere classified: Secondary | ICD-10-CM | POA: Diagnosis not present

## 2017-04-16 NOTE — Patient Instructions (Addendum)
Hip Flexion (Standing)    With left foot on ____ inch step, raise other hip at right angle, letting knee bend. Repeat 10____ times per set. Do _1___ sets per session. Do __2__ sessions per day.  http://orth.exer.us/700   Copyright  VHI. All rights reserved.

## 2017-04-16 NOTE — Therapy (Signed)
[place], [place], [address] Phone: [phone]   Fax:  [phone]  Physical Therapy Treatment  Patient Details  Name: Cynthia Marks MRN: [id number] Date of Birth: 03-09-1933 Referring Provider: Arther Abbott, MD   Encounter Date: 04/16/2017  PT End of Session - 04/16/17 1339    Visit Number  10 Corrected visit number    Number of Visits  16    Date for PT Re-Evaluation  04/29/17    Authorization Type  Medicare (secondary: generic commercial)    Authorization Time Period  03/04/17 to 04/29/17    Authorization - Visit Number  10 Corrected visit count    Authorization - Number of Visits  16    PT Start Time  6962    PT Stop Time  1351    PT Time Calculation (min)  48 min    Equipment Utilized During Treatment  Gait belt    Activity Tolerance  Patient tolerated treatment well    Behavior During Therapy  [facility] for tasks assessed/performed       Past Medical History:  Diagnosis Date  . Arthritis    Knees  . Diabetes mellitus   . Hypertension   . Macular degeneration   . Thyroid disease     Past Surgical History:  Procedure Laterality Date  . ABDOMINAL HYSTERECTOMY    . COLONOSCOPY    . SHOULDER SURGERY      There were no vitals filed for this visit.  Subjective Assessment - 04/16/17 1256    Subjective  Patient stated she is not having too much pain today. But she is having pain in her right ankle and right knee.     Limitations  Walking    How long can you sit comfortably?  no real issues    How long can you stand comfortably?  getting up from sitting; <5 mins    How long can you walk comfortably?  <5 mins    Patient Stated Goals  be able to walk again    Currently in Pain?  Yes    Pain Score  4     Pain Location  Knee    Pain Orientation  Right    Pain Descriptors / Indicators  Sore    Pain Onset  More than a month ago    Pain Location  Ankle    Pain Orientation  Right    Pain  Descriptors / Indicators  Sore    Pain Type  Chronic pain                      [facility] Adult PT Treatment/Exercise - 04/16/17 0001      Ambulation/Gait   Ambulation/Gait  Yes    Ambulation Distance (Feet)  160 Feet 3MWT    Assistive device  Rolling walker    Gait Pattern  Decreased stride length    Ambulation Surface  Level      Knee/Hip Exercises: Stretches   Active Hamstring Stretch  2 reps;30 seconds seated with LE on 12in step      Knee/Hip Exercises: Standing   Heel Raises  Both;10 reps    Heel Raises Limitations  heel and toe    Hip Flexion  1 set;10 reps;Stengthening;Both    Other Standing Knee Exercises  Tandem stance x 5 attempts each side without upper extremity assist  Cueing to bring feet more in line to challenge balance      Knee/Hip  Exercises: Seated   Long Arc Quad  Both;2 sets;10 reps    Long [employer] Limitations  3" holds    Sit to [employer]  2 sets;5 reps;without UE support From 22 inch mat table      Knee/Hip Exercises: Supine   Bridges  2 sets;10 reps;Limitations    Bridges Limitations  2 second hold; partial range             PT Education - 04/16/17 1358    Education provided  Yes    Education Details  HEP standing hip flexion    Person(s) Educated  Patient;Spouse    Methods  Explanation    Comprehension  Verbalized understanding;Returned demonstration       PT Short Term Goals - 04/16/17 1340      PT SHORT TERM GOAL #1   Title  Pt will be independent with HEP and perform consistently in order to maximize overall function.    Time  4    Period  Weeks    Status  Achieved      PT SHORT TERM GOAL #2   Title  Pt will have 1/2 grade improvement OR > in all MMT tested in order to decrease pain, maximize gait, and maximize balance in order to promote function at home and in the community.    Time  4    Period  Weeks    Status  Partially Met      PT SHORT TERM GOAL #3   Title  Pt will have improved 3MWT by 160f or > with LRAD in  order to maximize her function at home and in the community.    Baseline  1/16: amb 1675fwith RW (was 20034fn eval) but no increases in LBP; 04/16/2017 - 160 ft    Time  4    Period  Weeks    Status  On-going        PT Long Term Goals - 04/16/17 1341      PT LONG TERM GOAL #1   Title  Pt will have at least 50% improvement or > in standing and walking tolerance (at least 10 mins) before needing to rest with 2/10 LBP and BLE pain in order to allow pt to perform household chores with greater ease.     Time  8    Period  Weeks    Status  On-going      PT LONG TERM GOAL #2   Title  Pt will have improved 5xSTS to 25 sec or < with 1 to no UE support to demo improved functional BLE strength and decrease risk for falls.    Baseline  1/16: 38.7 sec, was 44sec    Time  8    Period  Weeks    Status  On-going      PT LONG TERM GOAL #3   Title  Pt will be able to perform SLS on BLE for 10 sec or > with 1 to no UE assist in order to maximize gait and decrease risk for falls.     Baseline  1/16: 10 sec on RLE with 1 UE support, 9 sec on L with 1 UE support    Time  8    Period  Weeks    Status  Partially Met      PT LONG TERM GOAL #4   Title  Pt will be able to perform the TUG in 30 sec or < with LRAD in order to demo improved  balance, decrease risk for falls, and maximize community access.     Baseline  1/16: 1:06 with RW (revised goal to say 30 sec or < compared to 12 sec or <)    Time  8    Period  Weeks    Status  Revised      PT LONG TERM GOAL #5   Title  Pt will have improved lumbar ROM by 25% or > throughout in order to decrease pain and allow pt to perform ADLs with greater ease.     Time  8    Period  Weeks    Status  On-going            Plan - 04/16/17 1356    Clinical Impression Statement  Patient tolerated treatment fairly well today with increased weight bearing activities; STS exercises were performed without UE support from a 21 1/2" surface which is slightly  better than last visit. Patient struggles to attain toe raises without blocking to keep from leaning back. Patient would continue to benefit from skillled physical therapy to continue strengthening lower body to improve functional abilities. PT will continue to advance weight bearing activities and HEP.    Rehab Potential  Fair    PT Frequency  2x / week    PT Duration  8 weeks    PT Treatment/Interventions  ADLs/Self Care Home Management;Cryotherapy;Electrical Stimulation;Moist Heat;Traction;DME Instruction;Gait training;Stair training;Functional mobility training;Therapeutic activities;Therapeutic exercise;Balance training;Patient/family education;Manual techniques;Passive range of motion;Dry needling;Energy conservation;Taping    PT Next Visit Plan  Continue Nustep focusing on speed; Continue to progress functional strengthening, balance and gait training; practice bed mobility; trial HS.  Trial with forward lunges onto step next session.      PT Home Exercise Plan  eval: SKTC; 1/2: supine clams with RTB; 1/11: seated march, LAQ, ball squeezes; 1/30 - standing hip flexion    Consulted and Agree with Plan of Care  Patient       Patient will benefit from skilled therapeutic intervention in order to improve the following deficits and impairments:  Abnormal gait, Decreased activity tolerance, Decreased balance, Decreased endurance, Decreased mobility, Decreased range of motion, Decreased strength, Difficulty walking, Hypomobility, Increased fascial restricitons, Increased muscle spasms, Impaired flexibility, Improper body mechanics, Postural dysfunction, Obesity, Pain  Visit Diagnosis: Chronic bilateral low back pain without sciatica  Chronic pain of right knee  Muscle weakness (generalized)  Difficulty in walking, not elsewhere classified     Problem List Patient Active Problem List   Diagnosis Date Noted  . Other hyperparathyroidism ([place]) 11/25/2016  . Hypercalcemia 08/09/2016  .  Osteoporosis 10/08/2013  . Hyperlipemia 11/30/2012  . Arthritis of knee, degenerative 09/23/2012  . Knee bursitis 09/23/2012  . Bacteremia 08/13/2012  . Acute pyelonephritis 08/11/2012  . Dehydration 08/11/2012  . Hyponatremia 08/11/2012  . ARF (acute renal failure) ([place]) 08/11/2012  . Uncontrolled type 2 diabetes mellitus with complication, without long-term current use of insulin ([place]) 08/11/2012  . HTN (hypertension) 08/11/2012  . Hypothyroidism 08/11/2012  . Gait abnormality 04/22/2012  . Ankle fracture, bimalleolar, closed 12/10/2011  . Skin breakdown 12/10/2011    Floria Raveling. Hartnett-Rands, MS, PT [facility] [place] #[id number] 04/16/2017, 2:04 PM  [facility] [address] [place], [place], [address] Phone: [phone]   Fax:  [phone]  Name: SHANEA KARNEY MRN: [id number] Date of Birth: 31-Aug-1932

## 2017-04-17 ENCOUNTER — Telehealth (HOSPITAL_COMMUNITY): Payer: Self-pay | Admitting: Family Medicine

## 2017-04-17 NOTE — Telephone Encounter (Signed)
04/17/17  pt cx because she said her husband had to go to a funeral for 2/1 appt

## 2017-04-18 ENCOUNTER — Encounter (HOSPITAL_COMMUNITY): Payer: Medicare Other

## 2017-04-21 ENCOUNTER — Telehealth (HOSPITAL_COMMUNITY): Payer: Self-pay

## 2017-04-21 ENCOUNTER — Other Ambulatory Visit: Payer: Self-pay | Admitting: "Endocrinology

## 2017-04-21 ENCOUNTER — Ambulatory Visit (HOSPITAL_COMMUNITY): Payer: Medicare Other | Attending: Orthopedic Surgery

## 2017-04-21 DIAGNOSIS — M545 Low back pain: Secondary | ICD-10-CM | POA: Insufficient documentation

## 2017-04-21 DIAGNOSIS — G8929 Other chronic pain: Secondary | ICD-10-CM | POA: Insufficient documentation

## 2017-04-21 DIAGNOSIS — R262 Difficulty in walking, not elsewhere classified: Secondary | ICD-10-CM | POA: Insufficient documentation

## 2017-04-21 DIAGNOSIS — M6281 Muscle weakness (generalized): Secondary | ICD-10-CM | POA: Insufficient documentation

## 2017-04-21 DIAGNOSIS — M25561 Pain in right knee: Secondary | ICD-10-CM | POA: Insufficient documentation

## 2017-04-21 NOTE — Telephone Encounter (Signed)
No show #1; called and spoke with pt re missed appointment. She stated that she was not aware that she had an appointment today. PT made pt some more appointments as today was her last scheduled visit.   Geraldine Solar PT, DPT

## 2017-04-22 ENCOUNTER — Ambulatory Visit (HOSPITAL_COMMUNITY): Payer: Medicare Other

## 2017-04-22 ENCOUNTER — Encounter (HOSPITAL_COMMUNITY): Payer: Self-pay

## 2017-04-22 DIAGNOSIS — M545 Low back pain: Principal | ICD-10-CM

## 2017-04-22 DIAGNOSIS — M25561 Pain in right knee: Secondary | ICD-10-CM | POA: Diagnosis not present

## 2017-04-22 DIAGNOSIS — G8929 Other chronic pain: Secondary | ICD-10-CM | POA: Diagnosis not present

## 2017-04-22 DIAGNOSIS — R262 Difficulty in walking, not elsewhere classified: Secondary | ICD-10-CM

## 2017-04-22 DIAGNOSIS — M6281 Muscle weakness (generalized): Secondary | ICD-10-CM | POA: Diagnosis not present

## 2017-04-22 NOTE — Therapy (Signed)
[employer] [place], [place], [address] Phone: [phone]   Fax:  [phone]  Physical Therapy Treatment  Patient Details  Name: Cynthia Marks MRN: [id number] Date of Birth: 1932/11/13 Referring Provider: Arther Abbott, MD   Encounter Date: 04/22/2017  PT End of Session - 04/22/17 1118    Visit Number  11    Number of Visits  16    Date for PT Re-Evaluation  04/29/17    Authorization Type  Medicare (secondary: generic commercial)    Authorization Time Period  03/04/17 to 04/29/17    Authorization - Visit Number  11    Authorization - Number of Visits  16    PT Start Time  1113    PT Stop Time  1200    PT Time Calculation (min)  47 min    Equipment Utilized During Treatment  Gait belt    Activity Tolerance  Patient tolerated treatment well    Behavior During Therapy  [facility] for tasks assessed/performed       Past Medical History:  Diagnosis Date  . Arthritis    Knees  . Diabetes mellitus   . Hypertension   . Macular degeneration   . Thyroid disease     Past Surgical History:  Procedure Laterality Date  . ABDOMINAL HYSTERECTOMY    . COLONOSCOPY    . SHOULDER SURGERY      There were no vitals filed for this visit.  Subjective Assessment - 04/22/17 1116    Subjective  Pt states that her R knee is the only thing bugging her. She was able to walk a little more than usual over the weekend. This warm weather is helping.    Limitations  Walking    How long can you sit comfortably?  no real issues    How long can you stand comfortably?  getting up from sitting; <5 mins    How long can you walk comfortably?  <5 mins    Patient Stated Goals  be able to walk again    Currently in Pain?  Yes    Pain Score  3     Pain Location  Knee    Pain Orientation  Right    Pain Descriptors / Indicators  Sore    Pain Type  Chronic pain    Pain Onset  More than a month ago    Pain Frequency  Intermittent    Aggravating  Factors   walking, standing, sitting without support    Pain Relieving Factors  sitting with back support, heating pad, laying in bed    Effect of Pain on Daily Activities  can't do things like she used to    Multiple Pain Sites  No          [facility] Adult PT Treatment/Exercise - 04/22/17 0001      Knee/Hip Exercises: Stretches   Active Hamstring Stretch  Both;3 reps;30 seconds    Active Hamstring Stretch Limitations  seated, LE on 12" step    Gastroc Stretch  Both;3 reps;30 seconds    Gastroc Stretch Limitations  slant board      Knee/Hip Exercises: Aerobic   Nustep  x5 mins maintaining SPM >50, to improve gait speed and sequencing; average 58 SPM      Knee/Hip Exercises: Standing   Hip Abduction  Stengthening;Both;2 sets;10 reps    Abduction Limitations  RTB    Forward Step Up  Both;10 reps;Hand Hold: 1;Hand Hold: 2;Step Height: 6"  Forward Step Up Limitations  1 UE assist with LLE, BUE assist with RLE    Functional Squat  2 sets;10 reps    Functional Squat Limitations  BUE support on // bars with chair behind for form      Knee/Hip Exercises: Seated   Long Arc Quad  Both;2 sets;10 reps    Long Arc Quad Weight  2 lbs.    Long [employer] Limitations  3" holds    Knee/Hip Flexion  marching 2x10 with bil 2# ankle weights    Sit to [place]  5 reps;with UE support from standard chair, UE on chair rails only          PT Short Term Goals - 04/16/17 1340      PT SHORT TERM GOAL #1   Title  Pt will be independent with HEP and perform consistently in order to maximize overall function.    Time  4    Period  Weeks    Status  Achieved      PT SHORT TERM GOAL #2   Title  Pt will have 1/2 grade improvement OR > in all MMT tested in order to decrease pain, maximize gait, and maximize balance in order to promote function at home and in the community.    Time  4    Period  Weeks    Status  Partially Met      PT SHORT TERM GOAL #3   Title  Pt will have improved 3MWT by 12f or >  with LRAD in order to maximize her function at home and in the community.    Baseline  1/16: amb 1637fwith RW (was 20058fn eval) but no increases in LBP; 04/16/2017 - 160 ft    Time  4    Period  Weeks    Status  On-going        PT Long Term Goals - 04/16/17 1341      PT LONG TERM GOAL #1   Title  Pt will have at least 50% improvement or > in standing and walking tolerance (at least 10 mins) before needing to rest with 2/10 LBP and BLE pain in order to allow pt to perform household chores with greater ease.     Time  8    Period  Weeks    Status  On-going      PT LONG TERM GOAL #2   Title  Pt will have improved 5xSTS to 25 sec or < with 1 to no UE support to demo improved functional BLE strength and decrease risk for falls.    Baseline  1/16: 38.7 sec, was 44sec    Time  8    Period  Weeks    Status  On-going      PT LONG TERM GOAL #3   Title  Pt will be able to perform SLS on BLE for 10 sec or > with 1 to no UE assist in order to maximize gait and decrease risk for falls.     Baseline  1/16: 10 sec on RLE with 1 UE support, 9 sec on L with 1 UE support    Time  8    Period  Weeks    Status  Partially Met      PT LONG TERM GOAL #4   Title  Pt will be able to perform the TUG in 30 sec or < with LRAD in order to demo improved balance, decrease risk for falls, and maximize  community access.     Baseline  1/16: 1:06 with RW (revised goal to say 30 sec or < compared to 12 sec or <)    Time  8    Period  Weeks    Status  Revised      PT LONG TERM GOAL #5   Title  Pt will have improved lumbar ROM by 25% or > throughout in order to decrease pain and allow pt to perform ADLs with greater ease.     Time  8    Period  Weeks    Status  On-going            Plan - 04/22/17 1159    Clinical Impression Statement  Functional and gluteal strengthening focus of today's session. Pt continues to demo increased deficits in each of these aspects AEB increased time to complete tasks. Pt  able to perform multiple STS from standard chair with BUE on chair rails but no support thereafter (i.e. no RW or // bars to assist in coming to full upright standing), demo'ing some progress in this functional task. Ended on Nustep focusing on maintaining SPM >60 while on L2 to facilitate improved overall gait mechanics and speed in order to decrease risk for falls. Still needing to encourage pt to perform HEP. Continue as planned.    Rehab Potential  Fair    PT Frequency  2x / week    PT Duration  8 weeks    PT Treatment/Interventions  ADLs/Self Care Home Management;Cryotherapy;Electrical Stimulation;Moist Heat;Traction;DME Instruction;Gait training;Stair training;Functional mobility training;Therapeutic activities;Therapeutic exercise;Balance training;Patient/family education;Manual techniques;Passive range of motion;Dry needling;Energy conservation;Taping    PT Next Visit Plan  Continue Nustep focusing on speed; Continue to progress functional strengthening, balance and gait training; practice bed mobility; Trial with forward lunges onto step next session.      PT Home Exercise Plan  eval: SKTC; 1/2: supine clams with RTB; 1/11: seated march, LAQ, ball squeezes; 1/30 - standing hip flexion    Consulted and Agree with Plan of Care  Patient       Patient will benefit from skilled therapeutic intervention in order to improve the following deficits and impairments:  Abnormal gait, Decreased activity tolerance, Decreased balance, Decreased endurance, Decreased mobility, Decreased range of motion, Decreased strength, Difficulty walking, Hypomobility, Increased fascial restricitons, Increased muscle spasms, Impaired flexibility, Improper body mechanics, Postural dysfunction, Obesity, Pain  Visit Diagnosis: Chronic bilateral low back pain without sciatica  Chronic pain of right knee  Muscle weakness (generalized)  Difficulty in walking, not elsewhere classified     Problem List Patient Active  Problem List   Diagnosis Date Noted  . Other hyperparathyroidism ([place]) 11/25/2016  . Hypercalcemia 08/09/2016  . Osteoporosis 10/08/2013  . Hyperlipemia 11/30/2012  . Arthritis of knee, degenerative 09/23/2012  . Knee bursitis 09/23/2012  . Bacteremia 08/13/2012  . Acute pyelonephritis 08/11/2012  . Dehydration 08/11/2012  . Hyponatremia 08/11/2012  . ARF (acute renal failure) ([place]) 08/11/2012  . Uncontrolled type 2 diabetes mellitus with complication, without long-term current use of insulin ([place]) 08/11/2012  . HTN (hypertension) 08/11/2012  . Hypothyroidism 08/11/2012  . Gait abnormality 04/22/2012  . Ankle fracture, bimalleolar, closed 12/10/2011  . Skin breakdown 12/10/2011      Geraldine Solar PT, DPT  [facility] [address] [place], [place], [address] Phone: [phone]   Fax:  [phone]  Name: Cynthia Marks MRN: [id number] Date of Birth: 1932-10-31

## 2017-04-22 NOTE — Patient Instructions (Signed)
  SEATED HAMSTRING STRETCH  While seated, rest your heel on the floor with your knee straight and gently lean forward until a stretch is felt behind your knee/thigh.  Perform 1-2x/day, 3-5 stretches on each leg holding for 30-60 seconds each

## 2017-04-24 ENCOUNTER — Encounter (HOSPITAL_COMMUNITY): Payer: Self-pay

## 2017-04-24 ENCOUNTER — Ambulatory Visit (HOSPITAL_COMMUNITY): Payer: Medicare Other

## 2017-04-24 DIAGNOSIS — G8929 Other chronic pain: Secondary | ICD-10-CM | POA: Diagnosis not present

## 2017-04-24 DIAGNOSIS — M6281 Muscle weakness (generalized): Secondary | ICD-10-CM

## 2017-04-24 DIAGNOSIS — M545 Low back pain: Secondary | ICD-10-CM | POA: Diagnosis not present

## 2017-04-24 DIAGNOSIS — M25561 Pain in right knee: Secondary | ICD-10-CM

## 2017-04-24 DIAGNOSIS — R262 Difficulty in walking, not elsewhere classified: Secondary | ICD-10-CM

## 2017-04-24 NOTE — Patient Instructions (Signed)
  LOOPED ELASTIC BAND HIP ABDUCTION  While standing with an elastic band looped around your ankles, move the target leg out to the side as shown.   Add in with other exercises, 2-3 sets of 15-20 reps on each leg with red band

## 2017-04-24 NOTE — Therapy (Signed)
[place], [place], [address] Phone: [phone]   Fax:  [phone]  Physical Therapy Treatment  Patient Details  Name: Cynthia Marks MRN: [id number] Date of Birth: [birth year] Referring Provider: Arther Abbott, MD   Encounter Date: 04/24/2017  PT End of Session - 04/24/17 1105    Visit Number  12    Number of Visits  16    Date for PT Re-Evaluation  04/29/17    Authorization Type  Medicare (secondary: generic commercial)    Authorization Time Period  03/04/17 to 04/29/17    Authorization - Visit Number  12    Authorization - Number of Visits  16    PT Start Time  1105    PT Stop Time  1154    PT Time Calculation (min)  49 min    Equipment Utilized During Treatment  Gait belt    Activity Tolerance  Patient tolerated treatment well    Behavior During Therapy  [facility] for tasks assessed/performed       Past Medical History:  Diagnosis Date  . Arthritis    Knees  . Diabetes mellitus   . Hypertension   . Macular degeneration   . Thyroid disease     Past Surgical History:  Procedure Laterality Date  . ABDOMINAL HYSTERECTOMY    . COLONOSCOPY    . SHOULDER SURGERY      There were no vitals filed for this visit.  Subjective Assessment - 04/24/17 1105    Subjective  Pt states that she is not in any pain, just experiencing some muscle soreness from last session.     Limitations  Walking    How long can you sit comfortably?  no real issues    How long can you stand comfortably?  getting up from sitting; <5 mins    How long can you walk comfortably?  <5 mins    Patient Stated Goals  be able to walk again    Currently in Pain?  No/denies    Pain Onset  More than a month ago           [facility] Adult PT Treatment/Exercise - 04/24/17 0001      Knee/Hip Exercises: Stretches   Active Hamstring Stretch  Both;3 reps;30 seconds    Active Hamstring Stretch Limitations  seated    Gastroc Stretch  Both;3  reps;30 seconds    Gastroc Stretch Limitations  slant board      Knee/Hip Exercises: Aerobic   Nustep  x5 mins, L3 maintaining SPM >60 SPM, to improve gait speed and sequencing      Knee/Hip Exercises: Standing   Forward Lunges  Both;10 reps    Forward Lunges Limitations  front foot on 4" step    Hip Abduction  Stengthening;Both;2 sets;15 reps    Abduction Limitations  RTB    Forward Step Up  Both;10 reps;Hand Hold: 1;Hand Hold: 2;Step Height: 6"    Forward Step Up Limitations  1 UE assist with LLE, BUE assist with RLE    Functional Squat  2 sets;10 reps    Functional Squat Limitations  BUE support on // bars with chair behind for form      Knee/Hip Exercises: Seated   Sit to Sand  2 sets;5 reps;with UE support;without UE support standard chair, UE on chair rails only during sit > stand      Balance Exercises - 04/24/17 1136      Balance Exercises: Standing  Tandem Stance  Eyes open 10 reps, + UE flexion OH with 2# weight bar    Marching Limitations  x10 each, 1 UE assist             PT Education - 04/24/17 1108    Education provided  Yes    Education Details  exercise technqiue    Person(s) Educated  Patient    Methods  Explanation;Demonstration    Comprehension  Verbalized understanding;Returned demonstration       PT Short Term Goals - 04/16/17 1340      PT SHORT TERM GOAL #1   Title  Pt will be independent with HEP and perform consistently in order to maximize overall function.    Time  4    Period  Weeks    Status  Achieved      PT SHORT TERM GOAL #2   Title  Pt will have 1/2 grade improvement OR > in all MMT tested in order to decrease pain, maximize gait, and maximize balance in order to promote function at home and in the community.    Time  4    Period  Weeks    Status  Partially Met      PT SHORT TERM GOAL #3   Title  Pt will have improved 3MWT by 110f or > with LRAD in order to maximize her function at home and in the community.    Baseline   1/16: amb 1685fwith RW (was 20070fn eval) but no increases in LBP; 04/16/2017 - 160 ft    Time  4    Period  Weeks    Status  On-going        PT Long Term Goals - 04/16/17 1341      PT LONG TERM GOAL #1   Title  Pt will have at least 50% improvement or > in standing and walking tolerance (at least 10 mins) before needing to rest with 2/10 LBP and BLE pain in order to allow pt to perform household chores with greater ease.     Time  8    Period  Weeks    Status  On-going      PT LONG TERM GOAL #2   Title  Pt will have improved 5xSTS to 25 sec or < with 1 to no UE support to demo improved functional BLE strength and decrease risk for falls.    Baseline  1/16: 38.7 sec, was 44sec    Time  8    Period  Weeks    Status  On-going      PT LONG TERM GOAL #3   Title  Pt will be able to perform SLS on BLE for 10 sec or > with 1 to no UE assist in order to maximize gait and decrease risk for falls.     Baseline  1/16: 10 sec on RLE with 1 UE support, 9 sec on L with 1 UE support    Time  8    Period  Weeks    Status  Partially Met      PT LONG TERM GOAL #4   Title  Pt will be able to perform the TUG in 30 sec or < with LRAD in order to demo improved balance, decrease risk for falls, and maximize community access.     Baseline  1/16: 1:06 with RW (revised goal to say 30 sec or < compared to 12 sec or <)    Time  8  Period  Weeks    Status  Revised      PT LONG TERM GOAL #5   Title  Pt will have improved lumbar ROM by 25% or > throughout in order to decrease pain and allow pt to perform ADLs with greater ease.     Time  8    Period  Weeks    Status  On-going            Plan - 04/24/17 1151    Clinical Impression Statement  Continued with established POC focusing on gluteal and functional strengthening, as well as balance this date. Pt's transfers steadily improving as she was able to perform from standard height chair, only using UE during sit > stand portion and stand > sit  only using LE to control self. Balance deficits and generally slowed movement patterns continue. Pt due for reassessment next visit.    Rehab Potential  Fair    PT Frequency  2x / week    PT Duration  8 weeks    PT Treatment/Interventions  ADLs/Self Care Home Management;Cryotherapy;Electrical Stimulation;Moist Heat;Traction;DME Instruction;Gait training;Stair training;Functional mobility training;Therapeutic activities;Therapeutic exercise;Balance training;Patient/family education;Manual techniques;Passive range of motion;Dry needling;Energy conservation;Taping    PT Next Visit Plan  reassess; Continue Nustep focusing on speed; Continue to progress functional strengthening, balance and gait training; practice bed mobility    PT Home Exercise Plan  eval: SKTC; 1/2: supine clams with RTB; 1/11: seated march, LAQ, ball squeezes; 1/30 - standing hip flexion; 2/7: standing hip abd with RTB    Consulted and Agree with Plan of Care  Patient       Patient will benefit from skilled therapeutic intervention in order to improve the following deficits and impairments:  Abnormal gait, Decreased activity tolerance, Decreased balance, Decreased endurance, Decreased mobility, Decreased range of motion, Decreased strength, Difficulty walking, Hypomobility, Increased fascial restricitons, Increased muscle spasms, Impaired flexibility, Improper body mechanics, Postural dysfunction, Obesity, Pain  Visit Diagnosis: Chronic bilateral low back pain without sciatica  Chronic pain of right knee  Muscle weakness (generalized)  Difficulty in walking, not elsewhere classified     Problem List Patient Active Problem List   Diagnosis Date Noted  . Other hyperparathyroidism ([place]) 11/25/2016  . Hypercalcemia 08/09/2016  . Osteoporosis 10/08/2013  . Hyperlipemia 11/30/2012  . Arthritis of knee, degenerative 09/23/2012  . Knee bursitis 09/23/2012  . Bacteremia 08/13/2012  . Acute pyelonephritis 08/11/2012  .  Dehydration 08/11/2012  . Hyponatremia 08/11/2012  . ARF (acute renal failure) ([place]) 08/11/2012  . Uncontrolled type 2 diabetes mellitus with complication, without long-term current use of insulin ([place]) 08/11/2012  . HTN (hypertension) 08/11/2012  . Hypothyroidism 08/11/2012  . Gait abnormality 04/22/2012  . Ankle fracture, bimalleolar, closed 12/10/2011  . Skin breakdown 12/10/2011       Geraldine Solar PT, DPT  [facility] [address] [place] [facility] [place], [place], [address] Phone: [phone]   Fax:  [phone]  Name: YANISSA MICHALSKY MRN: [id number] Date of Birth: [birth year]

## 2017-04-29 ENCOUNTER — Ambulatory Visit (HOSPITAL_COMMUNITY): Payer: Medicare Other

## 2017-04-29 ENCOUNTER — Encounter (HOSPITAL_COMMUNITY): Payer: Self-pay

## 2017-04-29 DIAGNOSIS — M545 Low back pain: Secondary | ICD-10-CM | POA: Diagnosis not present

## 2017-04-29 DIAGNOSIS — M6281 Muscle weakness (generalized): Secondary | ICD-10-CM

## 2017-04-29 DIAGNOSIS — M25561 Pain in right knee: Secondary | ICD-10-CM

## 2017-04-29 DIAGNOSIS — G8929 Other chronic pain: Secondary | ICD-10-CM | POA: Diagnosis not present

## 2017-04-29 DIAGNOSIS — R262 Difficulty in walking, not elsewhere classified: Secondary | ICD-10-CM | POA: Diagnosis not present

## 2017-04-29 NOTE — Patient Instructions (Signed)
  SIT TO STAND - NO SUPPORT  Start by scooting close to the front of the chair.  Next, lean forward at your trunk and reach forward with your arms and rise to standing without using your hands to push off from the chair or other object.   Use your arms as a counter-balance by reaching forward when in sitting and lower them as you approach standing.   Perform 1x/day, 2-3 sets of 10-15 reps, focusing on decreasing pressure through hands/arms    Mini Squat at Counter  Standing with feet shoulder length apart, bend knees slightly and return to standing position. Don't let knees go over toes and keep hands lightly on counter top for support.  Perform 1x/day, 2-3 sets of 10-15 reps

## 2017-04-29 NOTE — Therapy (Signed)
[place], [place], [address] Phone: [phone]   Fax:  [phone]  Physical Therapy Treatment/Reassessment  Patient Details  Name: Cynthia Marks MRN: [id number] Date of Birth: 1932-07-27 Referring Provider: Arther Abbott, MD   Encounter Date: 04/29/2017  PT End of Session - 04/29/17 1103    Visit Number  13    Number of Visits  24    Date for PT Re-Evaluation  05/27/17    Authorization Type  Medicare (secondary: generic commercial)    Authorization Time Period  03/04/17 to 04/29/17; NEW: 04/29/17 to 05/27/17    Authorization - Visit Number  13    Authorization - Number of Visits  24    PT Start Time  1100    PT Stop Time  1147    PT Time Calculation (min)  47 min    Equipment Utilized During Treatment  Gait belt    Activity Tolerance  Patient tolerated treatment well    Behavior During Therapy  [facility] for tasks assessed/performed       Past Medical History:  Diagnosis Date  . Arthritis    Knees  . Diabetes mellitus   . Hypertension   . Macular degeneration   . Thyroid disease     Past Surgical History:  Procedure Laterality Date  . ABDOMINAL HYSTERECTOMY    . COLONOSCOPY    . SHOULDER SURGERY      There were no vitals filed for this visit.  Subjective Assessment - 04/29/17 1103    Subjective  Pt states she's moving slow this morning, which she attributes to the weather.    Limitations  Walking    How long can you sit comfortably?  no real issues    How long can you stand comfortably?  getting up from sitting; <5 mins    How long can you walk comfortably?  <5 mins    Patient Stated Goals  be able to walk again    Currently in Pain?  No/denies    Pain Onset  More than a month ago         [facility] PT Assessment - 04/29/17 0001      Assessment   Medical Diagnosis  spinal stenosis of lumbar region with neurogenic claudication    Referring Provider  Arther Abbott, MD    Next MD Visit   May 28, 2017      AROM   Lumbar Flexion  50% limited or > was 50% limited    Lumbar Extension  50% limited or > was 50% limtied    Lumbar - Right Side Bend  just past mid-thigh; non-painful was just past mid thigh    Lumbar - Left Side Bend  almost to knee; non-painful was  almost to knee    Lumbar - Right Rotation  50% limited; non-painful was 50% limited    Lumbar - Left Rotation  50% limited; non-painful was 50% limited      Strength   Right Hip Flexion  4+/5 was 4+    Left Hip Flexion  4+/5 was 4+    Right Knee Flexion  5/5 was 4+    Right Knee Extension  4+/5 was 4+    Left Knee Flexion  5/5 was 4+    Left Knee Extension  4+/5 was 4+    Right Ankle Dorsiflexion  4+/5 was 4    Left Ankle Dorsiflexion  4+/5 was 4+      Ambulation/Gait  Ambulation Distance (Feet)  204 Feet 3MWT    Assistive device  Rolling walker    Gait Pattern  Decreased stride length    Gait Comments  no increases in pain      Balance   Balance Assessed  Yes      Static Standing Balance   Static Standing Balance -  Activities   Single Leg Stance - Right Leg;Single Leg Stance - Left Leg    Static Standing - Comment/# of Minutes  R: 15 sec 1UE L:  15 sec, 1UE      Standardized Balance Assessment   Standardized Balance Assessment  Five Times Sit to Stand;Timed Up and Go Test    Five times sit to stand comments   51.4 sec first trial; 46.8 sec 2nd trial -- 1 UE on chair, 1 UE on thigh for both trials)      Timed Up and Go Test   TUG  Normal TUG    TUG Comments  1:05 RW          PT Education - 04/29/17 1156    Education provided  Yes    Education Details  reassessment findings, stressed importance of performing HEP, updated HEP    Person(s) Educated  Patient    Methods  Explanation;Demonstration;Handout    Comprehension  Verbalized understanding;Returned demonstration       PT Short Term Goals - 04/29/17 1103      PT SHORT TERM GOAL #1   Title  Pt will be independent with HEP and perform  consistently in order to maximize overall function.    Baseline  2/12: pt states that she does not perform exercises everyday or as instructed    Time  4    Period  Weeks    Status  On-going      PT SHORT TERM GOAL #2   Title  Pt will have 1/2 grade improvement OR > in all MMT tested in order to decrease pain, maximize gait, and maximize balance in order to promote function at home and in the community.    Baseline  2/12: see MMT    Time  4    Period  Weeks    Status  Partially Met      PT SHORT TERM GOAL #3   Title  Pt will have improved 3MWT by 177f or > with LRAD in order to maximize her function at home and in the community.    Baseline  2/12: 2050fwith RW, was 11871ft eval    Time  4    Period  Weeks    Status  On-going        PT Long Term Goals - 04/29/17 1104      PT LONG TERM GOAL #1   Title  Pt will have at least 50% improvement or > in standing and walking tolerance (at least 10 mins) before needing to rest with 2/10 LBP and BLE pain in order to allow pt to perform household chores with greater ease.     Baseline  2/12: 10 min standing; 10-15 mins, no pain    Time  8    Period  Weeks    Status  Achieved      PT LONG TERM GOAL #2   Title  Pt will have improved 5xSTS to 25 sec or < with 1 to no UE support to demo improved functional BLE strength and decrease risk for falls.    Baseline  2/12: 51 sec and  46 sec; was 38.7 sec, was 44sec on 1/16    Time  8    Period  Weeks    Status  On-going      PT LONG TERM GOAL #3   Title  Pt will be able to perform SLS on BLE for 10 sec or > with 1 to no UE assist in order to maximize gait and decrease risk for falls.     Baseline  2/12: 15 sec bil SLS with 1 UE support on RW    Time  8    Period  Weeks    Status  Achieved      PT LONG TERM GOAL #4   Title  Pt will be able to perform the TUG in 30 sec or < with LRAD in order to demo improved balance, decrease risk for falls, and maximize community access.     Baseline   2/12: 1:05 with RW (was 1:06 with RW on 1/16)    Time  8    Period  Weeks    Status  On-going      PT LONG TERM GOAL #5   Title  Pt will have improved lumbar ROM by 25% or > throughout in order to decrease pain and allow pt to perform ADLs with greater ease.     Time  8    Period  Weeks    Status  On-going            Plan - 04/29/17 1157    Clinical Impression Statement  PT reassessed pt's goals and outcome measures this date. Pt has made progress towards goals as illustrated above. Her overall complaints of LBP have significantly improved as she rarely reports any pain. Her balance has improved as she was able to perform SLS with 1 UE support for 15 sec bil, her MMT has improved by 1/2 a grade throughout, except for hip flexion, and her tolerance to standing and ambulation have both improved. Pt's performance in functional tests, 5xSTS and TUG, have not shown significant improvements, but her distance in the 3MWT did improve by 41f from initial evaluation. Pt verbalizes that she is not doing enough at home and does not perform her exercises every day; PT explained the importance of performing these exercises and why they are so beneficial and she verbalized understanding. PT recommends brief extension to continue to work on pt's overall strength, power, and functional strength and improving speed at which pt performs tasks to decrease risk for falls and allow her to perform ADLs and IADLs at home with greater ease.     Rehab Potential  Fair    PT Frequency  2x / week    PT Duration  4 weeks    PT Treatment/Interventions  ADLs/Self Care Home Management;Cryotherapy;Electrical Stimulation;Moist Heat;Traction;DME Instruction;Gait training;Stair training;Functional mobility training;Therapeutic activities;Therapeutic exercise;Balance training;Patient/family education;Manual techniques;Passive range of motion;Dry needling;Energy conservation;Taping    PT Next Visit Plan  Continue to progress  functional strengthening, balance and gait training; practice bed mobility; Nustep at end focusing on speed; Try cybex machines for BLE strengthening; gait training focusing on improving speed    PT Home Exercise Plan  eval: SKTC; 1/2: supine clams with RTB; 1/11: seated march, LAQ, ball squeezes; 1/30 - standing hip flexion; 2/7: standing hip abd with RTB; 2/12: mini squats at counter, sit <> stands with decr UE support    Consulted and Agree with Plan of Care  Patient       Patient will benefit from skilled  therapeutic intervention in order to improve the following deficits and impairments:  Abnormal gait, Decreased activity tolerance, Decreased balance, Decreased endurance, Decreased mobility, Decreased range of motion, Decreased strength, Difficulty walking, Hypomobility, Increased fascial restricitons, Increased muscle spasms, Impaired flexibility, Improper body mechanics, Postural dysfunction, Obesity, Pain  Visit Diagnosis: Chronic bilateral low back pain without sciatica - Plan: PT plan of care cert/re-cert  Chronic pain of right knee - Plan: PT plan of care cert/re-cert  Muscle weakness (generalized) - Plan: PT plan of care cert/re-cert  Difficulty in walking, not elsewhere classified - Plan: PT plan of care cert/re-cert     Problem List Patient Active Problem List   Diagnosis Date Noted  . Other hyperparathyroidism ([place]) 11/25/2016  . Hypercalcemia 08/09/2016  . Osteoporosis 10/08/2013  . Hyperlipemia 11/30/2012  . Arthritis of knee, degenerative 09/23/2012  . Knee bursitis 09/23/2012  . Bacteremia 08/13/2012  . Acute pyelonephritis 08/11/2012  . Dehydration 08/11/2012  . Hyponatremia 08/11/2012  . ARF (acute renal failure) ([place]) 08/11/2012  . Uncontrolled type 2 diabetes mellitus with complication, without long-term current use of insulin ([place]) 08/11/2012  . HTN (hypertension) 08/11/2012  . Hypothyroidism 08/11/2012  . Gait abnormality 04/22/2012  . Ankle fracture,  bimalleolar, closed 12/10/2011  . Skin breakdown 12/10/2011       Geraldine Solar PT, DPT  [facility] [address] [place], [place], [address] Phone: [phone]   Fax:  [phone]  Name: Cynthia Marks MRN: [id number] Date of Birth: 1932/11/22

## 2017-05-01 ENCOUNTER — Encounter (HOSPITAL_COMMUNITY): Payer: Self-pay

## 2017-05-01 ENCOUNTER — Ambulatory Visit (HOSPITAL_COMMUNITY): Payer: Medicare Other

## 2017-05-01 DIAGNOSIS — R262 Difficulty in walking, not elsewhere classified: Secondary | ICD-10-CM | POA: Diagnosis not present

## 2017-05-01 DIAGNOSIS — M25561 Pain in right knee: Secondary | ICD-10-CM

## 2017-05-01 DIAGNOSIS — M6281 Muscle weakness (generalized): Secondary | ICD-10-CM

## 2017-05-01 DIAGNOSIS — G8929 Other chronic pain: Secondary | ICD-10-CM

## 2017-05-01 DIAGNOSIS — M545 Low back pain: Secondary | ICD-10-CM | POA: Diagnosis not present

## 2017-05-01 NOTE — Therapy (Signed)
[place], [place], [address] Phone: [phone]   Fax:  [phone]  Physical Therapy Treatment  Patient Details  Name: Cynthia Marks MRN: [id number] Date of Birth: 1932/05/05 Referring Provider: Arther Abbott, MD   Encounter Date: 05/01/2017  PT End of Session - 05/01/17 1455    Visit Number  14    Number of Visits  24    Date for PT Re-Evaluation  05/27/17    Authorization Type  Medicare (secondary: generic commercial)    Authorization Time Period  03/04/17 to 04/29/17; NEW: 04/29/17 to 05/27/17    Authorization - Visit Number  14    Authorization - Number of Visits  24    PT Start Time  1346    PT Stop Time  0737    PT Time Calculation (min)  51 min    Equipment Utilized During Treatment  Gait belt    Activity Tolerance  Patient tolerated treatment well    Behavior During Therapy  [facility] for tasks assessed/performed       Past Medical History:  Diagnosis Date  . Arthritis    Knees  . Diabetes mellitus   . Hypertension   . Macular degeneration   . Thyroid disease     Past Surgical History:  Procedure Laterality Date  . ABDOMINAL HYSTERECTOMY    . COLONOSCOPY    . SHOULDER SURGERY      There were no vitals filed for this visit.  Subjective Assessment - 05/01/17 1450    Subjective  Pt states she is feeling well today with no pain to speak of. Has not done HEP.    Limitations  Walking    How long can you sit comfortably?  no real issues    How long can you stand comfortably?  getting up from sitting; <5 mins    How long can you walk comfortably?  <5 mins    Patient Stated Goals  be able to walk again    Pain Score  0-No pain    Pain Onset  More than a month ago                      [facility] Adult PT Treatment/Exercise - 05/01/17 0001      Ambulation/Gait   Ambulation Distance (Feet)  225 Feet    Assistive device  Rolling walker increased height by 2 holes    Ambulation  Surface  Level      Knee/Hip Exercises: Stretches   Active Hamstring Stretch  Both;3 reps;30 seconds    Active Hamstring Stretch Limitations  seated, LE on 12" step    Gastroc Stretch  Both;3 reps;30 seconds    Gastroc Stretch Limitations  slant board      Knee/Hip Exercises: Aerobic   Nustep  x5 mins, L3 maintaining SPM >60 SPM, to improve gait speed and sequencing move to goal of 70 SPM next visit      Knee/Hip Exercises: Standing   Heel Raises  Both;10 reps    Heel Raises Limitations  heel and toe    Hip Flexion  1 set;10 reps;Stengthening;Both    Hip Abduction  Stengthening;Both;2 sets;15 reps    Abduction Limitations  RTB    Hip Extension  Stengthening;Both;2 sets;5 sets;Knee straight    Extension Limitations  RTB    Stairs  small riser 1RT; rail on L up; rail rt down; 2 UE down with rt leg  Knee/Hip Exercises: Seated   Sit to Sand  2 sets;5 reps;with UE support from regular chair height             PT Education - 05/01/17 1454    Education provided  Yes    Education Details  Importance of HEP for improvement in strength and function, walking laps in home, overcoming fear of stairs and falling    Person(s) Educated  Patient    Methods  Explanation    Comprehension  Verbalized understanding       PT Short Term Goals - 04/29/17 1103      PT SHORT TERM GOAL #1   Title  Pt will be independent with HEP and perform consistently in order to maximize overall function.    Baseline  2/12: pt states that she does not perform exercises everyday or as instructed    Time  4    Period  Weeks    Status  On-going      PT SHORT TERM GOAL #2   Title  Pt will have 1/2 grade improvement OR > in all MMT tested in order to decrease pain, maximize gait, and maximize balance in order to promote function at home and in the community.    Baseline  2/12: see MMT    Time  4    Period  Weeks    Status  Partially Met      PT SHORT TERM GOAL #3   Title  Pt will have improved 3MWT  by 120f or > with LRAD in order to maximize her function at home and in the community.    Baseline  2/12: 2049fwith RW, was 11828ft eval    Time  4    Period  Weeks    Status  On-going        PT Long Term Goals - 04/29/17 1104      PT LONG TERM GOAL #1   Title  Pt will have at least 50% improvement or > in standing and walking tolerance (at least 10 mins) before needing to rest with 2/10 LBP and BLE pain in order to allow pt to perform household chores with greater ease.     Baseline  2/12: 10 min standing; 10-15 mins, no pain    Time  8    Period  Weeks    Status  Achieved      PT LONG TERM GOAL #2   Title  Pt will have improved 5xSTS to 25 sec or < with 1 to no UE support to demo improved functional BLE strength and decrease risk for falls.    Baseline  2/12: 51 sec and 46 sec; was 38.7 sec, was 44sec on 1/16    Time  8    Period  Weeks    Status  On-going      PT LONG TERM GOAL #3   Title  Pt will be able to perform SLS on BLE for 10 sec or > with 1 to no UE assist in order to maximize gait and decrease risk for falls.     Baseline  2/12: 15 sec bil SLS with 1 UE support on RW    Time  8    Period  Weeks    Status  Achieved      PT LONG TERM GOAL #4   Title  Pt will be able to perform the TUG in 30 sec or < with LRAD in order to demo improved balance, decrease  risk for falls, and maximize community access.     Baseline  2/12: 1:05 with RW (was 1:06 with RW on 1/16)    Time  8    Period  Weeks    Status  On-going      PT LONG TERM GOAL #5   Title  Pt will have improved lumbar ROM by 25% or > throughout in order to decrease pain and allow pt to perform ADLs with greater ease.     Time  8    Period  Weeks    Status  On-going            Plan - 05/01/17 1455    Clinical Impression Statement  Pt exhibited increased tolerance for upright activities with only one exercises (NuStep) performed in seated position, all others standing. Patient raised rolling walker by 2  holes for much improved posture during gait with patient report of feeling much better. PT cued patient to walk "in" walker not "behind." Attempted small riser stairs today. Patient required min guard and 1-2 UE assist on 1-2 rails, especially when stepping down with Rt leg. Obvious fear of falling involved in her performance. Patient declined trying a 2nd round through on stairs today but said she would try again next session. Patient will continue to benefit from skilled physical therapy to increase her functional abilities, endurance, strength and balance to nearer PLOF as pain is much improved.     Rehab Potential  Fair    PT Frequency  2x / week    PT Duration  4 weeks    PT Treatment/Interventions  ADLs/Self Care Home Management;Cryotherapy;Electrical Stimulation;Moist Heat;Traction;DME Instruction;Gait training;Stair training;Functional mobility training;Therapeutic activities;Therapeutic exercise;Balance training;Patient/family education;Manual techniques;Passive range of motion;Dry needling;Energy conservation;Taping    PT Next Visit Plan  Continue to progress functional strengthening, balance and gait training; small riser stair practice; practice bed mobility; Nustep at end focusing on speed; Try cybex machines for BLE strengthening; gait training focusing on improving speed    PT Home Exercise Plan  eval: SKTC; 1/2: supine clams with RTB; 1/11: seated march, LAQ, ball squeezes; 1/30 - standing hip flexion; 2/7: standing hip abd with RTB; 2/12: mini squats at counter, sit <> stands with decr UE support    Consulted and Agree with Plan of Care  Patient       Patient will benefit from skilled therapeutic intervention in order to improve the following deficits and impairments:  Abnormal gait, Decreased activity tolerance, Decreased balance, Decreased endurance, Decreased mobility, Decreased range of motion, Decreased strength, Difficulty walking, Hypomobility, Increased fascial restricitons,  Increased muscle spasms, Impaired flexibility, Improper body mechanics, Postural dysfunction, Obesity, Pain  Visit Diagnosis: Chronic bilateral low back pain without sciatica  Chronic pain of right knee  Muscle weakness (generalized)  Difficulty in walking, not elsewhere classified     Problem List Patient Active Problem List   Diagnosis Date Noted  . Other hyperparathyroidism ([place]) 11/25/2016  . Hypercalcemia 08/09/2016  . Osteoporosis 10/08/2013  . Hyperlipemia 11/30/2012  . Arthritis of knee, degenerative 09/23/2012  . Knee bursitis 09/23/2012  . Bacteremia 08/13/2012  . Acute pyelonephritis 08/11/2012  . Dehydration 08/11/2012  . Hyponatremia 08/11/2012  . ARF (acute renal failure) ([place]) 08/11/2012  . Uncontrolled type 2 diabetes mellitus with complication, without long-term current use of insulin ([place]) 08/11/2012  . HTN (hypertension) 08/11/2012  . Hypothyroidism 08/11/2012  . Gait abnormality 04/22/2012  . Ankle fracture, bimalleolar, closed 12/10/2011  . Skin breakdown 12/10/2011    Floria Raveling. Hartnett-Rands,  MS, PT [facility] [place] #[id number] 05/01/2017, 3:02 PM  [place] [address] [place], [place], [address] Phone: [phone]   Fax:  [phone]  Name: Cynthia Marks MRN: [id number] Date of Birth: 1933/01/21

## 2017-05-07 ENCOUNTER — Telehealth (HOSPITAL_COMMUNITY): Payer: Self-pay | Admitting: Family Medicine

## 2017-05-07 ENCOUNTER — Ambulatory Visit (HOSPITAL_COMMUNITY): Payer: Medicare Other

## 2017-05-07 NOTE — Telephone Encounter (Signed)
05/07/17  pt called to cx but no reason was given

## 2017-05-09 ENCOUNTER — Ambulatory Visit (HOSPITAL_COMMUNITY): Payer: Medicare Other

## 2017-05-09 DIAGNOSIS — M25561 Pain in right knee: Secondary | ICD-10-CM

## 2017-05-09 DIAGNOSIS — R262 Difficulty in walking, not elsewhere classified: Secondary | ICD-10-CM

## 2017-05-09 DIAGNOSIS — M545 Low back pain, unspecified: Secondary | ICD-10-CM

## 2017-05-09 DIAGNOSIS — G8929 Other chronic pain: Secondary | ICD-10-CM

## 2017-05-09 DIAGNOSIS — M6281 Muscle weakness (generalized): Secondary | ICD-10-CM | POA: Diagnosis not present

## 2017-05-09 NOTE — Therapy (Signed)
[place], [place], [address] Phone: [phone]   Fax:  [phone]  Physical Therapy Treatment  Patient Details  Name: Cynthia Marks MRN: [id number] Date of Birth: 08-28-1932 Referring Provider: Arther Abbott, MD   Encounter Date: 05/09/2017  PT End of Session - 05/09/17 1343    Visit Number  15    Number of Visits  24    Date for PT Re-Evaluation  05/27/17    Authorization Type  Medicare (secondary: generic commercial)    Authorization Time Period  03/04/17 to 04/29/17; NEW: 04/29/17 to 05/27/17    Authorization - Visit Number  15    Authorization - Number of Visits  24    PT Start Time  6962    PT Stop Time  1350    PT Time Calculation (min)  43 min    Activity Tolerance  Patient tolerated treatment well;Patient limited by fatigue    Behavior During Therapy  [facility] for tasks assessed/performed       Past Medical History:  Diagnosis Date  . Arthritis    Knees  . Diabetes mellitus   . Hypertension   . Macular degeneration   . Thyroid disease     Past Surgical History:  Procedure Laterality Date  . ABDOMINAL HYSTERECTOMY    . COLONOSCOPY    . SHOULDER SURGERY      There were no vitals filed for this visit.  Subjective Assessment - 05/09/17 1313    Subjective  Pt reports increased pain with rainy weather, back adn knees pretty bad. Pt also reports some new Left posterior hip pain which she attibutes quesitonably to working on STS transfers at from from a chair that is too low.     Currently in Pain?  Yes    Pain Score  5     Pain Location  -- bilat knees, low back, Left hip                      [facility] Adult PT Treatment/Exercise - 05/09/17 0001      Ambulation/Gait   Ambulation Distance (Feet)  -- trasnisiton AMB in session    Assistive device  Rolling walker    Gait velocity  0.12ms      Knee/Hip Exercises: Aerobic   Nustep  x5 mins, L3 maintaining SPM >60 SPM, to  improve gait speed and sequencing 70+ SPM maintained; towel roll @T6 -8 level to promote Tx ext      Knee/Hip Exercises: Seated   Long Arc Quad  4 sets;10 reps;Weights 23" high surface, RW to avoid diskinesis.     Long Arc [employer]Sonic AutomotiveWeight  -- 4lb Right, 5lb Left     Knee/Hip Flexion  marching 4x10 bilat bil  2lb on Rt; 3lb on Lt    Other Seated Knee/Hip Exercises  Ankle PF: 3x20, 10lb on knee  Seated, forfoot elevated to allow DF start, weight on knees    Sit to Sand  3 sets;10 reps;with UE support elevated seat surface                PT Short Term Goals - 04/29/17 1103      PT SHORT TERM GOAL #1   Title  Pt will be independent with HEP and perform consistently in order to maximize overall function.    Baseline  2/12: pt states that she does not perform exercises everyday or as instructed    Time  4  Period  Weeks    Status  On-going      PT SHORT TERM GOAL #2   Title  Pt will have 1/2 grade improvement OR > in all MMT tested in order to decrease pain, maximize gait, and maximize balance in order to promote function at home and in the community.    Baseline  2/12: see MMT    Time  4    Period  Weeks    Status  Partially Met      PT SHORT TERM GOAL #3   Title  Pt will have improved 3MWT by 17f or > with LRAD in order to maximize her function at home and in the community.    Baseline  2/12: 2033fwith RW, was 11826ft eval    Time  4    Period  Weeks    Status  On-going        PT Long Term Goals - 04/29/17 1104      PT LONG TERM GOAL #1   Title  Pt will have at least 50% improvement or > in standing and walking tolerance (at least 10 mins) before needing to rest with 2/10 LBP and BLE pain in order to allow pt to perform household chores with greater ease.     Baseline  2/12: 10 min standing; 10-15 mins, no pain    Time  8    Period  Weeks    Status  Achieved      PT LONG TERM GOAL #2   Title  Pt will have improved 5xSTS to 25 sec or < with 1 to no UE support to  demo improved functional BLE strength and decrease risk for falls.    Baseline  2/12: 51 sec and 46 sec; was 38.7 sec, was 44sec on 1/16    Time  8    Period  Weeks    Status  On-going      PT LONG TERM GOAL #3   Title  Pt will be able to perform SLS on BLE for 10 sec or > with 1 to no UE assist in order to maximize gait and decrease risk for falls.     Baseline  2/12: 15 sec bil SLS with 1 UE support on RW    Time  8    Period  Weeks    Status  Achieved      PT LONG TERM GOAL #4   Title  Pt will be able to perform the TUG in 30 sec or < with LRAD in order to demo improved balance, decrease risk for falls, and maximize community access.     Baseline  2/12: 1:05 with RW (was 1:06 with RW on 1/16)    Time  8    Period  Weeks    Status  On-going      PT LONG TERM GOAL #5   Title  Pt will have improved lumbar ROM by 25% or > throughout in order to decrease pain and allow pt to perform ADLs with greater ease.     Time  8    Period  Weeks    Status  On-going            Plan - 05/09/17 1344    Clinical Impression Statement  Pt progressing well. Transisiton this session to more focal isolated strengthening d/t  plateau of strength improvements noted at reassessment and potential for increased physiological responsiveness d/t motor variability. MinA given at times throughout to  assure full ROM with new increased weight. Pt resting periodically but overall good tolerance.,no noted perspiration or DOE. Noted poor TKE strength is limiting, more rectus dominant and lacking VMO activation,, also with loss of end range ROM conssiten twith degenerative OA: may benefit from joint mobilization and TENS n future to addres these impairment.     PT Frequency  2x / week    PT Duration  4 weeks    PT Treatment/Interventions  ADLs/Self Care Home Management;Cryotherapy;Electrical Stimulation;Moist Heat;Traction;DME Instruction;Gait training;Stair training;Functional mobility training;Therapeutic  activities;Therapeutic exercise;Balance training;Patient/family education;Manual techniques;Passive range of motion;Dry needling;Energy conservation;Taping    PT Next Visit Plan  Continue to progress functional strengthening, balance and gait training; small riser stair practice; practice bed mobility; Nustep at end focusing on speed; Try cybex machines for BLE strengthening; gait training focusing on improving speed    PT Home Exercise Plan  eval: SKTC; 1/2: supine clams with RTB; 1/11: seated march, LAQ, ball squeezes; 1/30 - standing hip flexion; 2/7: standing hip abd with RTB; 2/12: mini squats at counter, sit <> stands with decr UE support    Consulted and Agree with Plan of Care  Patient       Patient will benefit from skilled therapeutic intervention in order to improve the following deficits and impairments:  Abnormal gait, Decreased activity tolerance, Decreased balance, Decreased endurance, Decreased mobility, Decreased range of motion, Decreased strength, Difficulty walking, Hypomobility, Increased fascial restricitons, Increased muscle spasms, Impaired flexibility, Improper body mechanics, Postural dysfunction, Obesity, Pain  Visit Diagnosis: Chronic bilateral low back pain without sciatica  Chronic pain of right knee  Muscle weakness (generalized)  Difficulty in walking, not elsewhere classified     Problem List Patient Active Problem List   Diagnosis Date Noted  . Other hyperparathyroidism ([place]) 11/25/2016  . Hypercalcemia 08/09/2016  . Osteoporosis 10/08/2013  . Hyperlipemia 11/30/2012  . Arthritis of knee, degenerative 09/23/2012  . Knee bursitis 09/23/2012  . Bacteremia 08/13/2012  . Acute pyelonephritis 08/11/2012  . Dehydration 08/11/2012  . Hyponatremia 08/11/2012  . ARF (acute renal failure) ([place]) 08/11/2012  . Uncontrolled type 2 diabetes mellitus with complication, without long-term current use of insulin ([place]) 08/11/2012  . HTN (hypertension) 08/11/2012  .  Hypothyroidism 08/11/2012  . Gait abnormality 04/22/2012  . Ankle fracture, bimalleolar, closed 12/10/2011  . Skin breakdown 12/10/2011    1:53 PM, 05/09/17 Etta Grandchild, PT, DPT Physical Therapist at [place] [phone] (office)     Etta Grandchild 05/09/2017, 1:52 PM  [place] [address] [place], [place], [address] Phone: [phone]   Fax:  [phone]  Name: Cynthia Marks MRN: [id number] Date of Birth: 02-14-1933

## 2017-05-13 ENCOUNTER — Ambulatory Visit (HOSPITAL_COMMUNITY): Payer: Medicare Other

## 2017-05-13 ENCOUNTER — Encounter (HOSPITAL_COMMUNITY): Payer: Self-pay

## 2017-05-13 DIAGNOSIS — M545 Low back pain, unspecified: Secondary | ICD-10-CM

## 2017-05-13 DIAGNOSIS — M25561 Pain in right knee: Secondary | ICD-10-CM

## 2017-05-13 DIAGNOSIS — R262 Difficulty in walking, not elsewhere classified: Secondary | ICD-10-CM | POA: Diagnosis not present

## 2017-05-13 DIAGNOSIS — M6281 Muscle weakness (generalized): Secondary | ICD-10-CM

## 2017-05-13 DIAGNOSIS — G8929 Other chronic pain: Secondary | ICD-10-CM | POA: Diagnosis not present

## 2017-05-13 NOTE — Therapy (Signed)
[place], [place], [address] Phone: [phone]   Fax:  [phone]  Physical Therapy Treatment  Patient Details  Name: Cynthia Marks MRN: [id number] Date of Birth: 08-29-1932 Referring Provider: Arther Abbott, MD   Encounter Date: 05/13/2017  PT End of Session - 05/13/17 1114    Visit Number  16    Number of Visits  24    Date for PT Re-Evaluation  05/27/17    Authorization Type  Medicare (secondary: generic commercial)    Authorization Time Period  03/04/17 to 04/29/17; NEW: 04/29/17 to 05/27/17    Authorization - Visit Number  16    Authorization - Number of Visits  24    PT Start Time  1111    PT Stop Time  1200    PT Time Calculation (min)  49 min    Activity Tolerance  Patient tolerated treatment well;Patient limited by fatigue    Behavior During Therapy  [facility] for tasks assessed/performed       Past Medical History:  Diagnosis Date  . Arthritis    Knees  . Diabetes mellitus   . Hypertension   . Macular degeneration   . Thyroid disease     Past Surgical History:  Procedure Laterality Date  . ABDOMINAL HYSTERECTOMY    . COLONOSCOPY    . SHOULDER SURGERY      There were no vitals filed for this visit.  Subjective Assessment - 05/13/17 1114    Subjective  Pt states that she had a rough weekend due to the rainy weather. She also states that she was sore following her session Friday. She is feeling better today.     Currently in Pain?  Yes    Pain Score  4     Pain Location  Knee    Pain Orientation  Right    Pain Descriptors / Indicators  Sore    Pain Type  Chronic pain    Pain Onset  More than a month ago    Pain Frequency  Intermittent    Aggravating Factors   walking, standing, sitting without support    Pain Relieving Factors  sitting with back support, heating pad, laying in bed    Effect of Pain on Daily Activities  can't do things like she used to          [facility] Adult PT  Treatment/Exercise - 05/13/17 0001      Knee/Hip Exercises: Stretches   Active Hamstring Stretch  Both;2 reps;30 seconds    Active Hamstring Stretch Limitations  seated      Knee/Hip Exercises: Aerobic   Nustep  x5 mins, L4, >65 SPM to improve gait speed and sequencing       Knee/Hip Exercises: Machines for Strengthening   Cybex Knee Extension  1 plate, BLE, 2x10    Cybex Knee Flexion  3 plates, BLE, 2x10      Knee/Hip Exercises: Standing   Terminal Knee Extension  Both;10 reps    Theraband Level (Terminal Knee Extension)  Level 2 (Red)    Terminal Knee Extension Limitations  10" holds      Knee/Hip Exercises: Seated   Long Arc Quad  Both;2 sets;15 reps    Long Arc Quad Limitations  4# R, 5# L    Knee/Hip Flexion  marching 2x15 reps (4# R, 5# L), 2-3" holds    Sit to [employer]  10 reps;with UE support 22" mat height  PT Education - 05/13/17 1116    Education provided  Yes    Education Details  exercise technique    Person(s) Educated  Patient    Methods  Explanation;Demonstration    Comprehension  Verbalized understanding;Returned demonstration       PT Short Term Goals - 04/29/17 1103      PT SHORT TERM GOAL #1   Title  Pt will be independent with HEP and perform consistently in order to maximize overall function.    Baseline  2/12: pt states that she does not perform exercises everyday or as instructed    Time  4    Period  Weeks    Status  On-going      PT SHORT TERM GOAL #2   Title  Pt will have 1/2 grade improvement OR > in all MMT tested in order to decrease pain, maximize gait, and maximize balance in order to promote function at home and in the community.    Baseline  2/12: see MMT    Time  4    Period  Weeks    Status  Partially Met      PT SHORT TERM GOAL #3   Title  Pt will have improved 3MWT by 11f or > with LRAD in order to maximize her function at home and in the community.    Baseline  2/12: 2037fwith RW, was 11872ft eval    Time  4     Period  Weeks    Status  On-going        PT Long Term Goals - 04/29/17 1104      PT LONG TERM GOAL #1   Title  Pt will have at least 50% improvement or > in standing and walking tolerance (at least 10 mins) before needing to rest with 2/10 LBP and BLE pain in order to allow pt to perform household chores with greater ease.     Baseline  2/12: 10 min standing; 10-15 mins, no pain    Time  8    Period  Weeks    Status  Achieved      PT LONG TERM GOAL #2   Title  Pt will have improved 5xSTS to 25 sec or < with 1 to no UE support to demo improved functional BLE strength and decrease risk for falls.    Baseline  2/12: 51 sec and 46 sec; was 38.7 sec, was 44sec on 1/16    Time  8    Period  Weeks    Status  On-going      PT LONG TERM GOAL #3   Title  Pt will be able to perform SLS on BLE for 10 sec or > with 1 to no UE assist in order to maximize gait and decrease risk for falls.     Baseline  2/12: 15 sec bil SLS with 1 UE support on RW    Time  8    Period  Weeks    Status  Achieved      PT LONG TERM GOAL #4   Title  Pt will be able to perform the TUG in 30 sec or < with LRAD in order to demo improved balance, decrease risk for falls, and maximize community access.     Baseline  2/12: 1:05 with RW (was 1:06 with RW on 1/16)    Time  8    Period  Weeks    Status  On-going  PT LONG TERM GOAL #5   Title  Pt will have improved lumbar ROM by 25% or > throughout in order to decrease pain and allow pt to perform ADLs with greater ease.     Time  8    Period  Weeks    Status  On-going            Plan - 05/13/17 1154    Clinical Impression Statement  Today's session continued to focus on gluteal, quad, HS, and functional strengthening of BLE. Pt with continued deficits throughout and with continued difficulty performing STS without BUE support. Added cybex machines for quad and HS strengthening with good tolerance, just c/o difficulty with exercise. Ended with nustep  (progressed to L5 and maintaining SPC 65 or >) for continued emphasis on improved gait sequencing, pattern, and speed; this provided increased challenge for pt.     PT Frequency  2x / week    PT Duration  4 weeks    PT Treatment/Interventions  ADLs/Self Care Home Management;Cryotherapy;Electrical Stimulation;Moist Heat;Traction;DME Instruction;Gait training;Stair training;Functional mobility training;Therapeutic activities;Therapeutic exercise;Balance training;Patient/family education;Manual techniques;Passive range of motion;Dry needling;Energy conservation;Taping    PT Next Visit Plan  Continue to progress functional strengthening, balance and gait training; small riser stair practice; Nustep at end focusing on speed; cybex machines for BLE strengthening; gait training focusing on improving speed    PT Home Exercise Plan  eval: SKTC; 1/2: supine clams with RTB; 1/11: seated march, LAQ, ball squeezes; 1/30 - standing hip flexion; 2/7: standing hip abd with RTB; 2/12: mini squats at counter, sit <> stands with decr UE support    Consulted and Agree with Plan of Care  Patient       Patient will benefit from skilled therapeutic intervention in order to improve the following deficits and impairments:  Abnormal gait, Decreased activity tolerance, Decreased balance, Decreased endurance, Decreased mobility, Decreased range of motion, Decreased strength, Difficulty walking, Hypomobility, Increased fascial restricitons, Increased muscle spasms, Impaired flexibility, Improper body mechanics, Postural dysfunction, Obesity, Pain  Visit Diagnosis: Chronic bilateral low back pain without sciatica  Chronic pain of right knee  Muscle weakness (generalized)  Difficulty in walking, not elsewhere classified     Problem List Patient Active Problem List   Diagnosis Date Noted  . Other hyperparathyroidism ([place]) 11/25/2016  . Hypercalcemia 08/09/2016  . Osteoporosis 10/08/2013  . Hyperlipemia 11/30/2012  .  Arthritis of knee, degenerative 09/23/2012  . Knee bursitis 09/23/2012  . Bacteremia 08/13/2012  . Acute pyelonephritis 08/11/2012  . Dehydration 08/11/2012  . Hyponatremia 08/11/2012  . ARF (acute renal failure) ([place]) 08/11/2012  . Uncontrolled type 2 diabetes mellitus with complication, without long-term current use of insulin ([place]) 08/11/2012  . HTN (hypertension) 08/11/2012  . Hypothyroidism 08/11/2012  . Gait abnormality 04/22/2012  . Ankle fracture, bimalleolar, closed 12/10/2011  . Skin breakdown 12/10/2011       Geraldine Solar PT, DPT  [facility] [address] [place], [place], [address] Phone: [phone]   Fax:  [phone]  Name: Cynthia Marks MRN: [id number] Date of Birth: [birth year]

## 2017-05-15 ENCOUNTER — Encounter (HOSPITAL_COMMUNITY): Payer: Self-pay

## 2017-05-15 ENCOUNTER — Ambulatory Visit (HOSPITAL_COMMUNITY): Payer: Medicare Other

## 2017-05-15 DIAGNOSIS — G8929 Other chronic pain: Secondary | ICD-10-CM

## 2017-05-15 DIAGNOSIS — M6281 Muscle weakness (generalized): Secondary | ICD-10-CM | POA: Diagnosis not present

## 2017-05-15 DIAGNOSIS — R262 Difficulty in walking, not elsewhere classified: Secondary | ICD-10-CM

## 2017-05-15 DIAGNOSIS — M545 Low back pain, unspecified: Secondary | ICD-10-CM

## 2017-05-15 DIAGNOSIS — M25561 Pain in right knee: Secondary | ICD-10-CM

## 2017-05-15 NOTE — Therapy (Signed)
[place], [place], [address] Phone: [phone]   Fax:  [phone]  Physical Therapy Treatment  Patient Details  Name: Cynthia Marks MRN: [id number] Date of Birth: 1932/11/11 Referring Provider: Arther Abbott, MD   Encounter Date: 05/15/2017  PT End of Session - 05/15/17 1115    Visit Number  17    Number of Visits  24    Date for PT Re-Evaluation  05/27/17    Authorization Type  Medicare (secondary: generic commercial)    Authorization Time Period  03/04/17 to 04/29/17; NEW: 04/29/17 to 05/27/17    Authorization - Visit Number  17    Authorization - Number of Visits  24    PT Start Time  3235    PT Stop Time  5732 last 5 min on Nustep. no charge    PT Time Calculation (min)  53 min    Equipment Utilized During Treatment  Gait belt    Activity Tolerance  Patient tolerated treatment well;Patient limited by fatigue    Behavior During Therapy  WFL for tasks assessed/performed       Past Medical History:  Diagnosis Date  . Arthritis    Knees  . Diabetes mellitus   . Hypertension   . Macular degeneration   . Thyroid disease     Past Surgical History:  Procedure Laterality Date  . ABDOMINAL HYSTERECTOMY    . COLONOSCOPY    . SHOULDER SURGERY      There were no vitals filed for this visit.  Subjective Assessment - 05/15/17 1114    Subjective  Pt stated no reports of pain this morning, feels like she is moving slow.  Stated she has been sore following exercises last session.      Patient Stated Goals  be able to walk again    Currently in Pain?  No/denies                      [facility] Adult PT Treatment/Exercise - 05/15/17 0001      Ambulation/Gait   Ambulation/Gait  Yes    Ambulation Distance (Feet)  226 Feet    Assistive device  Rolling walker    Gait Pattern  Decreased stride length    Ambulation Surface  Level    Gait Comments  no increases in pain      Knee/Hip  Exercises: Stretches   Active Hamstring Stretch  Both;2 reps;30 seconds    Active Hamstring Stretch Limitations  seated 12in step reaching for RW      Knee/Hip Exercises: Aerobic   Nustep  x5 mins, L4, >65 SPM to improve gait speed and sequencing       Knee/Hip Exercises: Machines for Strengthening   Cybex Knee Extension  1 plate, BLE, 2x10    Cybex Knee Flexion  3 plates, BLE, 2x10      Knee/Hip Exercises: Standing   Terminal Knee Extension  Both;10 reps    Theraband Level (Terminal Knee Extension)  Level 2 (Red)    Terminal Knee Extension Limitations  10" holds    Forward Step Up  Both;2 sets;10 reps;Hand Hold: 2;Step Height: 4"    Forward Step Up Limitations  1 UE assist with LLE, BUE assist with RLE      Knee/Hip Exercises: Seated   Knee/Hip Flexion  marching 15 reps (5# BLE), 2-3" holds sitting on dynadisc    Sit to Sand  10 reps;with UE support 22 in mat height  PT Short Term Goals - 04/29/17 1103      PT SHORT TERM GOAL #1   Title  Pt will be independent with HEP and perform consistently in order to maximize overall function.    Baseline  2/12: pt states that she does not perform exercises everyday or as instructed    Time  4    Period  Weeks    Status  On-going      PT SHORT TERM GOAL #2   Title  Pt will have 1/2 grade improvement OR > in all MMT tested in order to decrease pain, maximize gait, and maximize balance in order to promote function at home and in the community.    Baseline  2/12: see MMT    Time  4    Period  Weeks    Status  Partially Met      PT SHORT TERM GOAL #3   Title  Pt will have improved 3MWT by 129f or > with LRAD in order to maximize her function at home and in the community.    Baseline  2/12: 2052fwith RW, was 11872ft eval    Time  4    Period  Weeks    Status  On-going        PT Long Term Goals - 04/29/17 1104      PT LONG TERM GOAL #1   Title  Pt will have at least 50% improvement or > in standing and  walking tolerance (at least 10 mins) before needing to rest with 2/10 LBP and BLE pain in order to allow pt to perform household chores with greater ease.     Baseline  2/12: 10 min standing; 10-15 mins, no pain    Time  8    Period  Weeks    Status  Achieved      PT LONG TERM GOAL #2   Title  Pt will have improved 5xSTS to 25 sec or < with 1 to no UE support to demo improved functional BLE strength and decrease risk for falls.    Baseline  2/12: 51 sec and 46 sec; was 38.7 sec, was 44sec on 1/16    Time  8    Period  Weeks    Status  On-going      PT LONG TERM GOAL #3   Title  Pt will be able to perform SLS on BLE for 10 sec or > with 1 to no UE assist in order to maximize gait and decrease risk for falls.     Baseline  2/12: 15 sec bil SLS with 1 UE support on RW    Time  8    Period  Weeks    Status  Achieved      PT LONG TERM GOAL #4   Title  Pt will be able to perform the TUG in 30 sec or < with LRAD in order to demo improved balance, decrease risk for falls, and maximize community access.     Baseline  2/12: 1:05 with RW (was 1:06 with RW on 1/16)    Time  8    Period  Weeks    Status  On-going      PT LONG TERM GOAL #5   Title  Pt will have improved lumbar ROM by 25% or > throughout in order to decrease pain and allow pt to perform ADLs with greater ease.     Time  8    Period  Weeks  Status  On-going            Plan - 05/15/17 1206    Clinical Impression Statement  Continued session focus with gluteal, quad, hs and functional strengthening for BLE.  Pt continues to demonstrate weakness with functional activities requiring seated rest breaks due to fatigue and HHA required for assistance wiht stair training.  Added dynadisc with seated marching to improve core strengthening to assist with balance during gait.  No reports of pain, was limited by fatigue.      Rehab Potential  Fair    PT Frequency  2x / week    PT Duration  4 weeks    PT Treatment/Interventions   ADLs/Self Care Home Management;Cryotherapy;Electrical Stimulation;Moist Heat;Traction;DME Instruction;Gait training;Stair training;Functional mobility training;Therapeutic activities;Therapeutic exercise;Balance training;Patient/family education;Manual techniques;Passive range of motion;Dry needling;Energy conservation;Taping    PT Next Visit Plan  Continue to progress functional strengthening, balance and gait training; small riser stair practice; Nustep at end focusing on speed; cybex machines for BLE strengthening; gait training focusing on improving speed    PT Home Exercise Plan  eval: SKTC; 1/2: supine clams with RTB; 1/11: seated march, LAQ, ball squeezes; 1/30 - standing hip flexion; 2/7: standing hip abd with RTB; 2/12: mini squats at counter, sit <> stands with decr UE support       Patient will benefit from skilled therapeutic intervention in order to improve the following deficits and impairments:  Abnormal gait, Decreased activity tolerance, Decreased balance, Decreased endurance, Decreased mobility, Decreased range of motion, Decreased strength, Difficulty walking, Hypomobility, Increased fascial restricitons, Increased muscle spasms, Impaired flexibility, Improper body mechanics, Postural dysfunction, Obesity, Pain  Visit Diagnosis: Chronic bilateral low back pain without sciatica  Chronic pain of right knee  Muscle weakness (generalized)  Difficulty in walking, not elsewhere classified     Problem List Patient Active Problem List   Diagnosis Date Noted  . Other hyperparathyroidism ([place]) 11/25/2016  . Hypercalcemia 08/09/2016  . Osteoporosis 10/08/2013  . Hyperlipemia 11/30/2012  . Arthritis of knee, degenerative 09/23/2012  . Knee bursitis 09/23/2012  . Bacteremia 08/13/2012  . Acute pyelonephritis 08/11/2012  . Dehydration 08/11/2012  . Hyponatremia 08/11/2012  . ARF (acute renal failure) ([place]) 08/11/2012  . Uncontrolled type 2 diabetes mellitus with complication,  without long-term current use of insulin ([place]) 08/11/2012  . HTN (hypertension) 08/11/2012  . Hypothyroidism 08/11/2012  . Gait abnormality 04/22/2012  . Ankle fracture, bimalleolar, closed 12/10/2011  . Skin breakdown 12/10/2011   Ihor Austin, [place]; [place]  Aldona Lento 05/15/2017, 12:35 PM  [facility] [address] [place], [place], [address] Phone: [phone]   Fax:  [phone]  Name: Cynthia Marks MRN: [id number] Date of Birth: January 14, 1933

## 2017-05-20 ENCOUNTER — Ambulatory Visit (HOSPITAL_COMMUNITY): Payer: Medicare Other | Attending: Orthopedic Surgery | Admitting: Physical Therapy

## 2017-05-20 DIAGNOSIS — G8929 Other chronic pain: Secondary | ICD-10-CM | POA: Diagnosis not present

## 2017-05-20 DIAGNOSIS — M25561 Pain in right knee: Secondary | ICD-10-CM | POA: Insufficient documentation

## 2017-05-20 DIAGNOSIS — R262 Difficulty in walking, not elsewhere classified: Secondary | ICD-10-CM | POA: Insufficient documentation

## 2017-05-20 DIAGNOSIS — M6281 Muscle weakness (generalized): Secondary | ICD-10-CM | POA: Diagnosis not present

## 2017-05-20 DIAGNOSIS — M545 Low back pain: Secondary | ICD-10-CM | POA: Diagnosis not present

## 2017-05-20 NOTE — Therapy (Signed)
[place], [place], [address] Phone: [phone]   Fax:  [phone]  Physical Therapy Treatment  Patient Details  Name: Cynthia Marks MRN: [id number] Date of Birth: Dec 03, 1932 Referring Provider: Arther Abbott, MD   Encounter Date: 05/20/2017  PT End of Session - 05/20/17 1205    Visit Number  18    Number of Visits  24    Date for PT Re-Evaluation  05/27/17    Authorization Type  Medicare (secondary: generic commercial)    Authorization Time Period  03/04/17 to 04/29/17; NEW: 04/29/17 to 05/27/17    Authorization - Visit Number  18    Authorization - Number of Visits  24    PT Start Time  1120    PT Stop Time  1203    PT Time Calculation (min)  43 min    Equipment Utilized During Treatment  Gait belt    Activity Tolerance  Patient tolerated treatment well;Patient limited by fatigue    Behavior During Therapy  [facility] for tasks assessed/performed       Past Medical History:  Diagnosis Date  . Arthritis    Knees  . Diabetes mellitus   . Hypertension   . Macular degeneration   . Thyroid disease     Past Surgical History:  Procedure Laterality Date  . ABDOMINAL HYSTERECTOMY    . COLONOSCOPY    . SHOULDER SURGERY      There were no vitals filed for this visit.  Subjective Assessment - 05/20/17 1129    Subjective  Pt states her knees continue to hurt her.  Currently 5/10 Rt knee..      Currently in Pain?  Yes    Pain Score  5     Pain Location  Knee    Pain Orientation  Right                      [facility] Adult PT Treatment/Exercise - 05/20/17 0001      Ambulation/Gait   Ambulation/Gait  Yes    Ambulation Distance (Feet)  226 Feet    Assistive device  Rolling walker    Gait Pattern  Decreased stride length    Ambulation Surface  Level    Gait Comments  no increases in pain      Knee/Hip Exercises: Stretches   Active Hamstring Stretch  2 reps;60 seconds    Active Hamstring  Stretch Limitations  seated 12in step reaching for RW      Knee/Hip Exercises: Aerobic   Nustep  -- pt declined today      Knee/Hip Exercises: Machines for Strengthening   Cybex Knee Extension  1 plate, Rt LE/ Lt LE, 2x10 each      Knee/Hip Exercises: Standing   Forward Step Up  Right;5 reps;Left;10 reps;Hand Hold: 1;Hand Hold: 2;Step Height: 4"    Forward Step Up Limitations  1 UE assist with LLE, BUE assist with RLE    Other Standing Knee Exercises  marching, alternating 10 reps 3" holds with Bil UE assist      Knee/Hip Exercises: Seated   Sit to Sand  without UE support;5 reps from chair with airex pad in seat               PT Short Term Goals - 04/29/17 1103      PT SHORT TERM GOAL #1   Title  Pt will be independent with HEP and perform consistently in order to  maximize overall function.    Baseline  2/12: pt states that she does not perform exercises everyday or as instructed    Time  4    Period  Weeks    Status  On-going      PT SHORT TERM GOAL #2   Title  Pt will have 1/2 grade improvement OR > in all MMT tested in order to decrease pain, maximize gait, and maximize balance in order to promote function at home and in the community.    Baseline  2/12: see MMT    Time  4    Period  Weeks    Status  Partially Met      PT SHORT TERM GOAL #3   Title  Pt will have improved 3MWT by 171f or > with LRAD in order to maximize her function at home and in the community.    Baseline  2/12: 2053fwith RW, was 11870ft eval    Time  4    Period  Weeks    Status  On-going        PT Long Term Goals - 04/29/17 1104      PT LONG TERM GOAL #1   Title  Pt will have at least 50% improvement or > in standing and walking tolerance (at least 10 mins) before needing to rest with 2/10 LBP and BLE pain in order to allow pt to perform household chores with greater ease.     Baseline  2/12: 10 min standing; 10-15 mins, no pain    Time  8    Period  Weeks    Status  Achieved       PT LONG TERM GOAL #2   Title  Pt will have improved 5xSTS to 25 sec or < with 1 to no UE support to demo improved functional BLE strength and decrease risk for falls.    Baseline  2/12: 51 sec and 46 sec; was 38.7 sec, was 44sec on 1/16    Time  8    Period  Weeks    Status  On-going      PT LONG TERM GOAL #3   Title  Pt will be able to perform SLS on BLE for 10 sec or > with 1 to no UE assist in order to maximize gait and decrease risk for falls.     Baseline  2/12: 15 sec bil SLS with 1 UE support on RW    Time  8    Period  Weeks    Status  Achieved      PT LONG TERM GOAL #4   Title  Pt will be able to perform the TUG in 30 sec or < with LRAD in order to demo improved balance, decrease risk for falls, and maximize community access.     Baseline  2/12: 1:05 with RW (was 1:06 with RW on 1/16)    Time  8    Period  Weeks    Status  On-going      PT LONG TERM GOAL #5   Title  Pt will have improved lumbar ROM by 25% or > throughout in order to decrease pain and allow pt to perform ADLs with greater ease.     Time  8    Period  Weeks    Status  On-going            Plan - 05/20/17 1205    Clinical Impression Statement  Increased challenge of sit to  stands today with addition of airex in chair and no use of UE's.  Pt required manual assist to complete with improved posturing as flexes trunk forward to 90 degrees with task.    Added standing march and increased diffciutly of cybex machines to single LE's.  Pt limited by extreme slow mobilty and fatigue but no pain reported.  Conitnued need to use Bil UE's with Rt LE step up and only able to complete 5 reps when fatigued and unable to complete more.  Lt LE step ups completed with 1 UE only.  Pt without increased pain or issues at end of session, only general fatigue and slowl mobiltiy.  Pt requested to "skip" bike today at end of session.     Rehab Potential  Fair    PT Frequency  2x / week    PT Duration  4 weeks    PT  Treatment/Interventions  ADLs/Self Care Home Management;Cryotherapy;Electrical Stimulation;Moist Heat;Traction;DME Instruction;Gait training;Stair training;Functional mobility training;Therapeutic activities;Therapeutic exercise;Balance training;Patient/family education;Manual techniques;Passive range of motion;Dry needling;Energy conservation;Taping    PT Next Visit Plan  Continue to progress functional strengthening, balance and gait training; small riser stair practice; Nustep at end focusing on speed; cybex machines for BLE strengthening; gait training focusing on improving speed.      PT Home Exercise Plan  eval: SKTC; 1/2: supine clams with RTB; 1/11: seated march, LAQ, ball squeezes; 1/30 - standing hip flexion; 2/7: standing hip abd with RTB; 2/12: mini squats at counter, sit <> stands with decr UE support       Patient will benefit from skilled therapeutic intervention in order to improve the following deficits and impairments:  Abnormal gait, Decreased activity tolerance, Decreased balance, Decreased endurance, Decreased mobility, Decreased range of motion, Decreased strength, Difficulty walking, Hypomobility, Increased fascial restricitons, Increased muscle spasms, Impaired flexibility, Improper body mechanics, Postural dysfunction, Obesity, Pain  Visit Diagnosis: Chronic bilateral low back pain without sciatica  Chronic pain of right knee  Muscle weakness (generalized)  Difficulty in walking, not elsewhere classified     Problem List Patient Active Problem List   Diagnosis Date Noted  . Other hyperparathyroidism ([place]) 11/25/2016  . Hypercalcemia 08/09/2016  . Osteoporosis 10/08/2013  . Hyperlipemia 11/30/2012  . Arthritis of knee, degenerative 09/23/2012  . Knee bursitis 09/23/2012  . Bacteremia 08/13/2012  . Acute pyelonephritis 08/11/2012  . Dehydration 08/11/2012  . Hyponatremia 08/11/2012  . ARF (acute renal failure) ([place]) 08/11/2012  . Uncontrolled type 2 diabetes  mellitus with complication, without long-term current use of insulin ([place]) 08/11/2012  . HTN (hypertension) 08/11/2012  . Hypothyroidism 08/11/2012  . Gait abnormality 04/22/2012  . Ankle fracture, bimalleolar, closed 12/10/2011  . Skin breakdown 12/10/2011   Teena Irani, PTA/CLT [phone]  Teena Irani 05/20/2017, 12:13 PM  [place], [place], [address] Phone: [phone]   Fax:  [phone]  Name: Cynthia Marks MRN: [id number] Date of Birth: 10/28/1932

## 2017-05-21 ENCOUNTER — Ambulatory Visit (INDEPENDENT_AMBULATORY_CARE_PROVIDER_SITE_OTHER): Payer: Medicare Other | Admitting: Orthopedic Surgery

## 2017-05-21 ENCOUNTER — Ambulatory Visit (INDEPENDENT_AMBULATORY_CARE_PROVIDER_SITE_OTHER): Payer: Medicare Other

## 2017-05-21 DIAGNOSIS — M5440 Lumbago with sciatica, unspecified side: Secondary | ICD-10-CM

## 2017-05-21 NOTE — Progress Notes (Signed)
Progress Note   Patient ID: Cynthia Marks, female   DOB: 1932-11-22, 84 y.o.   MRN: [id number]    84 year old female followed for osteoarthritis of the knee status post injection and spinal stenosis here for x-rays today related to her back pain  No imaging studies recently of her back.  Those will be done today.  Had physical therapy over the last 3-4 weeks  She now complains of: Continued pain right knee, mild lower back pain and bilateral posterior thigh pain.   Encounter Diagnosis  Name Primary?  . Low back pain with sciatica, sciatica laterality unspecified, unspecified back pain laterality, unspecified chronicity Yes     Review of Systems  Constitutional: Positive for weight loss. Negative for chills and fever.  Musculoskeletal: Positive for back pain and joint pain.  Neurological: Negative for tingling, sensory change and focal weakness.   No outpatient medications have been marked as taking for the 05/21/17 encounter (Office Visit) with Carole Civil, MD.    Allergies  Allergen Reactions  . Aspirin   . Ibuprofen     Nausea or HTN  . Penicillins      Height was 5 foot 8 inches weight was 196 respiratory rate was 20  Physical Exam  Constitutional: She is oriented to person, place, and time. She appears well-developed and well-nourished.  Musculoskeletal:       Right knee: She exhibits decreased range of motion. She exhibits no deformity.       Left knee: She exhibits decreased range of motion. She exhibits no deformity.       Lumbar back: She exhibits decreased range of motion, tenderness and deformity. She exhibits no spasm.  Neurological: She is alert and oriented to person, place, and time. She has normal strength.  Knee extension strength normal hip flexion strength normal right and left leg  Psychiatric: She has a normal mood and affect. Judgment normal.  Vitals reviewed.    Medical decision-making Encounter Diagnosis  Name Primary?  . Low  back pain with sciatica, sciatica laterality unspecified, unspecified back pain laterality, unspecified chronicity Yes   Imaging lumbar spine 3 views ordered  Interpret image: Scoliosis spondylosis severe please see dictated report  Advice is for patient to finish physical therapy and follow-up with [place] if needed At this time she is not a surgical candidate and should maximize her nonoperative 3 treatment     Arther Abbott, MD 05/21/2017 1:59 PM

## 2017-05-22 ENCOUNTER — Other Ambulatory Visit: Payer: Self-pay

## 2017-05-22 ENCOUNTER — Ambulatory Visit (HOSPITAL_COMMUNITY): Payer: Medicare Other

## 2017-05-22 ENCOUNTER — Encounter (HOSPITAL_COMMUNITY): Payer: Self-pay

## 2017-05-22 DIAGNOSIS — M545 Low back pain: Secondary | ICD-10-CM | POA: Diagnosis not present

## 2017-05-22 DIAGNOSIS — M6281 Muscle weakness (generalized): Secondary | ICD-10-CM | POA: Diagnosis not present

## 2017-05-22 DIAGNOSIS — R262 Difficulty in walking, not elsewhere classified: Secondary | ICD-10-CM

## 2017-05-22 DIAGNOSIS — M25561 Pain in right knee: Secondary | ICD-10-CM

## 2017-05-22 DIAGNOSIS — G8929 Other chronic pain: Secondary | ICD-10-CM | POA: Diagnosis not present

## 2017-05-22 NOTE — Therapy (Signed)
[place], [place], [address] Phone: [phone]   Fax:  [phone]  Physical Therapy Treatment  Patient Details  Name: Cynthia Marks MRN: [id number] Date of Birth: Jul 31, 1932 Referring Provider: Arther Abbott, MD   Encounter Date: 05/22/2017  PT End of Session - 05/22/17 1203    Visit Number  19    Number of Visits  24    Date for PT Re-Evaluation  05/27/17    Authorization Type  Medicare (secondary: generic commercial)    Authorization Time Period  03/04/17 to 04/29/17; NEW: 04/29/17 to 05/27/17    Authorization - Visit Number  26    Authorization - Number of Visits  24    PT Start Time  1118    PT Stop Time  1205    PT Time Calculation (min)  47 min    Equipment Utilized During Treatment  Gait belt    Activity Tolerance  Patient tolerated treatment well;Patient limited by fatigue    Behavior During Therapy  [facility] for tasks assessed/performed       Past Medical History:  Diagnosis Date  . Arthritis    Knees  . Diabetes mellitus   . Hypertension   . Macular degeneration   . Thyroid disease     Past Surgical History:  Procedure Laterality Date  . ABDOMINAL HYSTERECTOMY    . COLONOSCOPY    . SHOULDER SURGERY      There were no vitals filed for this visit.  Subjective Assessment - 05/22/17 1203    Subjective  Patient feels good today however continues to have pain in both knees. SHe denies pain in her abck today and states she went to see her doctor yesterday and they stated next time she comes in they will discuss surgical options for her back and knees. She states she would really like to avoid surgery if possible.    Limitations  Walking    Currently in Pain?  Yes    Pain Score  4     Pain Location  Knee    Pain Orientation  Right;Left right greater than left    Pain Descriptors / Indicators  Aching;Sore    Pain Type  Chronic pain    Pain Onset  More than a month ago    Pain Frequency   Constant    Aggravating Factors   walking, standing,     Pain Relieving Factors  resting        [facility] Adult PT Treatment/Exercise - 05/22/17 0001      Ambulation/Gait   Ambulation/Gait  Yes    Ambulation Distance (Feet)  226 Feet    Assistive device  Rolling walker    Gait Pattern  Decreased stride length;Trunk flexed;Decreased hip/knee flexion - right;Decreased hip/knee flexion - left    Ambulation Surface  Level      Knee/Hip Exercises: Aerobic   Nustep  x 5 minutes; Bil LE no UE's, seat 9, for muscular endurance and strengthenining      Knee/Hip Exercises: Standing   Forward Step Up  Both;10 reps;Hand Hold: 1;Step Height: 4";5 reps    Forward Step Up Limitations  10 reps on Lt LE, 5 reps on Rt LE      Knee/Hip Exercises: Seated   Long Arc Quad  Both;2 sets;10 reps    Long Arc Quad Weight  4 lbs.    Knee/Hip Flexion  Seated Marching: 2x 10 reps Bil LE, 4 lbs  Hamstring Curl  Strengthening;Both;2 sets;10 reps;Limitations    Hamstring Limitations  red theraband     Sit to Sand  with UE support;2 sets;5 reps;Other (comment) cushion on chair for elevated surface,Bil UE on thighs        PT Education - 05/22/17 1202    Education provided  Yes    Education Details  Edcuated on exercise technique and energy conservation throughout    [employer]) Educated  Patient    Methods  Explanation    Comprehension  Verbalized understanding       PT Short Term Goals - 04/29/17 1103      PT SHORT TERM GOAL #1   Title  Pt will be independent with HEP and perform consistently in order to maximize overall function.    Baseline  2/12: pt states that she does not perform exercises everyday or as instructed    Time  4    Period  Weeks    Status  On-going      PT SHORT TERM GOAL #2   Title  Pt will have 1/2 grade improvement OR > in all MMT tested in order to decrease pain, maximize gait, and maximize balance in order to promote function at home and in the community.    Baseline  2/12:  see MMT    Time  4    Period  Weeks    Status  Partially Met      PT SHORT TERM GOAL #3   Title  Pt will have improved 3MWT by 175f or > with LRAD in order to maximize her function at home and in the community.    Baseline  2/12: 2088fwith RW, was 11860ft eval    Time  4    Period  Weeks    Status  On-going        PT Long Term Goals - 04/29/17 1104      PT LONG TERM GOAL #1   Title  Pt will have at least 50% improvement or > in standing and walking tolerance (at least 10 mins) before needing to rest with 2/10 LBP and BLE pain in order to allow pt to perform household chores with greater ease.     Baseline  2/12: 10 min standing; 10-15 mins, no pain    Time  8    Period  Weeks    Status  Achieved      PT LONG TERM GOAL #2   Title  Pt will have improved 5xSTS to 25 sec or < with 1 to no UE support to demo improved functional BLE strength and decrease risk for falls.    Baseline  2/12: 51 sec and 46 sec; was 38.7 sec, was 44sec on 1/16    Time  8    Period  Weeks    Status  On-going      PT LONG TERM GOAL #3   Title  Pt will be able to perform SLS on BLE for 10 sec or > with 1 to no UE assist in order to maximize gait and decrease risk for falls.     Baseline  2/12: 15 sec bil SLS with 1 UE support on RW    Time  8    Period  Weeks    Status  Achieved      PT LONG TERM GOAL #4   Title  Pt will be able to perform the TUG in 30 sec or < with LRAD in order to demo  improved balance, decrease risk for falls, and maximize community access.     Baseline  2/12: 1:05 with RW (was 1:06 with RW on 1/16)    Time  8    Period  Weeks    Status  On-going      PT LONG TERM GOAL #5   Title  Pt will have improved lumbar ROM by 25% or > throughout in order to decrease pain and allow pt to perform ADLs with greater ease.     Time  8    Period  Weeks    Status  On-going        Plan - 05/22/17 1212    Clinical Impression Statement  Patient arrived with continued knee pain, right  greater than left. Session today continued current POC and focused on quad and hamstring strengthening as well as functional step-ups and sit to stands. Patient continues to require frequent and extended rest breaks throughout session. She performed NuStep at [facility] for bil LE strengthening to improve muscular endurance. She will continue to benefit from skilled PT services to address current impairments, progress endurance and strength and improve QOL.     Rehab Potential  Fair    PT Frequency  2x / week    PT Duration  4 weeks    PT Treatment/Interventions  ADLs/Self Care Home Management;Cryotherapy;Electrical Stimulation;Moist Heat;Traction;DME Instruction;Gait training;Stair training;Functional mobility training;Therapeutic activities;Therapeutic exercise;Balance training;Patient/family education;Manual techniques;Passive range of motion;Dry needling;Energy conservation;Taping    PT Next Visit Plan  Continue to progress functional strengthening, balance and gait training; small riser stair practice; Nustep for Bil LE only to focus on LE endurance training; cybex machines for BLE strengthening; gait training focusing on improving speed. Replace tennis balls on RW for patient.     PT Home Exercise Plan  eval: SKTC; 1/2: supine clams with RTB; 1/11: seated march, LAQ, ball squeezes; 1/30 - standing hip flexion; 2/7: standing hip abd with RTB; 2/12: mini squats at counter, sit <> stands with decr UE support    Consulted and Agree with Plan of Care  Patient       Patient will benefit from skilled therapeutic intervention in order to improve the following deficits and impairments:  Abnormal gait, Decreased activity tolerance, Decreased balance, Decreased endurance, Decreased mobility, Decreased range of motion, Decreased strength, Difficulty walking, Hypomobility, Increased fascial restricitons, Increased muscle spasms, Impaired flexibility, Improper body mechanics, Postural dysfunction, Obesity, Pain  Visit  Diagnosis: Chronic bilateral low back pain without sciatica  Chronic pain of right knee  Muscle weakness (generalized)  Difficulty in walking, not elsewhere classified     Problem List Patient Active Problem List   Diagnosis Date Noted  . Other hyperparathyroidism ([place]) 11/25/2016  . Hypercalcemia 08/09/2016  . Osteoporosis 10/08/2013  . Hyperlipemia 11/30/2012  . Arthritis of knee, degenerative 09/23/2012  . Knee bursitis 09/23/2012  . Bacteremia 08/13/2012  . Acute pyelonephritis 08/11/2012  . Dehydration 08/11/2012  . Hyponatremia 08/11/2012  . ARF (acute renal failure) ([place]) 08/11/2012  . Uncontrolled type 2 diabetes mellitus with complication, without long-term current use of insulin ([place]) 08/11/2012  . HTN (hypertension) 08/11/2012  . Hypothyroidism 08/11/2012  . Gait abnormality 04/22/2012  . Ankle fracture, bimalleolar, closed 12/10/2011  . Skin breakdown 12/10/2011    Kipp Brood, PT, DPT Physical Therapist with [facility]  05/22/2017 12:19 PM    [place] [address] [place], [place], [address] Phone: [phone]   Fax:  [phone]  Name: Cynthia  DEANDREA Marks MRN: [id number] Date of Birth: Feb 01, 1933

## 2017-05-26 ENCOUNTER — Ambulatory Visit: Payer: Medicare Other | Admitting: "Endocrinology

## 2017-05-27 ENCOUNTER — Ambulatory Visit (HOSPITAL_COMMUNITY): Payer: Medicare Other

## 2017-05-27 ENCOUNTER — Telehealth (HOSPITAL_COMMUNITY): Payer: Self-pay | Admitting: Family Medicine

## 2017-05-27 NOTE — Telephone Encounter (Signed)
05/27/17  pt left a message to [facility] said that she can't make her appt today

## 2017-05-28 ENCOUNTER — Emergency Department (HOSPITAL_COMMUNITY)
Admission: EM | Admit: 2017-05-28 | Discharge: 2017-05-28 | Disposition: A | Discharge status: Home / Self Care | Payer: Medicare Other | Attending: Emergency Medicine | Admitting: Emergency Medicine

## 2017-05-28 ENCOUNTER — Other Ambulatory Visit: Payer: Self-pay

## 2017-05-28 ENCOUNTER — Encounter (HOSPITAL_COMMUNITY): Payer: Self-pay | Admitting: Emergency Medicine

## 2017-05-28 ENCOUNTER — Emergency Department (HOSPITAL_COMMUNITY): Payer: Medicare Other

## 2017-05-28 DIAGNOSIS — E039 Hypothyroidism, unspecified: Secondary | ICD-10-CM | POA: Diagnosis not present

## 2017-05-28 DIAGNOSIS — J111 Influenza due to unidentified influenza virus with other respiratory manifestations: Secondary | ICD-10-CM | POA: Diagnosis not present

## 2017-05-28 DIAGNOSIS — R531 Weakness: Secondary | ICD-10-CM | POA: Diagnosis not present

## 2017-05-28 DIAGNOSIS — I10 Essential (primary) hypertension: Secondary | ICD-10-CM | POA: Insufficient documentation

## 2017-05-28 DIAGNOSIS — R404 Transient alteration of awareness: Secondary | ICD-10-CM | POA: Diagnosis not present

## 2017-05-28 DIAGNOSIS — Z79899 Other long term (current) drug therapy: Secondary | ICD-10-CM | POA: Insufficient documentation

## 2017-05-28 DIAGNOSIS — E119 Type 2 diabetes mellitus without complications: Secondary | ICD-10-CM | POA: Diagnosis not present

## 2017-05-28 DIAGNOSIS — R05 Cough: Secondary | ICD-10-CM | POA: Diagnosis not present

## 2017-05-28 LAB — COMPREHENSIVE METABOLIC PANEL
ALK PHOS: 59 U/L (ref 38–126)
ALT: 8 U/L — AB (ref 14–54)
AST: 18 U/L (ref 15–41)
Albumin: 3.5 g/dL (ref 3.5–5.0)
Anion gap: 8 (ref 5–15)
BILIRUBIN TOTAL: 0.6 mg/dL (ref 0.3–1.2)
BUN: 29 mg/dL — ABNORMAL HIGH (ref 6–20)
CO2: 24 mmol/L (ref 22–32)
CREATININE: 1.1 mg/dL — AB (ref 0.44–1.00)
Calcium: 10.3 mg/dL (ref 8.9–10.3)
Chloride: 104 mmol/L (ref 101–111)
GFR, EST AFRICAN AMERICAN: 53 mL/min — AB (ref >60)
GFR, EST NON AFRICAN AMERICAN: 45 mL/min — AB (ref >60)
Glucose, Bld: 104 mg/dL — ABNORMAL HIGH (ref 65–99)
Potassium: 5 mmol/L (ref 3.5–5.1)
Sodium: 136 mmol/L (ref 135–145)
TOTAL PROTEIN: 7.5 g/dL (ref 6.5–8.1)

## 2017-05-28 LAB — CBC WITH DIFFERENTIAL/PLATELET
Basophils Absolute: 0 10*3/uL (ref 0.0–0.1)
Basophils Relative: 0 %
EOS PCT: 3 %
Eosinophils Absolute: 0.3 10*3/uL (ref 0.0–0.7)
HEMATOCRIT: 35.7 % — AB (ref 36.0–46.0)
HEMOGLOBIN: 11.2 g/dL — AB (ref 12.0–15.0)
LYMPHS ABS: 1 10*3/uL (ref 0.7–4.0)
LYMPHS PCT: 10 %
MCH: 31.3 pg (ref 26.0–34.0)
MCHC: 31.4 g/dL (ref 30.0–36.0)
MCV: 99.7 fL (ref 78.0–100.0)
Monocytes Absolute: 0.8 10*3/uL (ref 0.1–1.0)
Monocytes Relative: 9 %
Neutro Abs: 7.4 10*3/uL (ref 1.7–7.7)
Neutrophils Relative %: 78 %
PLATELETS: 213 10*3/uL (ref 150–400)
RBC: 3.58 MIL/uL — AB (ref 3.87–5.11)
RDW: 13.4 % (ref 11.5–15.5)
WBC: 9.5 10*3/uL (ref 4.0–10.5)

## 2017-05-28 LAB — INFLUENZA PANEL BY PCR (TYPE A & B)
Influenza A By PCR: POSITIVE — AB
Influenza B By PCR: NEGATIVE

## 2017-05-28 MED ORDER — OSELTAMIVIR PHOSPHATE 75 MG PO CAPS
75.0000 mg | ORAL_CAPSULE | Freq: Once | ORAL | Status: AC
  Administered 2017-05-28: 75 mg via ORAL
  Filled 2017-05-28: qty 1

## 2017-05-28 MED ORDER — OSELTAMIVIR PHOSPHATE 75 MG PO CAPS: 75.0000 mg | ORAL_CAPSULE | Freq: Two times a day (BID) | ORAL | 0 refills | Status: DC

## 2017-05-28 NOTE — ED Provider Notes (Signed)
[facility] EMERGENCY DEPARTMENT Provider Note   CSN: [id number] Arrival date & time: 05/28/17  5170     History   Chief Complaint Chief Complaint  Patient presents with  . Cough    HPI Cynthia Marks is a 84 y.o. female.  Patient complains of fever cough weakness runny nose.  This is been going on a couple days   The history is provided by the patient. No language interpreter was used.  Cough  This is a new problem. The current episode started 2 days ago. The problem occurs constantly. The cough is non-productive. There has been no fever. Pertinent negatives include no chest pain and no headaches.    Past Medical History:  Diagnosis Date  . Arthritis    Knees  . Diabetes mellitus   . Hypertension   . Macular degeneration   . Thyroid disease     Patient Active Problem List   Diagnosis Date Noted  . Other hyperparathyroidism ([place]) 11/25/2016  . Hypercalcemia 08/09/2016  . Osteoporosis 10/08/2013  . Hyperlipemia 11/30/2012  . Arthritis of knee, degenerative 09/23/2012  . Knee bursitis 09/23/2012  . Bacteremia 08/13/2012  . Acute pyelonephritis 08/11/2012  . Dehydration 08/11/2012  . Hyponatremia 08/11/2012  . ARF (acute renal failure) ([place]) 08/11/2012  . Uncontrolled type 2 diabetes mellitus with complication, without long-term current use of insulin ([place]) 08/11/2012  . HTN (hypertension) 08/11/2012  . Hypothyroidism 08/11/2012  . Gait abnormality 04/22/2012  . Ankle fracture, bimalleolar, closed 12/10/2011  . Skin breakdown 12/10/2011    Past Surgical History:  Procedure Laterality Date  . ABDOMINAL HYSTERECTOMY    . COLONOSCOPY    . SHOULDER SURGERY      OB History    No data available       Home Medications    Prior to Admission medications   Medication Sig Start Date End Date Taking? Authorizing Provider  acetaminophen (TYLENOL) 650 MG CR tablet Take 650 mg by mouth every 8 (eight) hours as needed for pain.    Yes [provider]  amLODipine (NORVASC) 2.5 MG tablet Take 1 tablet (2.5 mg total) by mouth daily. 02/20/17  Yes Kathyrn Drown, MD  beta carotene w/minerals (OCUVITE) tablet Take 1 tablet by mouth daily. Reported on 07/24/2015   Yes [provider]  carvedilol (COREG) 12.5 MG tablet Take 1/2 tablet by mouth twice daily. 01/09/17  Yes Luking, Scott A, MD  [employer] D3 5000 units capsule TAKE 1 CAPSULE (5,000 UNITS TOTAL) BY MOUTH DAILY. 04/21/17  Yes Cassandria Anger, MD  levothyroxine (SYNTHROID, LEVOTHROID) 150 MCG tablet Take 2 tablets on mon, wed, and Friday. Take one and a half tablets on tues, thurs, sat, and sun 01/09/17  Yes Luking, Scott A, MD  traMADol (ULTRAM) 50 MG tablet Take 1 tablet (50 mg total) by mouth 3 (three) times daily as needed. 07/24/15  Yes Kathyrn Drown, MD  blood glucose meter kit and supplies KIT Dispense based on patient and insurance preference. Test blood sugar once daily. E11.9 06/01/14   Kathyrn Drown, MD  oseltamivir (TAMIFLU) 75 MG capsule Take 1 capsule (75 mg total) by mouth every 12 (twelve) hours. 05/28/17   Milton Ferguson, MD  rosuvastatin (CRESTOR) 20 MG tablet Take 1 tablet (20 mg total) by mouth daily. 01/09/17   Kathyrn Drown, MD    Family History Family History  Problem Relation Age of Onset  . Heart disease Unknown   . Arthritis Unknown   . Cancer  Unknown   . Diabetes Unknown   . Kidney disease Unknown     Social History Social History   Tobacco Use  . Smoking status: Never Smoker  . Smokeless tobacco: Never Used  Substance Use Topics  . Alcohol use: No  . Drug use: No     Allergies   Aspirin; Ibuprofen; and Penicillins   Review of Systems Review of Systems  Constitutional: Negative for appetite change and fatigue.  HENT: Negative for congestion, ear discharge and sinus pressure.   Eyes: Negative for discharge.  Respiratory: Positive for cough.   Cardiovascular: Negative for chest pain.  Gastrointestinal: Negative for abdominal pain  and diarrhea.  Genitourinary: Negative for frequency and hematuria.  Musculoskeletal: Negative for back pain.  Skin: Negative for rash.  Neurological: Negative for seizures and headaches.  Psychiatric/Behavioral: Negative for hallucinations.     Physical Exam Updated Vital Signs BP 138/63   Pulse 84   Temp 98.9 F (37.2 C) (Oral)   Resp 18   Ht 5' 8" (1.727 m)   Wt 80.3 kg (177 lb)   SpO2 97%   BMI 26.91 kg/m   Physical Exam  Constitutional: She is oriented to person, place, and time. She appears well-developed.  HENT:  Head: Normocephalic.  Eyes: Conjunctivae and EOM are normal. No scleral icterus.  Neck: Neck supple. No thyromegaly present.  Cardiovascular: Normal rate and regular rhythm. Exam reveals no gallop and no friction rub.  No murmur heard. Pulmonary/Chest: No stridor. She has no wheezes. She has no rales. She exhibits no tenderness.  Abdominal: She exhibits no distension. There is no tenderness. There is no rebound.  Musculoskeletal: Normal range of motion. She exhibits no edema.  Lymphadenopathy:    She has no cervical adenopathy.  Neurological: She is oriented to person, place, and time. She exhibits normal muscle tone. Coordination normal.  Skin: No rash noted. No erythema.  Psychiatric: She has a normal mood and affect. Her behavior is normal.     ED Treatments / Results  Labs (all labs ordered are listed, but only abnormal results are displayed) Labs Reviewed  INFLUENZA PANEL BY PCR (TYPE A & B) - Abnormal; Notable for the following components:      Result Value   Influenza A By PCR POSITIVE (*)    All other components within normal limits  CBC WITH DIFFERENTIAL/PLATELET - Abnormal; Notable for the following components:   RBC 3.58 (*)    Hemoglobin 11.2 (*)    HCT 35.7 (*)    All other components within normal limits  COMPREHENSIVE METABOLIC PANEL - Abnormal; Notable for the following components:   Glucose, Bld 104 (*)    BUN 29 (*)     Creatinine, Ser 1.10 (*)    ALT 8 (*)    GFR calc non Af Amer 45 (*)    GFR calc Af Amer 53 (*)    All other components within normal limits    EKG  EKG Interpretation None       Radiology Dg Chest 2 View  Result Date: 05/28/2017 CLINICAL DATA:  Cough, body aches for several weeks EXAM: CHEST - 2 VIEW COMPARISON:  None. FINDINGS: There is no focal parenchymal opacity. There is no pleural effusion or pneumothorax. The heart and mediastinal contours are unremarkable. There is old posttraumatic deformity of the left humeral neck. IMPRESSION: No active cardiopulmonary disease. Electronically Signed   By: Kathreen Devoid   On: 05/28/2017 10:35    Procedures Procedures (including critical care  time)  Medications Ordered in ED Medications  oseltamivir (TAMIFLU) capsule 75 mg (not administered)     Initial Impression / Assessment and Plan / ED Course  I have reviewed the triage vital signs and the nursing notes.  Pertinent labs & imaging results that were available during my care of the patient were reviewed by me and considered in my medical decision making (see chart for details).     Patient nontoxic.  Patient with positive flu.  She will be treated with Tamiflu and follow-up with her PCP if needed  Final Clinical Impressions(s) / ED Diagnoses   Final diagnoses:  Influenza    ED Discharge Orders        Ordered    oseltamivir (TAMIFLU) 75 MG capsule  Every 12 hours     05/28/17 1336       Milton Ferguson, MD 05/28/17 1341

## 2017-05-28 NOTE — Discharge Instructions (Signed)
Drink plenty of fluids.  Take Tylenol Motrin for aches and fever.  Follow-up with your doctor if not improving

## 2017-05-28 NOTE — ED Triage Notes (Signed)
Per EMS, cough, body aches for last several weeks. Pt reports "wanted to be checked for flu." cbg [id number]. Pt denies any known fever.

## 2017-05-29 ENCOUNTER — Ambulatory Visit (HOSPITAL_COMMUNITY): Payer: Medicare Other

## 2017-06-05 ENCOUNTER — Encounter (HOSPITAL_COMMUNITY): Payer: Medicare Other

## 2017-06-10 ENCOUNTER — Ambulatory Visit (HOSPITAL_COMMUNITY): Payer: Medicare Other

## 2017-06-10 DIAGNOSIS — M545 Low back pain: Principal | ICD-10-CM

## 2017-06-10 DIAGNOSIS — R262 Difficulty in walking, not elsewhere classified: Secondary | ICD-10-CM

## 2017-06-10 DIAGNOSIS — M25561 Pain in right knee: Secondary | ICD-10-CM

## 2017-06-10 DIAGNOSIS — G8929 Other chronic pain: Secondary | ICD-10-CM

## 2017-06-10 DIAGNOSIS — M6281 Muscle weakness (generalized): Secondary | ICD-10-CM

## 2017-06-10 NOTE — Patient Instructions (Signed)
  Setup Begin by lying on your side with your knees bent 90 degrees, hips and shoulders stacked, and a resistance loop secured around your legs.  Movement Raise your top knee away from the bottom one, then slowly return to the starting position.  Tip Make sure not to roll your hips forward or backward during the exercise.    Setup  Begin lying on your back with your arms resting at your sides, your legs bent at the knees and your feet flat on the ground. Movement  Tighten your abdominals and slowly lift your hips off the floor into a bridge position, keeping your back straight. Tip  Make sure to keep your trunk stiff throughout the exercise and your arms flat on the floor.    Setup  Begin lying on your back with one knee bent and your other leg straight. Movement  Engaging your thigh muscles, slowly lift your straight leg until it is parallel with your other thigh, then lower it back to the starting position and repeat. Tip  Make sure to keep your leg straight and do not let your back arch during the exercise.   Setup  Begin lying on your back with one knee bent and the other resting on a ball/ pillow/ a couple of towels rolled up Movement  Straighten your knee by contracting your thigh muscles, keeping the back of your knee on the ball. Tip  Make sure not to arch your back during the exercise.      Setup  Begin in a standing upright position with one hand resting on a counter in front of you. Movement  Step sideways along the length of the counter. When you reach the end of the counter, side step in the opposite direction back to the starting position. Tip  Make sure to maintain an upright posture and use the counter to help you balance as needed.    Perform at least 3-4 different exercises every day (of all the exercises that have been given to you) -- then do 3-4 more exercises the next day -- work up to being able to perform 3 set of 10-20 reps of each exercise

## 2017-06-10 NOTE — Therapy (Signed)
[place], [place], [address] Phone: [phone]   Fax:  [phone]  Physical Therapy Treatment/Discharge Summary  Patient Details  Name: Cynthia Marks MRN: [id number] Date of Birth: [birth year] Referring Provider: Arther Abbott, MD   Encounter Date: 06/10/2017  PT End of Session - 06/10/17 1510    Visit Number  20    Number of Visits  24    Date for PT Re-Evaluation  05/27/17    Authorization Type  Medicare (secondary: generic commercial)    Authorization Time Period  03/04/17 to 04/29/17; NEW: 04/29/17 to 05/27/17    Authorization - Visit Number  51    Authorization - Number of Visits  24    PT Start Time  1510    PT Stop Time  1558    PT Time Calculation (min)  48 min    Equipment Utilized During Treatment  Gait belt    Activity Tolerance  Patient tolerated treatment well;Patient limited by fatigue    Behavior During Therapy  [facility] for tasks assessed/performed       Past Medical History:  Diagnosis Date  . Arthritis    Knees  . Diabetes mellitus   . Hypertension   . Macular degeneration   . Thyroid disease     Past Surgical History:  Procedure Laterality Date  . ABDOMINAL HYSTERECTOMY    . COLONOSCOPY    . SHOULDER SURGERY      There were no vitals filed for this visit.  Subjective Assessment - 06/10/17 1515    Subjective  Pt reports that she has had the flu. she's finally feeling better but is jsut weak.    Limitations  Walking    Currently in Pain?  No/denies    Pain Onset  More than a month ago         [facility] PT Assessment - 06/10/17 0001      Assessment   Medical Diagnosis  spinal stenosis of lumbar region with neurogenic claudication    Referring Provider  Arther Abbott, MD    Next MD Visit  no f/u appointment; told him to call if her back gets worse      AROM   Lumbar Flexion  50% limited or >    Lumbar Extension  50% limited or >    Lumbar - Right Side Bend  just past  mid-thigh; non-painful    Lumbar - Left Side Bend  almost to knee; non-painful    Lumbar - Right Rotation  50% limited; non-painful    Lumbar - Left Rotation  50% limited; non-painful      Strength   Right Hip Flexion  4+/5 was 4+    Left Hip Flexion  4+/5 was 4+    Right Knee Flexion  4+/5 was 5    Right Knee Extension  4+/5 was 4+    Left Knee Flexion  4+/5 was 5    Left Knee Extension  4+/5 was 4+    Right Ankle Dorsiflexion  4+/5 was 4+    Left Ankle Dorsiflexion  4+/5 was 4+      Ambulation/Gait   Ambulation/Gait  Yes    Ambulation Distance (Feet)  172 Feet 3MWT; was 131f on eval and 2032fon 2/12    Assistive device  Rolling walker    Gait Pattern  Decreased stride length;Trunk flexed;Decreased hip/knee flexion - right;Decreased hip/knee flexion - left    Ambulation Surface  Level    Gait Comments  no increases in pain      Standardized Balance Assessment   Standardized Balance Assessment  Five Times Sit to Stand;Timed Up and Go Test    Five times sit to stand comments   1:13, 1UE on chair, 1 UE on thigh was  51.4 and 46.8 sec with 1 UE      Timed Up and Go Test   TUG  Normal TUG    TUG Comments  57 sec with RW was 1:05 with RW            PT Education - 06/10/17 1606    Education provided  Yes    Education Details  reassessment findings/discharge plans    Person(s) Educated  Patient    Methods  Explanation;Handout    Comprehension  Verbalized understanding       PT Short Term Goals - 06/10/17 1515      PT SHORT TERM GOAL #1   Title  Pt will be independent with HEP and perform consistently in order to maximize overall function.    Baseline  3/26: has been sick so she hasn't been doing them    Time  4    Period  Weeks    Status  On-going      PT SHORT TERM GOAL #2   Title  Pt will have 1/2 grade improvement OR > in all MMT tested in order to decrease pain, maximize gait, and maximize balance in order to promote function at home and in the community.     Baseline  3/26: see MMT    Time  4    Period  Weeks    Status  Partially Met      PT SHORT TERM GOAL #3   Title  Pt will have improved 3MWT by 134f or > with LRAD in order to maximize her function at home and in the community.    Baseline  3/26: 1773fwith RW; was 20484fith RW at last reassessment, and 118f54f eval    Time  4    Period  Weeks    Status  On-going        PT Long Term Goals - 06/10/17 1516      PT LONG TERM GOAL #1   Title  Pt will have at least 50% improvement or > in standing and walking tolerance (at least 10 mins) before needing to rest with 2/10 LBP and BLE pain in order to allow pt to perform household chores with greater ease.     Baseline  2/12: 10 min standing; 10-15 mins, no pain    Time  8    Period  Weeks    Status  Achieved      PT LONG TERM GOAL #2   Title  Pt will have improved 5xSTS to 25 sec or < with 1 to no UE support to demo improved functional BLE strength and decrease risk for falls.    Baseline  3/26: 1:13; on 2/12 was 51 sec and 46 sec; was 38.7 sec, was 44sec on 1/16    Time  8    Period  Weeks    Status  On-going      PT LONG TERM GOAL #3   Title  Pt will be able to perform SLS on BLE for 10 sec or > with 1 to no UE assist in order to maximize gait and decrease risk for falls.     Baseline  2/12: 15 sec bil SLS with 1  UE support on RW    Time  8    Period  Weeks    Status  Achieved      PT LONG TERM GOAL #4   Title  Pt will be able to perform the TUG in 30 sec or < with LRAD in order to demo improved balance, decrease risk for falls, and maximize community access.     Baseline  3/26: 57sec with RW; on 2/12 was 1:05 with RW (was 1:06 with RW on 1/16)    Time  8    Period  Weeks    Status  On-going      PT LONG TERM GOAL #5   Title  Pt will have improved lumbar ROM by 25% or > throughout in order to decrease pain and allow pt to perform ADLs with greater ease.     Time  8    Period  Weeks    Status  On-going             Plan - 06/10/17 1607    Clinical Impression Statement  Pt returns after approximately 2 weeks out of therapy due to dealing with a cold/the flu. PT reassessed pt's goals and outcome measures this date as her cert ended on 0/25/85. Pt has not made any progress towards goals since her last reassessment and from her initial eval. Her MMT, 5xSTS, TUG and 3MWT have all grossly remained unchanged since her last reassessment. Due to lack of progress, pt will be discharged to her HEP. She was provided updated HEP and educated to peform at least 3-4 exercises every day; she verbalized understanding.    Rehab Potential  Fair    PT Frequency  2x / week    PT Duration  4 weeks    PT Treatment/Interventions  ADLs/Self Care Home Management;Cryotherapy;Electrical Stimulation;Moist Heat;Traction;DME Instruction;Gait training;Stair training;Functional mobility training;Therapeutic activities;Therapeutic exercise;Balance training;Patient/family education;Manual techniques;Passive range of motion;Dry needling;Energy conservation;Taping    PT Next Visit Plan  discharged    PT Home Exercise Plan  eval: SKTC; 1/2: supine clams with RTB; 1/11: seated march, LAQ, ball squeezes; 1/30 - standing hip flexion; 2/7: standing hip abd with RTB; 2/12: mini squats at counter, sit <> stands with decr UE support; 3/26: see below for detailed additions    Consulted and Agree with Plan of Care  Patient       Patient will benefit from skilled therapeutic intervention in order to improve the following deficits and impairments:  Abnormal gait, Decreased activity tolerance, Decreased balance, Decreased endurance, Decreased mobility, Decreased range of motion, Decreased strength, Difficulty walking, Hypomobility, Increased fascial restricitons, Increased muscle spasms, Impaired flexibility, Improper body mechanics, Postural dysfunction, Obesity, Pain  Visit Diagnosis: Chronic bilateral low back pain without sciatica - Plan: PT  plan of care cert/re-cert  Chronic pain of right knee - Plan: PT plan of care cert/re-cert  Muscle weakness (generalized) - Plan: PT plan of care cert/re-cert  Difficulty in walking, not elsewhere classified - Plan: PT plan of care cert/re-cert     Problem List Patient Active Problem List   Diagnosis Date Noted  . Other hyperparathyroidism ([place]) 11/25/2016  . Hypercalcemia 08/09/2016  . Osteoporosis 10/08/2013  . Hyperlipemia 11/30/2012  . Arthritis of knee, degenerative 09/23/2012  . Knee bursitis 09/23/2012  . Bacteremia 08/13/2012  . Acute pyelonephritis 08/11/2012  . Dehydration 08/11/2012  . Hyponatremia 08/11/2012  . ARF (acute renal failure) ([place]) 08/11/2012  . Uncontrolled type 2 diabetes mellitus with complication, without long-term current use of insulin (  [place]) 08/11/2012  . HTN (hypertension) 08/11/2012  . Hypothyroidism 08/11/2012  . Gait abnormality 04/22/2012  . Ankle fracture, bimalleolar, closed 12/10/2011  . Skin breakdown 12/10/2011      PHYSICAL THERAPY DISCHARGE SUMMARY  Visits from Start of Care: 20  Current functional level related to goals / functional outcomes: See above   Remaining deficits: See above   Education / Equipment: HEP Plan: Patient agrees to discharge.  Patient goals were partially met. Patient is being discharged due to lack of progress.  ?????       Geraldine Solar PT, [employer] [address] [place], [place], [address] Phone: [phone]   Fax:  [phone]  Name: Cynthia Marks MRN: [id number] Date of Birth: 1932/04/02

## 2017-06-12 ENCOUNTER — Other Ambulatory Visit: Payer: Self-pay | Admitting: "Endocrinology

## 2017-06-12 ENCOUNTER — Ambulatory Visit: Payer: Medicare Other | Admitting: "Endocrinology

## 2017-06-12 DIAGNOSIS — E039 Hypothyroidism, unspecified: Secondary | ICD-10-CM

## 2017-06-12 DIAGNOSIS — E213 Hyperparathyroidism, unspecified: Secondary | ICD-10-CM

## 2017-06-12 DIAGNOSIS — R739 Hyperglycemia, unspecified: Secondary | ICD-10-CM

## 2017-06-13 LAB — PTH, INTACT AND CALCIUM
CALCIUM: 10.7 mg/dL — AB (ref 8.7–10.3)
PTH: 74 pg/mL — AB (ref 15–65)

## 2017-06-13 LAB — T4, FREE: Free T4: 1.36 ng/dL (ref 0.82–1.77)

## 2017-06-13 LAB — TSH: TSH: 2.35 u[IU]/mL (ref 0.450–4.500)

## 2017-06-13 LAB — HGB A1C W/O EAG: Hgb A1c MFr Bld: 5.3 % (ref 4.8–5.6)

## 2017-06-18 ENCOUNTER — Telehealth: Payer: Self-pay | Admitting: Family Medicine

## 2017-06-18 NOTE — Telephone Encounter (Signed)
Please put the patient down for a home visit on April 11 at 11:30 AM please notify the family so they are aware that I will be coming at lunch hour

## 2017-06-19 NOTE — Telephone Encounter (Signed)
Appointment has been scheduled, will call later in the day today to notify family. °

## 2017-06-19 NOTE — Telephone Encounter (Signed)
I called and left a message on [name]'s (spouse) voicemail notifying him of appointment.  I asked him to call back and verify that the appointment would be OK.

## 2017-06-19 NOTE — Telephone Encounter (Signed)
Cynthia Marks called and verified that appointment will work for her.

## 2017-06-24 ENCOUNTER — Encounter: Payer: Self-pay | Admitting: "Endocrinology

## 2017-06-24 ENCOUNTER — Ambulatory Visit (INDEPENDENT_AMBULATORY_CARE_PROVIDER_SITE_OTHER): Payer: Medicare Other | Admitting: "Endocrinology

## 2017-06-24 VITALS — BP 126/80 | HR 86 | Ht 68.0 in | Wt 205.0 lb

## 2017-06-24 DIAGNOSIS — E213 Hyperparathyroidism, unspecified: Secondary | ICD-10-CM

## 2017-06-24 DIAGNOSIS — E039 Hypothyroidism, unspecified: Secondary | ICD-10-CM

## 2017-06-24 NOTE — Progress Notes (Signed)
HPI  Cynthia Marks is a 84 y.o.-year-old female, referred by her PCP, Dr. Wolfgang Phoenix, for  follow-up of hypercalcemia/hyperparathyroidism.  She  was diagnosed  with hypercalcemia approximately  5 years ago. She was worked up and on observation with no surgical intervention with Dr. Product manager in [place]. - Her most recent labs show calcium stable at 10.8, PTH improving to 74 from 110.   She also has stable stage 3 CKD.     I reviewed pt's pertinent labs: Lab Results  Component Value Date   PTH 74 (H) 06/12/2017   PTH Comment 06/12/2017   PTH 110 (H) 10/07/2016   PTH 59 08/09/2016   PTH Comment 08/09/2016   CALCIUM 10.7 (H) 06/12/2017   CALCIUM 10.3 05/28/2017   CALCIUM 10.2 10/07/2016   CALCIUM 10.8 (H) 08/09/2016   CALCIUM 10.4 (H) 05/09/2016   CALCIUM 10.7 (H) 10/24/2015   CALCIUM 10.8 (H) 06/26/2015   CALCIUM 10.5 (H) 09/07/2014   CALCIUM 9.9 09/13/2013   CALCIUM 10.7 (H) 12/24/2012   I reviewed patient's  DEXA scans: Most recent showing osteopenia of her femur bilaterally, improving from osteoporosis into 2015.  AP Spine L1-L3 10/26/2015 81.2 Normal 0.1 1.180 g/cm2 7.9% Yes AP Spine L1-L3 07/13/2013 78.9 Normal -0.6 1.094 g/cm2 - -  DualFemur Neck Right 10/26/2015 81.2 Osteopenia -1.9 0.771 g/cm2 12.1% Yes DualFemur Neck Right 07/13/2013 [age] Osteoporosis -2.5 0.688 g/cm2 - - she denies history of fragility fractures , but reports a 4 from 3 flights of stairs which resulted in closed fracture of her ankle and her orthopedic care.  No h/o kidney stones.  No h/o CKD. Last BUN/Cr: Lab Results  Component Value Date   BUN 29 (H) 05/28/2017   CREATININE 1.10 (H) 05/28/2017    Pt is not on HCTZ.  -She has history of vitamin D deficiency,  currently on vitamin D. Her calcium supplement was discontinued during her prior visit. -   - Her intake of daily D dairy products is approximately that of average.   -She does not have family history of hypercalcemia, pituitary  tumors, thyroid cancer, or osteoporosis.   I reviewed her chart and she also has diffuse arthritis, acute on chronic renal insufficiency.    Current Outpatient Medications:  .  acetaminophen (TYLENOL) 650 MG CR tablet, Take 650 mg by mouth every 8 (eight) hours as needed for pain. , Disp: , Rfl:  .  amLODipine (NORVASC) 2.5 MG tablet, Take 1 tablet (2.5 mg total) by mouth daily., Disp: 30 tablet, Rfl: 3 .  beta carotene w/minerals (OCUVITE) tablet, Take 1 tablet by mouth daily. Reported on 07/24/2015, Disp: , Rfl:  .  blood glucose meter kit and supplies KIT, Dispense based on patient and insurance preference. Test blood sugar once daily. E11.9, Disp: 1 each, Rfl: 0 .  carvedilol (COREG) 12.5 MG tablet, Take 1/2 tablet by mouth twice daily., Disp: 90 tablet, Rfl: 1 .  [employer] D3 5000 units capsule, TAKE 1 CAPSULE (5,000 UNITS TOTAL) BY MOUTH DAILY., Disp: 100 capsule, Rfl: 0 .  levothyroxine (SYNTHROID, LEVOTHROID) 150 MCG tablet, Take 2 tablets on mon, wed, and Friday. Take one and a half tablets on tues, thurs, sat, and sun, Disp: 144 tablet, Rfl: 2 .  oseltamivir (TAMIFLU) 75 MG capsule, Take 1 capsule (75 mg total) by mouth every 12 (twelve) hours., Disp: 10 capsule, Rfl: 0 .  rosuvastatin (CRESTOR) 20 MG tablet, Take 1 tablet (20 mg total) by mouth daily., Disp: 90 tablet, Rfl: 2 .  traMADol (  ULTRAM) 50 MG tablet, Take 1 tablet (50 mg total) by mouth 3 (three) times daily as needed., Disp: 90 tablet, Rfl: 4   Past Medical History:  Diagnosis Date  . Arthritis    Knees  . Diabetes mellitus   . Hypertension   . Macular degeneration   . Thyroid disease    Past Surgical History:  Procedure Laterality Date  . ABDOMINAL HYSTERECTOMY    . COLONOSCOPY    . SHOULDER SURGERY     ROS:  Constitutional: + Fluctuating body weight,  + fatigue, no subjective hyperthermia, no subjective hypothermia Eyes: no blurry vision, no xerophthalmia ENT: no sore throat, no nodules palpated in throat, no  dysphagia/odynophagia, no hoarseness Cardiovascular: no Chest Pain, no Shortness of Breath, no palpitations, no leg swelling Respiratory: no cough, no SOB Gastrointestinal: no Nausea/Vomiting/Diarhhea Musculoskeletal:   She has diffuse arthritis, with arthralgias of larger joints, she walks with a walker, mostly sedentary. Skin: no rashes Neurological: +  admits to significant disequilibrium, no tremors, no numbness, no tingling, no dizziness Psychiatric: no depression, no anxiety  PE: BP 126/80   Pulse 86   Ht 5' 8"  (1.727 m)   Wt 205 lb (93 kg)   BMI 31.17 kg/m  Wt Readings from Last 3 Encounters:  06/24/17 205 lb (93 kg)  05/28/17 177 lb (80.3 kg)  02/19/17 196 lb (88.9 kg)   Constitutional: Significantly over weight for  Height,  not in acute distress, normal state of mind, + uses her walker to get around  Eyes: PERRLA, EOMI, no exophthalmos ENT: moist mucous membranes, no thyromegaly, no cervical lymphadenopathy  Musculoskeletal: +  gross deformities of bilateral feet, strength intact in all four extremities Skin: moist, warm, no rashes Neurological: no tremor with outstretched hands, Deep tendon reflexes normal in all four extremities.  Recent Results (from the past 2160 hour(s))  Influenza panel by PCR (type A & B)     Status: Abnormal   Collection Time: 05/28/17 10:08 AM  Result Value Ref Range   Influenza A By PCR POSITIVE (A) NEGATIVE   Influenza B By PCR NEGATIVE NEGATIVE    Comment: (NOTE) The Xpert Xpress Flu assay is intended as an aid in the diagnosis of  influenza and should not be used as a sole basis for treatment.  This  assay is FDA approved for nasopharyngeal swab specimens only. Nasal  washings and aspirates are unacceptable for Xpert Xpress Flu testing. Performed at [facility], [address] [place], [place] [address]   CBC with Differential/Platelet     Status: Abnormal   Collection Time: 05/28/17 10:45 AM  Result Value Ref Range   WBC 9.5 4.0 -  10.5 K/uL   RBC 3.58 (L) 3.87 - 5.11 MIL/uL   Hemoglobin 11.2 (L) 12.0 - 15.0 g/dL   HCT 35.7 (L) 36.0 - 46.0 %   MCV 99.7 78.0 - 100.0 fL   MCH 31.3 26.0 - 34.0 pg   MCHC 31.4 30.0 - 36.0 g/dL   RDW 13.4 11.5 - 15.5 %   Platelets 213 150 - 400 K/uL   Neutrophils Relative % 78 %   Neutro Abs 7.4 1.7 - 7.7 K/uL   Lymphocytes Relative 10 %   Lymphs Abs 1.0 0.7 - 4.0 K/uL   Monocytes Relative 9 %   Monocytes Absolute 0.8 0.1 - 1.0 K/uL   Eosinophils Relative 3 %   Eosinophils Absolute 0.3 0.0 - 0.7 K/uL   Basophils Relative 0 %   Basophils Absolute 0.0  0.0 - 0.1 K/uL    Comment: Performed at [facility], [address], [place], [place] [address]  Comprehensive metabolic panel     Status: Abnormal   Collection Time: 05/28/17 10:45 AM  Result Value Ref Range   Sodium 136 135 - 145 mmol/L   Potassium 5.0 3.5 - 5.1 mmol/L   Chloride 104 101 - 111 mmol/L   CO2 24 22 - 32 mmol/L   Glucose, Bld 104 (H) 65 - 99 mg/dL   BUN 29 (H) 6 - 20 mg/dL   Creatinine, Ser 1.10 (H) 0.44 - 1.00 mg/dL   Calcium 10.3 8.9 - 10.3 mg/dL   Total Protein 7.5 6.5 - 8.1 g/dL   Albumin 3.5 3.5 - 5.0 g/dL   AST 18 15 - 41 U/L   ALT 8 (L) 14 - 54 U/L   Alkaline Phosphatase 59 38 - 126 U/L   Total Bilirubin 0.6 0.3 - 1.2 mg/dL   GFR calc non Af Amer 45 (L) >60 mL/min   GFR calc Af Amer 53 (L) >60 mL/min    Comment: (NOTE) The eGFR has been calculated using the CKD EPI equation. This calculation has not been validated in all clinical situations. eGFR's persistently <60 mL/min signify possible Chronic Kidney Disease.    Anion gap 8 5 - 15    Comment: Performed at [facility], [address] [place],  [address]  PTH, intact and calcium     Status: Abnormal   Collection Time: 06/12/17  3:13 PM  Result Value Ref Range   Calcium 10.7 (H) 8.7 - 10.3 mg/dL   PTH 74 (H) 15 - 65 pg/mL   PTH Interp Comment     Comment: Interpretation                 Intact PTH    Calcium                                  (pg/mL)      (mg/dL) Normal                          15 - 65     8.6 - 10.2 Primary Hyperparathyroidism         >65          >10.2 Secondary Hyperparathyroidism       >65          <10.2 Non-Parathyroid Hypercalcemia       <65          >10.2 Hypoparathyroidism                  <15          < 8.6 Non-Parathyroid Hypocalcemia    15 - 65          < 8.6   Hgb A1c w/o eAG     Status: None   Collection Time: 06/12/17  3:13 PM  Result Value Ref Range   Hgb A1c MFr Bld 5.3 4.8 - 5.6 %    Comment:          Prediabetes: 5.7 - 6.4          Diabetes: >6.4          Glycemic control for adults with diabetes: <7.0   T4, free     Status: None   Collection Time: 06/12/17  3:13 PM  Result  Value Ref Range   Free T4 1.36 0.82 - 1.77 ng/dL  TSH     Status: None   Collection Time: 06/12/17  3:13 PM  Result Value Ref Range   TSH 2.350 0.450 - 4.500 uIU/mL    Assessment: 1. Hypercalcemia/hyperparathyroidism  Plan: Patient has had several instances of elevated calcium, with the highest level being at 10.8 recently and as high as 11.2 in 2014.  A corresponding intact PTH level is now at 74, improving from 110.   She also has CK D stage 3, and vitamin D deficiency on  supplement.  -She has  apparent complications from hypercalcemia including: + osteoporosis, possible fragility  Fractures, renal insufficiency. No abdominal pain (other than related to diverticulitis/gastritis), depression, bone pain. - She could not perform the 24 hour urine calcium measurement. - She will not require intervention at this time, however, she will need repeat  PTH/calcium, and office visit in 6 month. - Her bone density test is current enough. - If she is found to have hypercalcemia of greater than 1 mg per deciliter above the upper limit of normal by next visit, she will  be considered for low-dose Sensipar. - At age 73 with several comorbidities, she is not ideal candidate for surgery.  2: Hypothyroidism:  -Her recent  thyroid function tests are consistent with appropriate replacement. I advised her to continue levothyroxine 150 mcg p.o. every morning.    - We discussed about correct intake of levothyroxine, at fasting, with water, separated by at least 30 minutes from breakfast, and separated by more than 4 hours from calcium, iron, multivitamins, acid reflux medications (PPIs). -Patient is made aware of the fact that thyroid hormone replacement is needed for life, dose to be adjusted by periodic monitoring of thyroid function tests.  3. Type 2 diabetes: Well controlled With recent A1c was 5.3%. I advised her to discontinue Januvia.  -  Suggestion is made for her to avoid simple carbohydrates  from her diet including Cakes, Sweet Desserts / Pastries, Ice Cream, Soda (diet and regular), Sweet Tea, Candies, Chips, Cookies, Store Bought Juices, Alcohol in Excess of  1-2 drinks a day, Artificial Sweeteners, and "Sugar-free" Products. This will help patient to have stable blood glucose profile and potentially avoid unintended weight gain.  Glade Lloyd, MD

## 2017-06-26 ENCOUNTER — Ambulatory Visit: Payer: Medicare Other | Admitting: Family Medicine

## 2017-06-26 DIAGNOSIS — I10 Essential (primary) hypertension: Secondary | ICD-10-CM

## 2017-06-26 NOTE — Progress Notes (Signed)
   Subjective:    Patient ID: Cynthia Marks, female    DOB: 1933-03-04, 84 y.o.   MRN: [id number]  HPI Face-to-face evaluation Home visit Patient is homebound Suffers with morbid obesity, diet-controlled diabetes, hyperparathyroidism, severe osteoarthritis, chronic back pain, Hypertension, has increased fall risk Does not have any symptoms of depression currently Currently patient denies any chest tightness pressure pain shortness of breath.  She sees Dr. Dorris Fetch for her diabetes. Review of Systems  Constitutional: Negative for activity change, appetite change and fatigue.  HENT: Negative for congestion.   Respiratory: Negative for cough.   Cardiovascular: Negative for chest pain.  Gastrointestinal: Negative for abdominal pain.  Endocrine: Negative for polydipsia and polyphagia.  Skin: Negative for color change.  Neurological: Negative for weakness.  Psychiatric/Behavioral: Negative for confusion.       Objective:   Physical Exam  Constitutional: She appears well-developed and well-nourished. No distress.  HENT:  Head: Normocephalic and atraumatic.  Eyes: Right eye exhibits no discharge. Left eye exhibits no discharge.  Neck: No tracheal deviation present.  Cardiovascular: Normal rate, regular rhythm and normal heart sounds.  No murmur heard. Pulmonary/Chest: Effort normal and breath sounds normal. No respiratory distress. She has no wheezes. She has no rales.  Musculoskeletal: She exhibits no edema.  Lymphadenopathy:    She has no cervical adenopathy.  Neurological: She is alert. She exhibits normal muscle tone.  Skin: Skin is warm and dry. No erythema.  Psychiatric: Her behavior is normal.  Vitals reviewed.         Assessment & Plan:  Morbid obesity Diabetes Hypertension Continue current measures Watch diet We will follow-up patient again in approximately 3 to 4 months

## 2017-07-15 ENCOUNTER — Other Ambulatory Visit: Payer: Self-pay | Admitting: Family Medicine

## 2017-07-22 ENCOUNTER — Encounter (HOSPITAL_COMMUNITY): Payer: Self-pay

## 2017-07-22 ENCOUNTER — Other Ambulatory Visit: Payer: Self-pay

## 2017-07-22 ENCOUNTER — Emergency Department (HOSPITAL_COMMUNITY): Payer: Medicare Other

## 2017-07-22 ENCOUNTER — Emergency Department (HOSPITAL_COMMUNITY)
Admission: EM | Admit: 2017-07-22 | Discharge: 2017-07-22 | Disposition: A | Discharge status: Home / Self Care | Payer: Medicare Other | Attending: Emergency Medicine | Admitting: Emergency Medicine

## 2017-07-22 ENCOUNTER — Telehealth: Payer: Self-pay

## 2017-07-22 DIAGNOSIS — Y93E1 Activity, personal bathing and showering: Secondary | ICD-10-CM | POA: Diagnosis not present

## 2017-07-22 DIAGNOSIS — Y92009 Unspecified place in unspecified non-institutional (private) residence as the place of occurrence of the external cause: Secondary | ICD-10-CM

## 2017-07-22 DIAGNOSIS — M79604 Pain in right leg: Secondary | ICD-10-CM | POA: Diagnosis not present

## 2017-07-22 DIAGNOSIS — S8011XA Contusion of right lower leg, initial encounter: Secondary | ICD-10-CM

## 2017-07-22 DIAGNOSIS — G8929 Other chronic pain: Secondary | ICD-10-CM | POA: Insufficient documentation

## 2017-07-22 DIAGNOSIS — S79912A Unspecified injury of left hip, initial encounter: Secondary | ICD-10-CM | POA: Diagnosis not present

## 2017-07-22 DIAGNOSIS — Z79899 Other long term (current) drug therapy: Secondary | ICD-10-CM | POA: Insufficient documentation

## 2017-07-22 DIAGNOSIS — R279 Unspecified lack of coordination: Secondary | ICD-10-CM | POA: Diagnosis not present

## 2017-07-22 DIAGNOSIS — I10 Essential (primary) hypertension: Secondary | ICD-10-CM | POA: Insufficient documentation

## 2017-07-22 DIAGNOSIS — Z743 Need for continuous supervision: Secondary | ICD-10-CM | POA: Diagnosis not present

## 2017-07-22 DIAGNOSIS — S8991XA Unspecified injury of right lower leg, initial encounter: Secondary | ICD-10-CM | POA: Diagnosis not present

## 2017-07-22 DIAGNOSIS — Y999 Unspecified external cause status: Secondary | ICD-10-CM | POA: Insufficient documentation

## 2017-07-22 DIAGNOSIS — E119 Type 2 diabetes mellitus without complications: Secondary | ICD-10-CM | POA: Diagnosis not present

## 2017-07-22 DIAGNOSIS — S7002XA Contusion of left hip, initial encounter: Secondary | ICD-10-CM | POA: Diagnosis not present

## 2017-07-22 DIAGNOSIS — M25552 Pain in left hip: Secondary | ICD-10-CM | POA: Diagnosis not present

## 2017-07-22 DIAGNOSIS — M7989 Other specified soft tissue disorders: Secondary | ICD-10-CM | POA: Diagnosis not present

## 2017-07-22 DIAGNOSIS — W19XXXA Unspecified fall, initial encounter: Secondary | ICD-10-CM

## 2017-07-22 DIAGNOSIS — M25561 Pain in right knee: Secondary | ICD-10-CM | POA: Insufficient documentation

## 2017-07-22 DIAGNOSIS — Y929 Unspecified place or not applicable: Secondary | ICD-10-CM | POA: Diagnosis not present

## 2017-07-22 DIAGNOSIS — E039 Hypothyroidism, unspecified: Secondary | ICD-10-CM | POA: Diagnosis not present

## 2017-07-22 DIAGNOSIS — W182XXA Fall in (into) shower or empty bathtub, initial encounter: Secondary | ICD-10-CM | POA: Diagnosis not present

## 2017-07-22 DIAGNOSIS — S99921A Unspecified injury of right foot, initial encounter: Secondary | ICD-10-CM | POA: Diagnosis not present

## 2017-07-22 DIAGNOSIS — S99911A Unspecified injury of right ankle, initial encounter: Secondary | ICD-10-CM | POA: Diagnosis not present

## 2017-07-22 DIAGNOSIS — T148XXA Other injury of unspecified body region, initial encounter: Secondary | ICD-10-CM | POA: Diagnosis not present

## 2017-07-22 HISTORY — DX: Other chronic pain: G89.29

## 2017-07-22 HISTORY — DX: Dorsalgia, unspecified: M54.9

## 2017-07-22 HISTORY — DX: Pain in right knee: M25.561

## 2017-07-22 HISTORY — DX: Unspecified abnormalities of gait and mobility: R26.9

## 2017-07-22 MED ORDER — MORPHINE SULFATE (PF) 2 MG/ML IV SOLN
2.0000 mg | Freq: Once | INTRAVENOUS | Status: AC
  Administered 2017-07-22: 2 mg via INTRAVENOUS
  Filled 2017-07-22: qty 1

## 2017-07-22 MED ORDER — HYDROCODONE-ACETAMINOPHEN 5-325 MG PO TABS: 1.0000 | ORAL_TABLET | Freq: Once | ORAL | Status: DC

## 2017-07-22 MED ORDER — MORPHINE SULFATE (PF) 2 MG/ML IV SOLN: 2.0000 mg | INTRAVENOUS | Status: DC | PRN

## 2017-07-22 NOTE — ED Notes (Signed)
Pt says pain is too bad to try to ambulate at this time.  Primary Rn getting pain medication.

## 2017-07-22 NOTE — ED Provider Notes (Signed)
[facility] EMERGENCY DEPARTMENT Provider Note   CSN: [id number] Arrival date & time: 07/22/17  1136     History   Chief Complaint Chief Complaint  Patient presents with  . Fall    HPI Cynthia Marks is a 84 y.o. female.  HPI  Pt was seen at 1215. Per pt and her family, c/o sudden onset and resolution of one episode of slip and fall that occurred today PTA. Pt was standing up from shower chair when her "foot slipped" and she fell onto her left hip. Pt c/o left hip pain, right ankle/foot pain, right lower leg bruise, and acute flair of her chronic right knee pain. Pt was unable to stand after the fall due to her hip pain. Family states pt did not hit her head. No LOC, no AMS. Denies CP/SOB, no abd pain, no N/V/D, no neck or back pain, no focal motor weakness, no tingling/numbness in extremities.    Past Medical History:  Diagnosis Date  . Arthritis    Knees  . Chronic back pain   . Chronic pain of right knee   . Diabetes mellitus   . Gait abnormality   . Hypertension   . Macular degeneration   . Thyroid disease     Patient Active Problem List   Diagnosis Date Noted  . Hyperparathyroidism ([place]) 11/25/2016  . Hypercalcemia 08/09/2016  . Osteoporosis 10/08/2013  . Hyperlipemia 11/30/2012  . Arthritis of knee, degenerative 09/23/2012  . Knee bursitis 09/23/2012  . Bacteremia 08/13/2012  . Acute pyelonephritis 08/11/2012  . Dehydration 08/11/2012  . Hyponatremia 08/11/2012  . ARF (acute renal failure) ([place]) 08/11/2012  . Uncontrolled type 2 diabetes mellitus with complication, without long-term current use of insulin ([place]) 08/11/2012  . HTN (hypertension) 08/11/2012  . Hypothyroidism 08/11/2012  . Gait abnormality 04/22/2012  . Ankle fracture, bimalleolar, closed 12/10/2011  . Skin breakdown 12/10/2011    Past Surgical History:  Procedure Laterality Date  . ABDOMINAL HYSTERECTOMY    . COLONOSCOPY    . SHOULDER SURGERY       OB History   None       Home Medications    Prior to Admission medications   Medication Sig Start Date End Date Taking? Authorizing Provider  acetaminophen (TYLENOL) 650 MG CR tablet Take 650 mg by mouth every 8 (eight) hours as needed for pain.    Yes [provider]  amLODipine (NORVASC) 2.5 MG tablet TAKE 1 TABLET BY MOUTH EVERY DAY 07/15/17  Yes Luking, Scott A, MD  beta carotene w/minerals (OCUVITE) tablet Take 1 tablet by mouth daily. Reported on 07/24/2015   Yes [provider]  carvedilol (COREG) 12.5 MG tablet Take 1/2 tablet by mouth twice daily. 01/09/17  Yes Luking, Scott A, MD  CVS D3 5000 units capsule TAKE 1 CAPSULE (5,000 UNITS TOTAL) BY MOUTH DAILY. 04/21/17  Yes Cassandria Anger, MD  levothyroxine (SYNTHROID, LEVOTHROID) 150 MCG tablet Take 2 tablets on mon, wed, and Friday. Take one and a half tablets on tues, thurs, sat, and sun 01/09/17  Yes Luking, Elayne Snare, MD    Family History Family History  Problem Relation Age of Onset  . Heart disease Unknown   . Arthritis Unknown   . Cancer Unknown   . Diabetes Unknown   . Kidney disease Unknown     Social History Social History   Tobacco Use  . Smoking status: Never Smoker  . Smokeless tobacco: Never Used  Substance Use Topics  . Alcohol use:  No  . Drug use: No     Allergies   Aspirin; Ibuprofen; and Penicillins   Review of Systems Review of Systems ROS: Statement: All systems negative except as marked or noted in the HPI; Constitutional: Negative for fever and chills. ; ; Eyes: Negative for eye pain, redness and discharge. ; ; ENMT: Negative for ear pain, hoarseness, nasal congestion, sinus pressure and sore throat. ; ; Cardiovascular: Negative for chest pain, palpitations, diaphoresis, dyspnea and peripheral edema. ; ; Respiratory: Negative for cough, wheezing and stridor. ; ; Gastrointestinal: Negative for nausea, vomiting, diarrhea, abdominal pain, blood in stool, hematemesis, jaundice and rectal bleeding. .  ; ; Genitourinary: Negative for dysuria, flank pain and hematuria. ; ; Musculoskeletal: Negative for back pain and neck pain. +left hip pain, right knee pain, right ankle/foot pain..; ; Skin: +bruise right lower leg. Negative for pruritus, rash, abrasions, blisters, and skin lesion.; ; Neuro: Negative for headache, lightheadedness and neck stiffness. Negative for weakness, altered level of consciousness, altered mental status, extremity weakness, paresthesias, involuntary movement, seizure and syncope.       Physical Exam Updated Vital Signs BP (!) 144/85   Pulse 76   Temp 98.1 F (36.7 C) (Oral)   Resp 18   Ht 5\' 8"  (1.727 m)   Wt 90.7 kg (200 lb)   SpO2 98%   BMI 30.41 kg/m   Physical Exam 1220: Physical examination:  Nursing notes reviewed; Vital signs and O2 SAT reviewed;  Constitutional: Well developed, Well nourished, Well hydrated, In no acute distress; Head:  Normocephalic, atraumatic; Eyes: EOMI, PERRL, No scleral icterus; ENMT: Mouth and pharynx normal, Mucous membranes moist; Neck: Supple, Full range of motion, No lymphadenopathy; Cardiovascular: Regular rate and rhythm, No gallop; Respiratory: Breath sounds clear & equal bilaterally, No wheezes.  Speaking full sentences with ease, Normal respiratory effort/excursion; Chest: Nontender, Movement normal; Abdomen: Soft, Nontender, Nondistended, Normal bowel sounds; Genitourinary: No CVA tenderness; Extremities: Peripheral pulses normal, Pelvis stable. +left hip tenderness to palp. No ecchymosis, no edema. No calf edema or asymmetry. +bruise to right patellar area and right lower tibial area. No open wounds. +FROM bilat knees. No ligamentous laxity bilat.  No patellar or quad tendon step-offs.  NMS intact bilat feet, strong pedal pp. +plantarflexion of right and left foot w/calf squeeze.  No palpable gap right or left  Achilles's tendon.  No proximal fibular head tenderness bilat.  No edema, erythema, warmth, or deformity bilat. Mild  generalized TTP right knee without specific area of point tenderness. NT left knee, bilat ankles and feet. NMS intact right and left foot, strong pedal pp, LE muscle compartments soft.  No deformity, no ecchymosis, no open wounds.;; Neuro: AA&Ox3, Major CN grossly intact.  Speech clear. No gross focal motor or sensory deficits in extremities.; Skin: Color normal, Warm, Dry.   ED Treatments / Results  Labs (all labs ordered are listed, but only abnormal results are displayed)   EKG None  Radiology   Procedures Procedures (including critical care time)  Medications Ordered in ED Medications - No data to display   Initial Impression / Assessment and Plan / ED Course  I have reviewed the triage vital signs and the nursing notes.  Pertinent labs & imaging results that were available during my care of the patient were reviewed by me and considered in my medical decision making (see chart for details).  MDM Reviewed: previous chart, nursing note and vitals Interpretation: x-ray    Dg Tibia/fibula Right Result Date: 07/22/2017 CLINICAL DATA:  Fall in shower with right leg pain. Initial encounter. EXAM: RIGHT TIBIA AND FIBULA - 2 VIEW COMPARISON:  12/04/2011 FINDINGS: Sizable knee joint effusion that was also seen on the 2018 knee film. Remote bimalleolar fractures with completed healing. No acute fracture or malalignment is seen. Nonspecific soft tissue swelling about the ankle. IMPRESSION: 1. No acute finding. 2. Knee joint effusion also seen on 02/19/2017 radiograph. Electronically Signed   By: Monte Fantasia M.D.   On: 07/22/2017 13:53   Dg Ankle Complete Right Result Date: 07/22/2017 CLINICAL DATA:  Fall in shower with right leg pain. Initial encounter. EXAM: RIGHT ANKLE - COMPLETE 3+ VIEW COMPARISON:  03/16/2012 FINDINGS: Soft tissue swelling. Remote medial and lateral malleolus fractures that are healed. Degenerative spurring at the ankle joint. Negative for joint effusion. Heel  spur. Osteopenia. IMPRESSION: 1. Soft tissue swelling without acute osseous finding. 2. Remote and healed bimalleolar fractures. Electronically Signed   By: Monte Fantasia M.D.   On: 07/22/2017 13:49   Dg Knee Complete 4 Views Right Result Date: 07/22/2017 CLINICAL DATA:  Fall in shower with right leg pain. Initial encounter. EXAM: RIGHT KNEE - COMPLETE 4+ VIEW COMPARISON:  02/19/2017 FINDINGS: Sizable joint effusion that was also seen previously. No definite fat component. No fracture or malalignment, sclerosis below the lateral tibial plateau is stable. Generalized degenerative marginal spurring. Osteopenia. IMPRESSION: 1. Large joint effusion which was also seen on a 02/19/2017 study. No visible fracture. 2. Mild generalized degenerative spurring. Electronically Signed   By: Monte Fantasia M.D.   On: 07/22/2017 13:51   Dg Foot Complete Right Result Date: 07/22/2017 CLINICAL DATA:  Fall in shower with right leg pain. Initial encounter. EXAM: RIGHT FOOT COMPLETE - 3+ VIEW COMPARISON:  None. FINDINGS: Soft tissue swelling at the ankle as described on dedicated imaging. Bones are osteopenic. No acute fracture or malalignment. Small heel spur. Hallux valgus with early toe crossing. Osteopenia. IMPRESSION: No acute osseous finding. Electronically Signed   By: Monte Fantasia M.D.   On: 07/22/2017 13:54   Dg Hip Unilat With Pelvis 2-3 Views Left Result Date: 07/22/2017 CLINICAL DATA:  Fall from sitting. EXAM: DG HIP (WITH OR WITHOUT PELVIS) 2-3V LEFT COMPARISON:  None. FINDINGS: There is no evidence of hip fracture or dislocation. There is no evidence of arthropathy or other focal bone abnormality. Sclerotic change of the pubic symphysis. IMPRESSION: Normal left hip radiographs for age. Electronically Signed   By: Ulyses Jarred M.D.   On: 07/22/2017 13:44    1445:  XR reassuring. Pt does not walk nor stand at home/per baseline. Pt has a lift chair to get around for the past several months, per pt and family.  Pt's PMD apparently visits pt at home, due to her limited mobility. Pt is able to sit up on the edge of the bed with assistance while in the ED: this is pt's baseline per pt and family at bedside witnessing this activity. Pt ready to go home now. Tx symptomatically at this time. Dx and testing d/w pt and family.  Questions answered.  Verb understanding, agreeable to d/c home with outpt f/u.  1455:  Family and pt now state to me "what are you going to do about her not walking?" Reminded pt and her family they told ED staff this condition has been for many months. All agree that pt has not walked "in months." Discussion with pt and family re: ED role in continuum of healthcare, chronic vs acute conditions, as well as criteria for  hospital admission; all verb understanding. Pt does not want narcotic pain meds and prefers to take OTC tylenol for pain. Encouraged to f/u with PMD and Ortho MD. CM to evaluate pt and I placed [facility] consult.     Final Clinical Impressions(s) / ED Diagnoses   Final diagnoses:  None    ED Discharge Orders    None       Francine Graven, DO 07/25/17 [id number]

## 2017-07-22 NOTE — ED Notes (Signed)
EDP at bedside  

## 2017-07-22 NOTE — ED Triage Notes (Signed)
Pt reports her r knee gave out while she was getting up from her bsc and she fell.  C/O pain to left hip.  Shortening of left leg noted.  Pedal pulses present.  Pt also has large bruise to r lower leg and bruise to r knee.  Denies hitting head, denies any LOC.

## 2017-07-22 NOTE — ED Notes (Signed)
Attempted IV access x2. Will get another RN to attempt.  

## 2017-07-22 NOTE — Discharge Instructions (Addendum)
Take your usual prescriptions as previously directed.  Apply moist heat or ice to the area(s) of discomfort, for 15 minutes at a time, several times per day for the next few days.  Do not fall asleep on a heating or ice pack.  Call your regular medical doctor today to schedule a follow up appointment this week.  Call your Orthopedic doctor today to schedule a follow up appointment within the next week. Return to the Emergency Department immediately if worsening.

## 2017-07-22 NOTE — Care Management (Signed)
Called again to speak to family. Per EDP, patient's husband has been to see Dr. Wolfgang Phoenix. Dr. Wolfgang Phoenix would like for CM to discuss with husband.  Discussed with patient's husband. He also is agreeable to plan.

## 2017-07-22 NOTE — Telephone Encounter (Signed)
Thank you sounds good

## 2017-07-22 NOTE — ED Notes (Signed)
Pt's husband returned. Secretary to call EMS for transport home.

## 2017-07-22 NOTE — ED Notes (Signed)
Pt requesting medication for pain. EDP notified.

## 2017-07-22 NOTE — Care Management Note (Addendum)
Case Management Note  Patient Details  Name: Cynthia Marks MRN: [id number] Date of Birth: [birth year]    Expected Discharge Date:   07/22/2017               Expected Discharge Plan:  [place]  In-House Referral:     Discharge planning Services  CM Consult  Post Acute Care Choice:  Home Health Choice offered to:  Patient  DME Arranged:    DME Agency:     HH Arranged:  PT, OT, RN, aide, Social work [employer] Agency:  [employer]  Status of Service:  Completed, signed off  If discussed at [employer], dates discussed:    Additional Comments: Patient discharging from ER. Here for fall. Sister in law at bedside. Family feels patient should be admitted. Patient reports being weak even prior to fall. Walks with RW. Patient will need Home health ASAP. Offered choice.  [name] of [employer] home health can  arrange for [facility] tomorrow. Patient and SIL agreeable. RN, PT, OT, aide and SW ordered. Discussed with [name] of [facility] that patient will need all disciplines and special attention to SW to facilitate long term plans for patient in regards to care/ALF options for the future.  Patient mentions she has a long term care plan, [facility] will evaluate and assist patient to determine options available.   Keigan Tafoya, Chauncey Reading, RN 07/22/2017, 3:27 PM

## 2017-07-22 NOTE — Care Management (Signed)
Called again to speak to family. Called into room. Sister in law states she does not think patient should be discharged. Explained again that patient does not meet criteria to be admitted to hospital. Explain qualifications needed for Medicare to pay for rehab. Sister in law seems satisfied and unsure as to why I was called again.

## 2017-07-22 NOTE — Telephone Encounter (Signed)
I spoke with Tresea Mall she is working on checking pt information out [employer]. She states she has a Marine scientist going out to the pt home on tomorrow for eval and she has done some of the investigating on the insurance and seen that the pt has a long term care policy that will pay $409.73 per day for assistant living (cost for assistant living any where from 3,500 - 4,000 per month pt would be responsible for the difference per month in that case and she also has a friend that does personal care in the home 4-5 hours per day she will keep [place] up to date on the status of this pt the more she finds out, as she will touch base with the family later this afternoon to see what option they prefer.

## 2017-07-22 NOTE — Telephone Encounter (Signed)
I called [name] to find out who with Home health has been helping them he [facility] [phone].I called and left her a message to return call to [place].I also called [employer] and spoke with Davita there she states the pt would be new to them,they have never seen her,but Tresea Mall had called and told them Jacayla would be a new start and would need a lot of Social Work help.That [name] would be the one to speak with regarding this pt's health. Dr.Scott had wanted to speak with someone in regards to getting help at home or assistant living. Awaiting call back from Tresea Mall.Family is overwhelmed and he needs placement.

## 2017-07-22 NOTE — ED Notes (Signed)
Pt given crackers and Sprite per request. Will call EMS when pt's husband returns to ED. Pt cannot get in the house.

## 2017-07-22 NOTE — Telephone Encounter (Signed)
Patient Husband Jenny Reichmann) walked into the clinic today with concerns of his wife being released from the ed and him being unable to care for her as she needs.

## 2017-07-22 NOTE — ED Notes (Signed)
EDP at bedside updating patient and family. 

## 2017-07-22 NOTE — ED Notes (Signed)
Pt able to sit up on the edge of bed with assistance. Per pt and pt's family member this is baseline for the patient. Pt states she used to ambulate with a walker, but in recent months has only used her lift chair to get around.

## 2017-07-22 NOTE — ED Notes (Signed)
EMS at bedside

## 2017-07-23 ENCOUNTER — Telehealth: Payer: Self-pay

## 2017-07-23 DIAGNOSIS — Z9181 History of falling: Secondary | ICD-10-CM | POA: Diagnosis not present

## 2017-07-23 DIAGNOSIS — M1990 Unspecified osteoarthritis, unspecified site: Secondary | ICD-10-CM | POA: Diagnosis not present

## 2017-07-23 DIAGNOSIS — S8011XD Contusion of right lower leg, subsequent encounter: Secondary | ICD-10-CM | POA: Diagnosis not present

## 2017-07-23 DIAGNOSIS — E213 Hyperparathyroidism, unspecified: Secondary | ICD-10-CM | POA: Diagnosis not present

## 2017-07-23 DIAGNOSIS — E119 Type 2 diabetes mellitus without complications: Secondary | ICD-10-CM | POA: Diagnosis not present

## 2017-07-23 DIAGNOSIS — S7002XD Contusion of left hip, subsequent encounter: Secondary | ICD-10-CM | POA: Diagnosis not present

## 2017-07-23 DIAGNOSIS — I10 Essential (primary) hypertension: Secondary | ICD-10-CM | POA: Diagnosis not present

## 2017-07-23 NOTE — Telephone Encounter (Signed)
Thank you for the update!

## 2017-07-23 NOTE — Telephone Encounter (Signed)
FYI :Per [name] at [facility] saw pt this and and personal care came in this am also and they set up the service to start on Friday . To help cook and clean bathing etc for two days a week, they have Pt coming in to help also. They want to keep in home if at all possible, but will continue to monitor and will help family to get placed in assistant living if need be.

## 2017-07-24 DIAGNOSIS — M1990 Unspecified osteoarthritis, unspecified site: Secondary | ICD-10-CM | POA: Diagnosis not present

## 2017-07-24 DIAGNOSIS — S8011XD Contusion of right lower leg, subsequent encounter: Secondary | ICD-10-CM | POA: Diagnosis not present

## 2017-07-24 DIAGNOSIS — I10 Essential (primary) hypertension: Secondary | ICD-10-CM | POA: Diagnosis not present

## 2017-07-24 DIAGNOSIS — E119 Type 2 diabetes mellitus without complications: Secondary | ICD-10-CM | POA: Diagnosis not present

## 2017-07-24 DIAGNOSIS — S7002XD Contusion of left hip, subsequent encounter: Secondary | ICD-10-CM | POA: Diagnosis not present

## 2017-07-28 ENCOUNTER — Telehealth: Payer: Self-pay | Admitting: *Deleted

## 2017-07-28 ENCOUNTER — Telehealth: Payer: Self-pay | Admitting: Orthopedic Surgery

## 2017-07-28 DIAGNOSIS — I10 Essential (primary) hypertension: Secondary | ICD-10-CM | POA: Diagnosis not present

## 2017-07-28 DIAGNOSIS — M1990 Unspecified osteoarthritis, unspecified site: Secondary | ICD-10-CM | POA: Diagnosis not present

## 2017-07-28 DIAGNOSIS — S8011XD Contusion of right lower leg, subsequent encounter: Secondary | ICD-10-CM | POA: Diagnosis not present

## 2017-07-28 DIAGNOSIS — S7002XD Contusion of left hip, subsequent encounter: Secondary | ICD-10-CM | POA: Diagnosis not present

## 2017-07-28 DIAGNOSIS — E119 Type 2 diabetes mellitus without complications: Secondary | ICD-10-CM | POA: Diagnosis not present

## 2017-07-28 NOTE — Telephone Encounter (Signed)
No Number was taken to call the home health nurse back.I called the pt and she will call me back with a number for the home health,but she states she has been incontient and needs a medication for this.

## 2017-07-28 NOTE — Telephone Encounter (Signed)
Pt and husband aware you will coming by on Thursday at 11:30 am to their home.(Home health nurse # [phone])

## 2017-07-28 NOTE — Telephone Encounter (Signed)
Pt states she is suppose to come in for a follow up from ED visit. She states she cannot walk and not able to come in. States physical therapy is coming out today for first visit and a cna will come on tues and Thursday. Has been getting family to stay with her to help out. Pain is better from the fall. States you came out to her house once before and wonders if you can do another home visit since she can't walk. She states she hates to ask you to do that but doesn't know what else to do since they would not put her in a nursing home.

## 2017-07-28 NOTE — Telephone Encounter (Signed)
Left message to return call 

## 2017-07-28 NOTE — Telephone Encounter (Signed)
[facility] nurse from [facility]  called stating she feels that patient will need a medicine for a over active bladder due to the inconstancy . Please advise

## 2017-07-28 NOTE — Telephone Encounter (Signed)
There is no medication for incontinence that I would recommend without her being seen later this week-if the nurse is able to collect a urine for urinalysis and urine culture that would be fine

## 2017-07-28 NOTE — Telephone Encounter (Signed)
Patient called stating she needs to make a ER follow up appt but she cannot get to the office, patient would like to talk to the nurse. Please advise

## 2017-07-28 NOTE — Telephone Encounter (Signed)
I tried calling no answer I left a message to r/[phone]) [phone] Nurse with [facility]

## 2017-07-28 NOTE — Telephone Encounter (Signed)
I could do a home visit on Thursday at 1130.  Please put on my schedule, please notify patient

## 2017-07-28 NOTE — Telephone Encounter (Signed)
[place], Miss Cynthia Marks, called, voice message received - direct phone#[phone], to relay that she was aware Dr Aline Brochure has seen patient in the past, and that she had a new injury/fall, 07/22/17, for which she was treated at [facility] Emergency room. Please call back. (I left a voice message in response, to let her know I was routing the note to clinical staff.)

## 2017-07-29 NOTE — Telephone Encounter (Signed)
Home health nurse wants [place] to make her an appointment to come in for the new fall will you call? she has knee pain

## 2017-07-30 DIAGNOSIS — I10 Essential (primary) hypertension: Secondary | ICD-10-CM | POA: Diagnosis not present

## 2017-07-30 DIAGNOSIS — S7002XD Contusion of left hip, subsequent encounter: Secondary | ICD-10-CM | POA: Diagnosis not present

## 2017-07-30 DIAGNOSIS — E119 Type 2 diabetes mellitus without complications: Secondary | ICD-10-CM | POA: Diagnosis not present

## 2017-07-30 DIAGNOSIS — M1990 Unspecified osteoarthritis, unspecified site: Secondary | ICD-10-CM | POA: Diagnosis not present

## 2017-07-30 DIAGNOSIS — S8011XD Contusion of right lower leg, subsequent encounter: Secondary | ICD-10-CM | POA: Diagnosis not present

## 2017-07-30 NOTE — Telephone Encounter (Signed)
Called back to patient to offer appointment - scheduled; aware.

## 2017-07-30 NOTE — Telephone Encounter (Signed)
Patient being seen by Dr Nicki Reaper tomorrow- have not had return call from home health nurse concerning collecting urine for U/A and culture

## 2017-07-30 NOTE — Telephone Encounter (Signed)
noted 

## 2017-07-31 ENCOUNTER — Ambulatory Visit: Payer: Medicare Other | Admitting: Family Medicine

## 2017-07-31 DIAGNOSIS — M1990 Unspecified osteoarthritis, unspecified site: Secondary | ICD-10-CM | POA: Diagnosis not present

## 2017-07-31 DIAGNOSIS — S8011XD Contusion of right lower leg, subsequent encounter: Secondary | ICD-10-CM | POA: Diagnosis not present

## 2017-07-31 DIAGNOSIS — W01198D Fall on same level from slipping, tripping and stumbling with subsequent striking against other object, subsequent encounter: Secondary | ICD-10-CM | POA: Diagnosis not present

## 2017-07-31 DIAGNOSIS — S7002XD Contusion of left hip, subsequent encounter: Secondary | ICD-10-CM | POA: Diagnosis not present

## 2017-07-31 DIAGNOSIS — M79661 Pain in right lower leg: Secondary | ICD-10-CM | POA: Diagnosis not present

## 2017-07-31 DIAGNOSIS — E119 Type 2 diabetes mellitus without complications: Secondary | ICD-10-CM | POA: Diagnosis not present

## 2017-07-31 DIAGNOSIS — S8011XS Contusion of right lower leg, sequela: Secondary | ICD-10-CM

## 2017-07-31 DIAGNOSIS — M1711 Unilateral primary osteoarthritis, right knee: Secondary | ICD-10-CM

## 2017-07-31 DIAGNOSIS — I10 Essential (primary) hypertension: Secondary | ICD-10-CM | POA: Diagnosis not present

## 2017-07-31 NOTE — Progress Notes (Signed)
Assessment multiple contusions Debilitated condition Weekend muscle condition  frailty Hypertension decent control Diabetes decent control Morbid obesity patient is in need of full-time assistance.  Hopefully enough help can come to her at her home   To help her  Face-to-face evaluation done patient would benefit from ongoing physical therapy occupational therapy home health and a nurses aide.  Patient unable to do ADLs without assistance Has been unable to help her because of his debilitated condition If patient progressively gets worse may end up with assisted living.  Patient with recent fall went to the ER had multiple x-rays these were all negative these were reviewed in detail Today lungs are clear respiratory rate normal heart is regular no murmurs skin warm dry pulses normal swelling is noted in the right lower leg but no obvious sign of DVT he does have bruising in the lower leg.  Patient is very debilitated and also very deconditioned because of this she does need to aid with her on a regular basis she has her elderly husband but he has severe heart disease and there is a limit to what he can do.

## 2017-08-02 ENCOUNTER — Inpatient Hospital Stay (HOSPITAL_COMMUNITY)
Admission: EM | Admit: 2017-08-02 | Discharge: 2017-08-05 | DRG: 641 | Disposition: A | Discharge status: Skilled Nursing Facility | Payer: Medicare Other | Attending: Internal Medicine | Admitting: Internal Medicine

## 2017-08-02 ENCOUNTER — Other Ambulatory Visit: Payer: Self-pay

## 2017-08-02 ENCOUNTER — Emergency Department (HOSPITAL_COMMUNITY): Payer: Medicare Other

## 2017-08-02 ENCOUNTER — Inpatient Hospital Stay (HOSPITAL_COMMUNITY): Payer: Medicare Other

## 2017-08-02 ENCOUNTER — Encounter (HOSPITAL_COMMUNITY): Payer: Self-pay | Admitting: Emergency Medicine

## 2017-08-02 DIAGNOSIS — N390 Urinary tract infection, site not specified: Secondary | ICD-10-CM | POA: Diagnosis present

## 2017-08-02 DIAGNOSIS — Z880 Allergy status to penicillin: Secondary | ICD-10-CM

## 2017-08-02 DIAGNOSIS — S79912A Unspecified injury of left hip, initial encounter: Secondary | ICD-10-CM | POA: Diagnosis not present

## 2017-08-02 DIAGNOSIS — R296 Repeated falls: Secondary | ICD-10-CM | POA: Diagnosis present

## 2017-08-02 DIAGNOSIS — N179 Acute kidney failure, unspecified: Secondary | ICD-10-CM | POA: Diagnosis present

## 2017-08-02 DIAGNOSIS — Z79890 Hormone replacement therapy (postmenopausal): Secondary | ICD-10-CM

## 2017-08-02 DIAGNOSIS — E038 Other specified hypothyroidism: Secondary | ICD-10-CM | POA: Diagnosis not present

## 2017-08-02 DIAGNOSIS — G4089 Other seizures: Secondary | ICD-10-CM | POA: Diagnosis present

## 2017-08-02 DIAGNOSIS — T07XXXA Unspecified multiple injuries, initial encounter: Secondary | ICD-10-CM

## 2017-08-02 DIAGNOSIS — R279 Unspecified lack of coordination: Secondary | ICD-10-CM | POA: Diagnosis not present

## 2017-08-02 DIAGNOSIS — M810 Age-related osteoporosis without current pathological fracture: Secondary | ICD-10-CM | POA: Diagnosis present

## 2017-08-02 DIAGNOSIS — G939 Disorder of brain, unspecified: Secondary | ICD-10-CM | POA: Diagnosis not present

## 2017-08-02 DIAGNOSIS — M25552 Pain in left hip: Secondary | ICD-10-CM | POA: Diagnosis not present

## 2017-08-02 DIAGNOSIS — R569 Unspecified convulsions: Secondary | ICD-10-CM | POA: Diagnosis not present

## 2017-08-02 DIAGNOSIS — E039 Hypothyroidism, unspecified: Secondary | ICD-10-CM | POA: Diagnosis present

## 2017-08-02 DIAGNOSIS — S0990XA Unspecified injury of head, initial encounter: Secondary | ICD-10-CM | POA: Diagnosis not present

## 2017-08-02 DIAGNOSIS — R262 Difficulty in walking, not elsewhere classified: Secondary | ICD-10-CM | POA: Diagnosis not present

## 2017-08-02 DIAGNOSIS — Z9181 History of falling: Secondary | ICD-10-CM

## 2017-08-02 DIAGNOSIS — M6281 Muscle weakness (generalized): Secondary | ICD-10-CM | POA: Diagnosis not present

## 2017-08-02 DIAGNOSIS — R269 Unspecified abnormalities of gait and mobility: Secondary | ICD-10-CM | POA: Diagnosis not present

## 2017-08-02 DIAGNOSIS — C719 Malignant neoplasm of brain, unspecified: Secondary | ICD-10-CM | POA: Diagnosis present

## 2017-08-02 DIAGNOSIS — Z90710 Acquired absence of both cervix and uterus: Secondary | ICD-10-CM | POA: Diagnosis not present

## 2017-08-02 DIAGNOSIS — J9811 Atelectasis: Secondary | ICD-10-CM | POA: Diagnosis not present

## 2017-08-02 DIAGNOSIS — H3530 Unspecified macular degeneration: Secondary | ICD-10-CM | POA: Diagnosis present

## 2017-08-02 DIAGNOSIS — E118 Type 2 diabetes mellitus with unspecified complications: Secondary | ICD-10-CM | POA: Diagnosis not present

## 2017-08-02 DIAGNOSIS — R404 Transient alteration of awareness: Secondary | ICD-10-CM | POA: Diagnosis not present

## 2017-08-02 DIAGNOSIS — E1165 Type 2 diabetes mellitus with hyperglycemia: Secondary | ICD-10-CM | POA: Diagnosis present

## 2017-08-02 DIAGNOSIS — M79605 Pain in left leg: Secondary | ICD-10-CM | POA: Diagnosis not present

## 2017-08-02 DIAGNOSIS — Z803 Family history of malignant neoplasm of breast: Secondary | ICD-10-CM | POA: Diagnosis not present

## 2017-08-02 DIAGNOSIS — Z79899 Other long term (current) drug therapy: Secondary | ICD-10-CM | POA: Diagnosis not present

## 2017-08-02 DIAGNOSIS — E860 Dehydration: Secondary | ICD-10-CM

## 2017-08-02 DIAGNOSIS — S199XXA Unspecified injury of neck, initial encounter: Secondary | ICD-10-CM | POA: Diagnosis not present

## 2017-08-02 DIAGNOSIS — I10 Essential (primary) hypertension: Secondary | ICD-10-CM | POA: Diagnosis not present

## 2017-08-02 DIAGNOSIS — S9001XA Contusion of right ankle, initial encounter: Secondary | ICD-10-CM | POA: Diagnosis not present

## 2017-08-02 DIAGNOSIS — M170 Bilateral primary osteoarthritis of knee: Secondary | ICD-10-CM | POA: Diagnosis present

## 2017-08-02 DIAGNOSIS — N281 Cyst of kidney, acquired: Secondary | ICD-10-CM | POA: Diagnosis not present

## 2017-08-02 DIAGNOSIS — E871 Hypo-osmolality and hyponatremia: Secondary | ICD-10-CM | POA: Diagnosis present

## 2017-08-02 DIAGNOSIS — W010XXA Fall on same level from slipping, tripping and stumbling without subsequent striking against object, initial encounter: Secondary | ICD-10-CM | POA: Diagnosis present

## 2017-08-02 DIAGNOSIS — R531 Weakness: Secondary | ICD-10-CM | POA: Diagnosis not present

## 2017-08-02 DIAGNOSIS — S0083XA Contusion of other part of head, initial encounter: Secondary | ICD-10-CM | POA: Diagnosis not present

## 2017-08-02 DIAGNOSIS — S99912A Unspecified injury of left ankle, initial encounter: Secondary | ICD-10-CM | POA: Diagnosis not present

## 2017-08-02 DIAGNOSIS — S8011XD Contusion of right lower leg, subsequent encounter: Secondary | ICD-10-CM | POA: Diagnosis not present

## 2017-08-02 DIAGNOSIS — S9002XA Contusion of left ankle, initial encounter: Secondary | ICD-10-CM | POA: Diagnosis not present

## 2017-08-02 DIAGNOSIS — Y92009 Unspecified place in unspecified non-institutional (private) residence as the place of occurrence of the external cause: Secondary | ICD-10-CM

## 2017-08-02 DIAGNOSIS — M549 Dorsalgia, unspecified: Secondary | ICD-10-CM | POA: Diagnosis present

## 2017-08-02 DIAGNOSIS — C713 Malignant neoplasm of parietal lobe: Secondary | ICD-10-CM | POA: Diagnosis not present

## 2017-08-02 DIAGNOSIS — W010XXS Fall on same level from slipping, tripping and stumbling without subsequent striking against object, sequela: Secondary | ICD-10-CM | POA: Diagnosis not present

## 2017-08-02 DIAGNOSIS — E213 Hyperparathyroidism, unspecified: Secondary | ICD-10-CM | POA: Diagnosis not present

## 2017-08-02 DIAGNOSIS — M25571 Pain in right ankle and joints of right foot: Secondary | ICD-10-CM | POA: Diagnosis not present

## 2017-08-02 DIAGNOSIS — E119 Type 2 diabetes mellitus without complications: Secondary | ICD-10-CM | POA: Diagnosis not present

## 2017-08-02 DIAGNOSIS — M25561 Pain in right knee: Secondary | ICD-10-CM | POA: Diagnosis not present

## 2017-08-02 DIAGNOSIS — W19XXXA Unspecified fall, initial encounter: Secondary | ICD-10-CM

## 2017-08-02 DIAGNOSIS — Z886 Allergy status to analgesic agent status: Secondary | ICD-10-CM

## 2017-08-02 DIAGNOSIS — G9389 Other specified disorders of brain: Secondary | ICD-10-CM

## 2017-08-02 DIAGNOSIS — M25572 Pain in left ankle and joints of left foot: Secondary | ICD-10-CM | POA: Diagnosis not present

## 2017-08-02 DIAGNOSIS — R4182 Altered mental status, unspecified: Secondary | ICD-10-CM | POA: Diagnosis not present

## 2017-08-02 DIAGNOSIS — G40109 Localization-related (focal) (partial) symptomatic epilepsy and epileptic syndromes with simple partial seizures, not intractable, without status epilepticus: Secondary | ICD-10-CM | POA: Diagnosis not present

## 2017-08-02 DIAGNOSIS — B9689 Other specified bacterial agents as the cause of diseases classified elsewhere: Secondary | ICD-10-CM | POA: Diagnosis present

## 2017-08-02 DIAGNOSIS — IMO0002 Reserved for concepts with insufficient information to code with codable children: Secondary | ICD-10-CM | POA: Diagnosis present

## 2017-08-02 DIAGNOSIS — R259 Unspecified abnormal involuntary movements: Secondary | ICD-10-CM | POA: Diagnosis not present

## 2017-08-02 DIAGNOSIS — G8929 Other chronic pain: Secondary | ICD-10-CM | POA: Diagnosis present

## 2017-08-02 DIAGNOSIS — W01198D Fall on same level from slipping, tripping and stumbling with subsequent striking against other object, subsequent encounter: Secondary | ICD-10-CM | POA: Diagnosis not present

## 2017-08-02 LAB — CBC WITH DIFFERENTIAL/PLATELET
Basophils Absolute: 0.1 10*3/uL (ref 0.0–0.1)
Basophils Relative: 0 %
Eosinophils Absolute: 0.1 10*3/uL (ref 0.0–0.7)
Eosinophils Relative: 0 %
HEMATOCRIT: 35.7 % — AB (ref 36.0–46.0)
HEMOGLOBIN: 11.6 g/dL — AB (ref 12.0–15.0)
LYMPHS ABS: 2.6 10*3/uL (ref 0.7–4.0)
LYMPHS PCT: 18 %
MCH: 31.4 pg (ref 26.0–34.0)
MCHC: 32.5 g/dL (ref 30.0–36.0)
MCV: 96.7 fL (ref 78.0–100.0)
MONOS PCT: 7 %
Monocytes Absolute: 1 10*3/uL (ref 0.1–1.0)
NEUTROS ABS: 10.6 10*3/uL — AB (ref 1.7–7.7)
NEUTROS PCT: 75 %
Platelets: 287 10*3/uL (ref 150–400)
RBC: 3.69 MIL/uL — AB (ref 3.87–5.11)
RDW: 13.8 % (ref 11.5–15.5)
WBC: 14.3 10*3/uL — AB (ref 4.0–10.5)

## 2017-08-02 LAB — URINALYSIS, ROUTINE W REFLEX MICROSCOPIC
BILIRUBIN URINE: NEGATIVE
Glucose, UA: NEGATIVE mg/dL
HGB URINE DIPSTICK: NEGATIVE
Ketones, ur: NEGATIVE mg/dL
NITRITE: POSITIVE — AB
PROTEIN: NEGATIVE mg/dL
Specific Gravity, Urine: 1.01 (ref 1.005–1.030)
pH: 7 (ref 5.0–8.0)

## 2017-08-02 LAB — BASIC METABOLIC PANEL
Anion gap: 9 (ref 5–15)
BUN: 31 mg/dL — AB (ref 6–20)
CO2: 24 mmol/L (ref 22–32)
CREATININE: 1.12 mg/dL — AB (ref 0.44–1.00)
Calcium: 10.5 mg/dL — ABNORMAL HIGH (ref 8.9–10.3)
Chloride: 105 mmol/L (ref 101–111)
GFR calc Af Amer: 52 mL/min — ABNORMAL LOW (ref >60)
GFR calc non Af Amer: 44 mL/min — ABNORMAL LOW (ref >60)
Glucose, Bld: 90 mg/dL (ref 65–99)
POTASSIUM: 4.3 mmol/L (ref 3.5–5.1)
SODIUM: 138 mmol/L (ref 135–145)

## 2017-08-02 LAB — TSH: TSH: 3.14 u[IU]/mL (ref 0.350–4.500)

## 2017-08-02 MED ORDER — HYDROCODONE-ACETAMINOPHEN 5-325 MG PO TABS
1.0000 | ORAL_TABLET | Freq: Four times a day (QID) | ORAL | Status: DC | PRN
  Administered 2017-08-02: 1 via ORAL
  Filled 2017-08-02: qty 1

## 2017-08-02 MED ORDER — SODIUM CHLORIDE 0.9 % IV SOLN
Freq: Once | INTRAVENOUS | Status: AC
  Administered 2017-08-02: 11:00:00 via INTRAVENOUS

## 2017-08-02 MED ORDER — SODIUM CHLORIDE 0.9 % IV SOLN
INTRAVENOUS | Status: DC
  Administered 2017-08-02 – 2017-08-03 (×3): via INTRAVENOUS

## 2017-08-02 MED ORDER — HYDRALAZINE HCL 20 MG/ML IJ SOLN: 10.0000 mg | INTRAMUSCULAR | Status: DC | PRN

## 2017-08-02 MED ORDER — IOPAMIDOL (ISOVUE-300) INJECTION 61%
75.0000 mL | Freq: Once | INTRAVENOUS | Status: AC | PRN
  Administered 2017-08-02: 18:00:00 via INTRAVENOUS

## 2017-08-02 MED ORDER — ONDANSETRON HCL 4 MG PO TABS: 4.0000 mg | ORAL_TABLET | Freq: Four times a day (QID) | ORAL | Status: DC | PRN

## 2017-08-02 MED ORDER — ACETAMINOPHEN 325 MG PO TABS
ORAL_TABLET | ORAL | Status: AC
  Administered 2017-08-04: 650 mg via ORAL
  Filled 2017-08-02: qty 2

## 2017-08-02 MED ORDER — LEVETIRACETAM IN NACL 500 MG/100ML IV SOLN
500.0000 mg | Freq: Once | INTRAVENOUS | Status: DC
  Filled 2017-08-02: qty 100

## 2017-08-02 MED ORDER — ACETAMINOPHEN 325 MG PO TABS
650.0000 mg | ORAL_TABLET | Freq: Four times a day (QID) | ORAL | Status: DC | PRN
  Administered 2017-08-02 – 2017-08-04 (×2): 650 mg via ORAL
  Filled 2017-08-02: qty 2

## 2017-08-02 MED ORDER — ACETAMINOPHEN 650 MG RE SUPP: 650.0000 mg | Freq: Four times a day (QID) | RECTAL | Status: DC | PRN

## 2017-08-02 MED ORDER — LEVOFLOXACIN IN D5W 500 MG/100ML IV SOLN
500.0000 mg | INTRAVENOUS | Status: DC
  Administered 2017-08-03 – 2017-08-05 (×3): 500 mg via INTRAVENOUS
  Filled 2017-08-02 (×3): qty 100

## 2017-08-02 MED ORDER — AMLODIPINE BESYLATE 5 MG PO TABS
2.5000 mg | ORAL_TABLET | Freq: Every day | ORAL | Status: DC
  Administered 2017-08-02 – 2017-08-05 (×4): 2.5 mg via ORAL
  Filled 2017-08-02 (×4): qty 1

## 2017-08-02 MED ORDER — LEVETIRACETAM IN NACL 500 MG/100ML IV SOLN
500.0000 mg | Freq: Two times a day (BID) | INTRAVENOUS | Status: DC
  Administered 2017-08-02 – 2017-08-05 (×6): 500 mg via INTRAVENOUS
  Filled 2017-08-02 (×6): qty 100

## 2017-08-02 MED ORDER — ZOLEDRONIC ACID 4 MG/5ML IV CONC
3.3000 mg | Freq: Once | INTRAVENOUS | Status: AC
  Administered 2017-08-02: 3.3 mg via INTRAVENOUS
  Filled 2017-08-02: qty 4.13

## 2017-08-02 MED ORDER — LEVOTHYROXINE SODIUM 75 MCG PO TABS
150.0000 ug | ORAL_TABLET | Freq: Every day | ORAL | Status: DC
  Administered 2017-08-03 – 2017-08-05 (×3): 150 ug via ORAL
  Filled 2017-08-02 (×2): qty 2
  Filled 2017-08-02: qty 6

## 2017-08-02 MED ORDER — LEVOFLOXACIN IN D5W 750 MG/150ML IV SOLN
750.0000 mg | Freq: Once | INTRAVENOUS | Status: AC
  Administered 2017-08-02: 750 mg via INTRAVENOUS
  Filled 2017-08-02: qty 150

## 2017-08-02 MED ORDER — SODIUM CHLORIDE 0.9 % IV SOLN
1.0000 g | Freq: Once | INTRAVENOUS | Status: AC
  Administered 2017-08-02: 1 g via INTRAVENOUS
  Filled 2017-08-02: qty 10

## 2017-08-02 MED ORDER — CARVEDILOL 3.125 MG PO TABS
6.2500 mg | ORAL_TABLET | Freq: Two times a day (BID) | ORAL | Status: DC
  Administered 2017-08-02 – 2017-08-05 (×6): 6.25 mg via ORAL
  Filled 2017-08-02 (×7): qty 2

## 2017-08-02 MED ORDER — SENNOSIDES-DOCUSATE SODIUM 8.6-50 MG PO TABS: 1.0000 | ORAL_TABLET | Freq: Every evening | ORAL | Status: DC | PRN

## 2017-08-02 MED ORDER — IOPAMIDOL (ISOVUE-300) INJECTION 61%
30.0000 mL | Freq: Once | INTRAVENOUS | Status: AC | PRN
  Administered 2017-08-02: 30 mL via ORAL

## 2017-08-02 MED ORDER — ENOXAPARIN SODIUM 30 MG/0.3ML ~~LOC~~ SOLN
30.0000 mg | SUBCUTANEOUS | Status: DC
  Administered 2017-08-02: 30 mg via SUBCUTANEOUS
  Filled 2017-08-02: qty 0.3

## 2017-08-02 MED ORDER — ONDANSETRON HCL 4 MG/2ML IJ SOLN: 4.0000 mg | Freq: Four times a day (QID) | INTRAMUSCULAR | Status: DC | PRN

## 2017-08-02 MED ORDER — ZOLEDRONIC ACID 4 MG/5ML IV CONC
INTRAVENOUS | Status: AC
  Filled 2017-08-02: qty 5

## 2017-08-02 NOTE — ED Notes (Signed)
Pt placed on purewick 

## 2017-08-02 NOTE — H&P (Signed)
History and Physical  BAILEA BEED MVH:[id number] DOB: 09-21-1932 DOA: 08/02/2017  Referring physician: Sabra Heck PCP: Kathyrn Drown, MD   Chief Complaint: Fall   HPI: Cynthia Marks is a 84 y.o. female who has been becoming increasingly debilitated and weak over the past several months presented to the emergency department after an another fall at home.  She had been seen last week in the ED for another fall.  The patient did not have any acute fractures at that time and was sent home.  She has been following up outpatient and they have been making arrangements for her to have assistance at home.  The patient continues to report that she loses her balance.  With the current episode she reports that she lost her balance on her walker and fell at home she does not know if she hit her head or not.  She complained of initial pain in the left side of the head, bilateral ankles, right hip and left hip.  She also has been concerned about some spontaneous twitching episodes on the left side of her body that she does not seem to be able to control.  She denies shortness of breath and chest pain.  She lives with her husband at home but he is frail and unable to assist her.  ED course: The patient had x-rays and imaging done in no acute findings of fracture was found on any of the imaging studies.  However, the CT of the brain was suspicious for a new finding of a right parietal lesion with surrounding vasogenic edema noted.  An MRI was recommended.  The patient was noted to have some hypercalcemia which is not new for her.  She had seen endocrinology recently and was diagnosed with hyperparathyroidism.  She has hypothyroidism that has been stable.  She was noted to have a UTI and leukocytosis.  She is being admitted for further evaluation and management and is likely not going to be able to return home given her progressive frailty and frequent fall.   Review of Systems: All systems reviewed and  apart from history of presenting illness, are negative.  Past Medical History:  Diagnosis Date  . Arthritis    Knees  . Chronic back pain   . Chronic pain of right knee   . Diabetes mellitus   . Gait abnormality   . Hypertension   . Macular degeneration   . Thyroid disease    Past Surgical History:  Procedure Laterality Date  . ABDOMINAL HYSTERECTOMY    . COLONOSCOPY    . SHOULDER SURGERY     Social History:  reports that she has never smoked. She has never used smokeless tobacco. She reports that she does not drink alcohol or use drugs.  Allergies  Allergen Reactions  . Aspirin   . Ibuprofen     Nausea or HTN  . Penicillins Rash    Has patient had a PCN reaction causing immediate rash, facial/tongue/throat swelling, SOB or lightheadedness with hypotension: unknown Has patient had a PCN reaction causing severe rash involving mucus membranes or skin necrosis: unknown Has patient had a PCN reaction that required hospitalization: no Has patient had a PCN reaction occurring within the last 10 years: No If all of the above answers are "NO", then may proceed with Cephalosporin use.     Family History  Problem Relation Age of Onset  . Heart disease Unknown   . Arthritis Unknown   . Cancer Unknown   .  Diabetes Unknown   . Kidney disease Unknown     Prior to Admission medications   Medication Sig Start Date End Date Taking? Authorizing Provider  acetaminophen (TYLENOL) 650 MG CR tablet Take 650 mg by mouth every 8 (eight) hours as needed for pain.    Yes [provider]  amLODipine (NORVASC) 2.5 MG tablet TAKE 1 TABLET BY MOUTH EVERY DAY 07/15/17  Yes Luking, Scott A, MD  beta carotene w/minerals (OCUVITE) tablet Take 1 tablet by mouth daily. Reported on 07/24/2015   Yes [provider]  carvedilol (COREG) 12.5 MG tablet Take 1/2 tablet by mouth twice daily. 01/09/17  Yes Luking, Scott A, MD  [employer] D3 5000 units capsule TAKE 1 CAPSULE (5,000 UNITS TOTAL) BY  MOUTH DAILY. 04/21/17  Yes Cassandria Anger, MD  levothyroxine (SYNTHROID, LEVOTHROID) 150 MCG tablet Take 2 tablets on mon, wed, and Friday. Take one and a half tablets on tues, thurs, sat, and sun Patient taking differently: Take 150 mcg by mouth daily before breakfast.  01/09/17  Yes Kathyrn Drown, MD   Physical Exam: Vitals:   08/02/17 0830 08/02/17 0900 08/02/17 1000 08/02/17 1030  BP: (!) 163/95 (!) 151/86 (!) 176/110 (!) 169/98  Pulse: 78 71 94 88  Resp: (!) 21 15 (!) 31 (!) 26  Temp:      TempSrc:      SpO2: 99% 99% 100% 100%  Weight:      Height:         General exam: Chronically ill-appearing.  Moderately built and nourished patient, lying comfortably supine on the gurney in no obvious distress.  Head, eyes and ENT: Nontraumatic and normocephalic. Pupils equally reacting to light and accommodation. Oral mucosa dry.  Neck: Supple. No JVD, carotid bruit or thyromegaly.  Lymphatics: No lymphadenopathy.  Respiratory system: Clear to auscultation. No increased work of breathing.  Cardiovascular system: S1 and S2 heard, RRR. No JVD, murmurs, gallops, clicks or pedal edema.  Gastrointestinal system: Abdomen is nondistended, soft and nontender. Normal bowel sounds heard. No organomegaly or masses appreciated.  Central nervous system: Alert and oriented. No focal neurological deficits.  Extremities: Symmetric 5 x 5 power.  Bilateral ankle pain and left knee pain.  Mild edema noted.  Peripheral pulses symmetrically felt.   Skin: No rashes or acute findings.  Musculoskeletal system: Negative exam.  Psychiatry: Pleasant and cooperative.  Labs on Admission:  Basic Metabolic Panel: Recent Labs  Lab 08/02/17 1159  NA 138  K 4.3  CL 105  CO2 24  GLUCOSE 90  BUN 31*  CREATININE 1.12*  CALCIUM 10.5*   Liver Function Tests: No results for input(s): AST, ALT, ALKPHOS, BILITOT, PROT, ALBUMIN in the last 168 hours. No results for input(s): LIPASE, AMYLASE in the  last 168 hours. No results for input(s): AMMONIA in the last 168 hours. CBC: Recent Labs  Lab 08/02/17 1159  WBC 14.3*  NEUTROABS 10.6*  HGB 11.6*  HCT 35.7*  MCV 96.7  PLT 287   Cardiac Enzymes: No results for input(s): CKTOTAL, CKMB, CKMBINDEX, TROPONINI in the last 168 hours.  BNP (last 3 results) No results for input(s): PROBNP in the last 8760 hours. CBG: No results for input(s): GLUCAP in the last 168 hours.  Radiological Exams on Admission: Dg Ankle Complete Left  Result Date: 08/02/2017 CLINICAL DATA:  Fall in bathroom with left ankle pain. EXAM: LEFT ANKLE COMPLETE - 3+ VIEW COMPARISON:  None. FINDINGS: Mild soft tissue swelling over the ankle. Ankle mortise is within  normal. No evidence of acute fracture or dislocation. IMPRESSION: No acute fracture. Electronically Signed   By: Marin Olp M.D.   On: 08/02/2017 08:47   Dg Ankle Complete Right  Result Date: 08/02/2017 CLINICAL DATA:  Fall in bathroom with right ankle pain. EXAM: RIGHT ANKLE - COMPLETE 3+ VIEW COMPARISON:  07/22/2017 FINDINGS: Examination demonstrates minimal soft tissue swelling over the ankle. No evidence of acute fracture or dislocation. Old healed distal fibular and medial malleolar fractures. Slight stable widening of the medial aspect of the ankle mortise. Small inferior calcaneal spur. IMPRESSION: No acute fracture. Old bimalleolar fractures and stable widening of the medial aspect of the ankle mortise. Electronically Signed   By: Marin Olp M.D.   On: 08/02/2017 08:46   Ct Head Wo Contrast  Result Date: 08/02/2017 CLINICAL DATA:  Fall.  Hit left side of head. EXAM: CT HEAD WITHOUT CONTRAST CT CERVICAL SPINE WITHOUT CONTRAST TECHNIQUE: Multidetector CT imaging of the head and cervical spine was performed following the standard protocol without intravenous contrast. Multiplanar CT image reconstructions of the cervical spine were also generated. COMPARISON:  CT head dated December 04, 2011.  FINDINGS: CT HEAD FINDINGS Brain: There is new hypodensity within the right parietal lobe surrounding a questionable mass lesion (series 4, image 52; series 5, image 25). There is mild effacement of the adjacent sulci. No evidence of acute infarction, hemorrhage, hydrocephalus, or extra-axial collection. Stable mild cerebral atrophy and chronic microvascular ischemic changes. Vascular: Atherosclerotic vascular calcification of the carotid siphons. No hyperdense vessel. Skull: Negative for fracture or focal lesion. Sinuses/Orbits: No acute finding. Other: None. CT CERVICAL SPINE FINDINGS Alignment: Normal. Skull base and vertebrae: No acute fracture. No primary bone lesion or focal pathologic process. Soft tissues and spinal canal: No prevertebral fluid or swelling. No visible canal hematoma. Disc levels: Mild disc height loss from C4-C5 through C6-C7. Moderate uncovertebral hypertrophy at C5-C6 and C6-C7. Severe facet arthropathy throughout the cervical spine with fusion of the right C4-C5 and left C3-C4 facet joints. Upper chest: Negative. Other: None. IMPRESSION: 1. New questionable mass lesion in the right parietal lobe with surrounding vasogenic edema. Recommend contrast-enhanced MRI of the brain for further evaluation. 2. No evidence of traumatic injury within the head or cervical spine. Electronically Signed   By: Titus Dubin M.D.   On: 08/02/2017 08:29   Ct Cervical Spine Wo Contrast  Result Date: 08/02/2017 CLINICAL DATA:  Fall.  Hit left side of head. EXAM: CT HEAD WITHOUT CONTRAST CT CERVICAL SPINE WITHOUT CONTRAST TECHNIQUE: Multidetector CT imaging of the head and cervical spine was performed following the standard protocol without intravenous contrast. Multiplanar CT image reconstructions of the cervical spine were also generated. COMPARISON:  CT head dated December 04, 2011. FINDINGS: CT HEAD FINDINGS Brain: There is new hypodensity within the right parietal lobe surrounding a questionable  mass lesion (series 4, image 52; series 5, image 25). There is mild effacement of the adjacent sulci. No evidence of acute infarction, hemorrhage, hydrocephalus, or extra-axial collection. Stable mild cerebral atrophy and chronic microvascular ischemic changes. Vascular: Atherosclerotic vascular calcification of the carotid siphons. No hyperdense vessel. Skull: Negative for fracture or focal lesion. Sinuses/Orbits: No acute finding. Other: None. CT CERVICAL SPINE FINDINGS Alignment: Normal. Skull base and vertebrae: No acute fracture. No primary bone lesion or focal pathologic process. Soft tissues and spinal canal: No prevertebral fluid or swelling. No visible canal hematoma. Disc levels: Mild disc height loss from C4-C5 through C6-C7. Moderate uncovertebral hypertrophy at C5-C6 and C6-C7.  Severe facet arthropathy throughout the cervical spine with fusion of the right C4-C5 and left C3-C4 facet joints. Upper chest: Negative. Other: None. IMPRESSION: 1. New questionable mass lesion in the right parietal lobe with surrounding vasogenic edema. Recommend contrast-enhanced MRI of the brain for further evaluation. 2. No evidence of traumatic injury within the head or cervical spine. Electronically Signed   By: Titus Dubin M.D.   On: 08/02/2017 08:29   Dg Knee Complete 4 Views Right  Result Date: 08/02/2017 CLINICAL DATA:  Fall in bathroom with right knee pain. EXAM: RIGHT KNEE - COMPLETE 4+ VIEW COMPARISON:  None. FINDINGS: Mild diffuse osteopenia. Mild tricompartmental osteoarthritic change worse over the lateral compartment. No evidence of acute fracture or dislocation. Small joint effusion is present. IMPRESSION: No acute fracture. Small joint effusion. Mild tricompartmental osteoarthritis. Electronically Signed   By: Marin Olp M.D.   On: 08/02/2017 08:45   Dg Hip Unilat W Or Wo Pelvis 2-3 Views Left  Result Date: 08/02/2017 CLINICAL DATA:  Fall in bathroom with left-sided pain. EXAM: DG HIP (WITH OR  WITHOUT PELVIS) 2-3V LEFT COMPARISON:  07/22/2017 FINDINGS: Mild diffuse osteopenia. Mild symmetric degenerative change of the hips. No evidence of acute fracture or dislocation. There are degenerative changes of the spine and sacroiliac joints. IMPRESSION: No acute fracture. Electronically Signed   By: Marin Olp M.D.   On: 08/02/2017 08:49    EKG: Personally reviewed. Normal sinus rhythm.   Assessment/Plan Principal Problem:   Hypercalcemia of malignancy Active Problems:   Lesion of parietal lobe of brain   Gait abnormality   Dehydration   Hyponatremia   ARF (acute renal failure) ([place])   Uncontrolled type 2 diabetes mellitus with complication, without long-term current use of insulin ([facility])   HTN (hypertension)   Hypothyroidism   Osteoporosis   Hyperparathyroidism ([place])   UTI (urinary tract infection)   At maximum risk for fall   Convulsions ([place])  1. New lesion of the right parietal brain-concerning for malignancy.  Given the convulsions involving the left side of her body I am concerned that she is having partial seizures.  Obtain MRI of brain for further evaluation.  Likely will need an oncology follow-up.  Also further testing with CT of chest abdomen and pelvis check for primary cancer source. 2. Acute renal failure-we will treat with IV fluid hydration and repeat BMP in the morning. 3. UTI-obtain urine culture, treat with levofloxacin. 4. Hypothyroidism-has been stable and was recently seen by endocrinology, continue levothyroxine 150 mcg daily. 5. Convulsions-involving the left side of the body concerning that this is a result of the parietal lesion.  I am going to treat her with Keppra to prevent these episodes. 6. Hyperparathyroidism-patient is being followed by Dr. Dorris Fetch for this. 7. Hypercalcemia of malignancy- renally dosed Zometa has been ordered.  Continue IV fluid hydration.  Recheck BMP in the morning. 8. Diabetes mellitus, type 2-has been well controlled with diet.   She has been taken off her Januvia by endocrinology as her A1c has been 5.3%. 9. Frequent falls- likely this is multifactorial given the above findings, PT evaluation has been requested and I do suspect that patient likely is going to need rehab placement. 10. Essential hypertension-resume home blood pressure medications and follow closely. 11. Leukocytosis-likely secondary to UTI and possibly reactive from convulsions.  DVT Prophylaxis: Lovenox Code Status: Full Family Communication: At bedside Disposition Plan: Continue inpatient care and work-up, treatment, suspect will need placement or skilled nursing care  Time spent: 62-minutes  Irwin Brakeman, MD [facility] Pager [phone]  If 7PM-7AM, please contact night-coverage [email or web address] Password [email or web address] 08/02/2017, 1:53 PM

## 2017-08-02 NOTE — ED Provider Notes (Addendum)
[facility] EMERGENCY DEPARTMENT Provider Note   CSN: [id number] Arrival date & time: 08/02/17  9892     History   Chief Complaint Chief Complaint  Patient presents with  . Fall    HPI Cynthia Marks is a 84 y.o. female.  Level 5 caveat for urgent need for intervention.  Patient was attempting to use the bathroom this morning when she lost her balance on her walker and fell.  She complains of pain in the left side of her head, bilateral ankles, right knee, left hip.  She notes report a concern of some "twitching" on her left side.  She states she is unable to take care of herself and needs to be admitted to the hospital.     Past Medical History:  Diagnosis Date  . Arthritis    Knees  . Chronic back pain   . Chronic pain of right knee   . Diabetes mellitus   . Gait abnormality   . Hypertension   . Macular degeneration   . Thyroid disease     Patient Active Problem List   Diagnosis Date Noted  . Hypercalcemia of malignancy 08/02/2017  . Lesion of parietal lobe of brain 08/02/2017  . UTI (urinary tract infection) 08/02/2017  . At maximum risk for fall 08/02/2017  . Convulsions ([place]) 08/02/2017  . Hyperparathyroidism ([place]) 11/25/2016  . Hypercalcemia 08/09/2016  . Osteoporosis 10/08/2013  . Hyperlipemia 11/30/2012  . Arthritis of knee, degenerative 09/23/2012  . Knee bursitis 09/23/2012  . Bacteremia 08/13/2012  . Acute pyelonephritis 08/11/2012  . Dehydration 08/11/2012  . Hyponatremia 08/11/2012  . ARF (acute renal failure) ([place]) 08/11/2012  . Uncontrolled type 2 diabetes mellitus with complication, without long-term current use of insulin ([place]) 08/11/2012  . HTN (hypertension) 08/11/2012  . Hypothyroidism 08/11/2012  . Gait abnormality 04/22/2012  . Ankle fracture, bimalleolar, closed 12/10/2011  . Skin breakdown 12/10/2011    Past Surgical History:  Procedure Laterality Date  . ABDOMINAL HYSTERECTOMY    . COLONOSCOPY    . SHOULDER SURGERY        OB History   None      Home Medications    Prior to Admission medications   Medication Sig Start Date End Date Taking? Authorizing Provider  acetaminophen (TYLENOL) 650 MG CR tablet Take 650 mg by mouth every 8 (eight) hours as needed for pain.    Yes [provider]  amLODipine (NORVASC) 2.5 MG tablet TAKE 1 TABLET BY MOUTH EVERY DAY 07/15/17  Yes Luking, Scott A, MD  beta carotene w/minerals (OCUVITE) tablet Take 1 tablet by mouth daily. Reported on 07/24/2015   Yes [provider]  carvedilol (COREG) 12.5 MG tablet Take 1/2 tablet by mouth twice daily. 01/09/17  Yes Luking, Scott A, MD  [employer] D3 5000 units capsule TAKE 1 CAPSULE (5,000 UNITS TOTAL) BY MOUTH DAILY. 04/21/17  Yes Cassandria Anger, MD  levothyroxine (SYNTHROID, LEVOTHROID) 150 MCG tablet Take 2 tablets on mon, wed, and Friday. Take one and a half tablets on tues, thurs, sat, and sun Patient taking differently: Take 150 mcg by mouth daily before breakfast.  01/09/17  Yes Luking, Elayne Snare, MD    Family History Family History  Problem Relation Age of Onset  . Heart disease Unknown   . Arthritis Unknown   . Cancer Unknown   . Diabetes Unknown   . Kidney disease Unknown     Social History Social History   Tobacco Use  . Smoking status: Never Smoker  .  Smokeless tobacco: Never Used  Substance Use Topics  . Alcohol use: No  . Drug use: No     Allergies   Aspirin; Ibuprofen; and Penicillins   Review of Systems Review of Systems  Unable to perform ROS: Acuity of condition     Physical Exam Updated Vital Signs BP (!) 169/98   Pulse 88   Temp 97.7 F (36.5 C) (Oral)   Resp (!) 26   Ht 5\' 8"  (1.727 m)   Wt 90.7 kg (200 lb)   SpO2 100%   BMI 30.41 kg/m   Physical Exam  Constitutional: She is oriented to person, place, and time.  Pleasant, no gross neurological deficits.  HENT:  Head: Normocephalic and atraumatic.  Eyes: Conjunctivae are normal.  Neck: Neck supple.   Cardiovascular: Normal rate and regular rhythm.  Pulmonary/Chest: Effort normal and breath sounds normal.  Abdominal: Soft. Bowel sounds are normal.  Musculoskeletal:  Patient complains of generalized tenderness in both ankles, anterior left knee.  Neurological: She is alert and oriented to person, place, and time.  Skin: Skin is warm and dry.  Psychiatric: She has a normal mood and affect. Her behavior is normal.  Nursing note and vitals reviewed.    ED Treatments / Results  Labs (all labs ordered are listed, but only abnormal results are displayed) Labs Reviewed  CBC WITH DIFFERENTIAL/PLATELET - Abnormal; Notable for the following components:      Result Value   WBC 14.3 (*)    RBC 3.69 (*)    Hemoglobin 11.6 (*)    HCT 35.7 (*)    Neutro Abs 10.6 (*)    All other components within normal limits  BASIC METABOLIC PANEL - Abnormal; Notable for the following components:   BUN 31 (*)    Creatinine, Ser 1.12 (*)    Calcium 10.5 (*)    GFR calc non Af Amer 44 (*)    GFR calc Af Amer 52 (*)    All other components within normal limits  URINALYSIS, ROUTINE W REFLEX MICROSCOPIC - Abnormal; Notable for the following components:   APPearance HAZY (*)    Nitrite POSITIVE (*)    Leukocytes, UA SMALL (*)    Bacteria, UA RARE (*)    All other components within normal limits  URINE CULTURE    EKG EKG Interpretation  Date/Time:  Saturday Aug 02 2017 07:02:55 EDT Ventricular Rate:  83 PR Interval:    QRS Duration: 97 QT Interval:  377 QTC Calculation: 443 R Axis:   -13 Text Interpretation:  Sinus rhythm Confirmed by Nat Christen [phone]) on 08/02/2017 7:27:47 AM   Radiology Dg Ankle Complete Left  Result Date: 08/02/2017 CLINICAL DATA:  Fall in bathroom with left ankle pain. EXAM: LEFT ANKLE COMPLETE - 3+ VIEW COMPARISON:  None. FINDINGS: Mild soft tissue swelling over the ankle. Ankle mortise is within normal. No evidence of acute fracture or dislocation. IMPRESSION: No acute  fracture. Electronically Signed   By: Marin Olp M.D.   On: 08/02/2017 08:47   Dg Ankle Complete Right  Result Date: 08/02/2017 CLINICAL DATA:  Fall in bathroom with right ankle pain. EXAM: RIGHT ANKLE - COMPLETE 3+ VIEW COMPARISON:  07/22/2017 FINDINGS: Examination demonstrates minimal soft tissue swelling over the ankle. No evidence of acute fracture or dislocation. Old healed distal fibular and medial malleolar fractures. Slight stable widening of the medial aspect of the ankle mortise. Small inferior calcaneal spur. IMPRESSION: No acute fracture. Old bimalleolar fractures and stable widening of the medial  aspect of the ankle mortise. Electronically Signed   By: Marin Olp M.D.   On: 08/02/2017 08:46   Ct Head Wo Contrast  Result Date: 08/02/2017 CLINICAL DATA:  Fall.  Hit left side of head. EXAM: CT HEAD WITHOUT CONTRAST CT CERVICAL SPINE WITHOUT CONTRAST TECHNIQUE: Multidetector CT imaging of the head and cervical spine was performed following the standard protocol without intravenous contrast. Multiplanar CT image reconstructions of the cervical spine were also generated. COMPARISON:  CT head dated December 04, 2011. FINDINGS: CT HEAD FINDINGS Brain: There is new hypodensity within the right parietal lobe surrounding a questionable mass lesion (series 4, image 52; series 5, image 25). There is mild effacement of the adjacent sulci. No evidence of acute infarction, hemorrhage, hydrocephalus, or extra-axial collection. Stable mild cerebral atrophy and chronic microvascular ischemic changes. Vascular: Atherosclerotic vascular calcification of the carotid siphons. No hyperdense vessel. Skull: Negative for fracture or focal lesion. Sinuses/Orbits: No acute finding. Other: None. CT CERVICAL SPINE FINDINGS Alignment: Normal. Skull base and vertebrae: No acute fracture. No primary bone lesion or focal pathologic process. Soft tissues and spinal canal: No prevertebral fluid or swelling. No visible canal  hematoma. Disc levels: Mild disc height loss from C4-C5 through C6-C7. Moderate uncovertebral hypertrophy at C5-C6 and C6-C7. Severe facet arthropathy throughout the cervical spine with fusion of the right C4-C5 and left C3-C4 facet joints. Upper chest: Negative. Other: None. IMPRESSION: 1. New questionable mass lesion in the right parietal lobe with surrounding vasogenic edema. Recommend contrast-enhanced MRI of the brain for further evaluation. 2. No evidence of traumatic injury within the head or cervical spine. Electronically Signed   By: Titus Dubin M.D.   On: 08/02/2017 08:29   Ct Cervical Spine Wo Contrast  Result Date: 08/02/2017 CLINICAL DATA:  Fall.  Hit left side of head. EXAM: CT HEAD WITHOUT CONTRAST CT CERVICAL SPINE WITHOUT CONTRAST TECHNIQUE: Multidetector CT imaging of the head and cervical spine was performed following the standard protocol without intravenous contrast. Multiplanar CT image reconstructions of the cervical spine were also generated. COMPARISON:  CT head dated December 04, 2011. FINDINGS: CT HEAD FINDINGS Brain: There is new hypodensity within the right parietal lobe surrounding a questionable mass lesion (series 4, image 52; series 5, image 25). There is mild effacement of the adjacent sulci. No evidence of acute infarction, hemorrhage, hydrocephalus, or extra-axial collection. Stable mild cerebral atrophy and chronic microvascular ischemic changes. Vascular: Atherosclerotic vascular calcification of the carotid siphons. No hyperdense vessel. Skull: Negative for fracture or focal lesion. Sinuses/Orbits: No acute finding. Other: None. CT CERVICAL SPINE FINDINGS Alignment: Normal. Skull base and vertebrae: No acute fracture. No primary bone lesion or focal pathologic process. Soft tissues and spinal canal: No prevertebral fluid or swelling. No visible canal hematoma. Disc levels: Mild disc height loss from C4-C5 through C6-C7. Moderate uncovertebral hypertrophy at C5-C6 and  C6-C7. Severe facet arthropathy throughout the cervical spine with fusion of the right C4-C5 and left C3-C4 facet joints. Upper chest: Negative. Other: None. IMPRESSION: 1. New questionable mass lesion in the right parietal lobe with surrounding vasogenic edema. Recommend contrast-enhanced MRI of the brain for further evaluation. 2. No evidence of traumatic injury within the head or cervical spine. Electronically Signed   By: Titus Dubin M.D.   On: 08/02/2017 08:29   Dg Knee Complete 4 Views Right  Result Date: 08/02/2017 CLINICAL DATA:  Fall in bathroom with right knee pain. EXAM: RIGHT KNEE - COMPLETE 4+ VIEW COMPARISON:  None. FINDINGS: Mild diffuse osteopenia.  Mild tricompartmental osteoarthritic change worse over the lateral compartment. No evidence of acute fracture or dislocation. Small joint effusion is present. IMPRESSION: No acute fracture. Small joint effusion. Mild tricompartmental osteoarthritis. Electronically Signed   By: Marin Olp M.D.   On: 08/02/2017 08:45   Dg Hip Unilat W Or Wo Pelvis 2-3 Views Left  Result Date: 08/02/2017 CLINICAL DATA:  Fall in bathroom with left-sided pain. EXAM: DG HIP (WITH OR WITHOUT PELVIS) 2-3V LEFT COMPARISON:  07/22/2017 FINDINGS: Mild diffuse osteopenia. Mild symmetric degenerative change of the hips. No evidence of acute fracture or dislocation. There are degenerative changes of the spine and sacroiliac joints. IMPRESSION: No acute fracture. Electronically Signed   By: Marin Olp M.D.   On: 08/02/2017 08:49    Procedures Procedures (including critical care time)  Medications Ordered in ED Medications  cefTRIAXone (ROCEPHIN) 1 g in sodium chloride 0.9 % 100 mL IVPB (has no administration in time range)  levETIRAcetam (KEPPRA) IVPB 500 mg/100 mL premix (has no administration in time range)  0.9 %  sodium chloride infusion ( Intravenous New Bag/Given 08/02/17 1114)     Initial Impression / Assessment and Plan / ED Course  I have reviewed  the triage vital signs and the nursing notes.  Pertinent labs & imaging results that were available during my care of the patient were reviewed by me and considered in my medical decision making (see chart for details).     Patient presents with a fall this morning.  This is her second visit to the emergency department in the last 2 weeks for same. No obvious fractures.  She does have a mild urinary tract infection.  Additionally, there is a questionable mass noted in her right parietal region which could explain her "twitching".  Will start IV Rocephin, urine culture.  Admit to general medicine   CRITICAL CARE Performed by: Nat Christen Total critical care time: 35 minutes Critical care time was exclusive of separately billable procedures and treating other patients. Critical care was necessary to treat or prevent imminent or life-threatening deterioration. Critical care was time spent personally by me on the following activities: development of treatment plan with patient and/or surrogate as well as nursing, discussions with consultants, evaluation of patient's response to treatment, examination of patient, obtaining history from patient or surrogate, ordering and performing treatments and interventions, ordering and review of laboratory studies, ordering and review of radiographic studies, pulse oximetry and re-evaluation of patient's condition. Final Clinical Impressions(s) / ED Diagnoses   Final diagnoses:  Fall, initial encounter  Urinary tract infection without hematuria, site unspecified  Multiple contusions  Right parietal lobe mass    ED Discharge Orders    None       Nat Christen, MD 08/02/17 1347    Nat Christen, MD 08/02/17 [place]    Nat Christen, MD 08/02/17 1349

## 2017-08-02 NOTE — ED Notes (Signed)
Pt given ice pack for back pain.

## 2017-08-02 NOTE — ED Notes (Signed)
Pt and family are c/o that something is not right she states that her left side is twitting and she can not control it dr Lacinda Axon notified he states that he will go into see the patient.

## 2017-08-02 NOTE — ED Triage Notes (Signed)
Patient was attempting to use bathroom using walker and lost balance and struck left side of head, having complaints of right knee pain, left ankle and left side of body pain.

## 2017-08-02 NOTE — Progress Notes (Signed)
Pharmacy Antibiotic Note  Cynthia Marks is a 84 y.o. female admitted on 08/02/2017 with UTI.  Pharmacy has been consulted for Levaquin dosing.  Plan: Levaquin 750mg  x 1 then 500mg  IV q24hrs Monitor labs, progress, c/s  Height: 5\' 8"  (172.7 cm) Weight: 200 lb (90.7 kg) IBW/kg (Calculated) : 63.9  Temp (24hrs), Avg:97.7 F (36.5 C), Min:97.7 F (36.5 C), Max:97.7 F (36.5 C)  Recent Labs  Lab 08/02/17 1159  WBC 14.3*  CREATININE 1.12*    Estimated Creatinine Clearance: 45.6 mL/min (A) (by C-G formula based on SCr of 1.12 mg/dL (H)).    Allergies  Allergen Reactions  . Aspirin   . Ibuprofen     Nausea or HTN  . Penicillins Rash    Has patient had a PCN reaction causing immediate rash, facial/tongue/throat swelling, SOB or lightheadedness with hypotension: unknown Has patient had a PCN reaction causing severe rash involving mucus membranes or skin necrosis: unknown Has patient had a PCN reaction that required hospitalization: no Has patient had a PCN reaction occurring within the last 10 years: No If all of the above answers are "NO", then may proceed with Cephalosporin use.    Antimicrobials this admission: Levaquin 5/18 >>   Microbiology results:  BCx: pending  UCx: pending   Thank you for allowing pharmacy to be a part of this patient's care.  Hart Robinsons A 08/02/2017 1:55 PM

## 2017-08-02 NOTE — ED Notes (Signed)
Patient transported to CT 

## 2017-08-02 NOTE — ED Notes (Signed)
ED Provider at bedside. 

## 2017-08-03 ENCOUNTER — Encounter (HOSPITAL_COMMUNITY): Payer: Self-pay | Admitting: *Deleted

## 2017-08-03 DIAGNOSIS — G939 Disorder of brain, unspecified: Secondary | ICD-10-CM

## 2017-08-03 LAB — CBC WITH DIFFERENTIAL/PLATELET
BASOS PCT: 0 %
Basophils Absolute: 0 10*3/uL (ref 0.0–0.1)
EOS PCT: 1 %
Eosinophils Absolute: 0.1 10*3/uL (ref 0.0–0.7)
HCT: 35.9 % — ABNORMAL LOW (ref 36.0–46.0)
Hemoglobin: 11.3 g/dL — ABNORMAL LOW (ref 12.0–15.0)
LYMPHS PCT: 16 %
Lymphs Abs: 1.5 10*3/uL (ref 0.7–4.0)
MCH: 30.7 pg (ref 26.0–34.0)
MCHC: 31.5 g/dL (ref 30.0–36.0)
MCV: 97.6 fL (ref 78.0–100.0)
Monocytes Absolute: 0.6 10*3/uL (ref 0.1–1.0)
Monocytes Relative: 6 %
NEUTROS ABS: 7.3 10*3/uL (ref 1.7–7.7)
Neutrophils Relative %: 77 %
PLATELETS: 267 10*3/uL (ref 150–400)
RBC: 3.68 MIL/uL — ABNORMAL LOW (ref 3.87–5.11)
RDW: 13.8 % (ref 11.5–15.5)
WBC: 9.6 10*3/uL (ref 4.0–10.5)

## 2017-08-03 LAB — COMPREHENSIVE METABOLIC PANEL
ALT: 9 U/L — ABNORMAL LOW (ref 14–54)
ANION GAP: 10 (ref 5–15)
AST: 18 U/L (ref 15–41)
Albumin: 3.4 g/dL — ABNORMAL LOW (ref 3.5–5.0)
Alkaline Phosphatase: 63 U/L (ref 38–126)
BILIRUBIN TOTAL: 1.1 mg/dL (ref 0.3–1.2)
BUN: 25 mg/dL — ABNORMAL HIGH (ref 6–20)
CO2: 24 mmol/L (ref 22–32)
Calcium: 10.2 mg/dL (ref 8.9–10.3)
Chloride: 106 mmol/L (ref 101–111)
Creatinine, Ser: 0.97 mg/dL (ref 0.44–1.00)
GFR calc Af Amer: >60 mL/min (ref >60)
GFR calc non Af Amer: 53 mL/min — ABNORMAL LOW (ref >60)
Glucose, Bld: 90 mg/dL (ref 65–99)
POTASSIUM: 4.4 mmol/L (ref 3.5–5.1)
Sodium: 140 mmol/L (ref 135–145)
TOTAL PROTEIN: 7.4 g/dL (ref 6.5–8.1)

## 2017-08-03 LAB — MAGNESIUM: MAGNESIUM: 1.9 mg/dL (ref 1.7–2.4)

## 2017-08-03 MED ORDER — SENNOSIDES-DOCUSATE SODIUM 8.6-50 MG PO TABS
1.0000 | ORAL_TABLET | Freq: Two times a day (BID) | ORAL | Status: DC
  Administered 2017-08-03 – 2017-08-05 (×5): 1 via ORAL
  Filled 2017-08-03 (×5): qty 1

## 2017-08-03 MED ORDER — ENOXAPARIN SODIUM 40 MG/0.4ML ~~LOC~~ SOLN
40.0000 mg | SUBCUTANEOUS | Status: DC
  Administered 2017-08-03 – 2017-08-04 (×2): 40 mg via SUBCUTANEOUS
  Filled 2017-08-03 (×2): qty 0.4

## 2017-08-03 NOTE — Plan of Care (Signed)
  Problem: Acute Rehab PT Goals(only PT should resolve) Goal: Pt Will Go Supine/Side To Sit Outcome: Progressing Flowsheets (Taken 08/03/2017 1215) Pt will go Supine/Side to Sit: with minimal assist Goal: Patient Will Transfer Sit To/From Stand Outcome: Progressing Flowsheets (Taken 08/03/2017 1215) Patient will transfer sit to/from stand: with moderate assist Goal: Pt Will Transfer Bed To Chair/Chair To Bed Outcome: Progressing Flowsheets (Taken 08/03/2017 1215) Pt will Transfer Bed to Chair/Chair to Bed: with mod assist Goal: Pt Will Ambulate Outcome: Progressing Flowsheets (Taken 08/03/2017 1215) Pt will Ambulate: 10 feet;with moderate assist;with rolling walker  12:16 PM, 08/03/17 Lonell Grandchild, MPT Physical Therapist with [facility] [phone] office [phone] mobile phone

## 2017-08-03 NOTE — Evaluation (Signed)
Physical Therapy Evaluation Patient Details Name: ZELLA DEWAN MRN: [id number] DOB: 1932/06/07 Today's Date: 08/03/2017   History of Present Illness  Cynthia Marks is a 84 y.o. female who has been becoming increasingly debilitated and weak over the past several months presented to the emergency department after an another fall at home.  She had been seen last week in the ED for another fall.  The patient did not have any acute fractures at that time and was sent home.  She has been following up outpatient and they have been making arrangements for her to have assistance at home.  The patient continues to report that she loses her balance.  With the current episode she reports that she lost her balance on her walker and fell at home she does not know if she hit her head or not.  She complained of initial pain in the left side of the head, bilateral ankles, right hip and left hip.  She also has been concerned about some spontaneous twitching episodes on the left side of her body that she does not seem to be able to control.  She denies shortness of breath and chest pain.  She lives with her husband at home but he is frail and unable to assist her.    Clinical Impression  Patient severe fall risk due to generalized weakness mostly left side of body with difficulty using LUE to grip RW, poor standing balance with leaning to the left, unsafe to use RW for transfers and required stand pivot for bed<>w/c transfers.  Patient tolerated sitting up for 1 hour before put back to bed.  Patient will benefit from continued physical therapy in hospital and recommended venue below to increase strength, balance, endurance for safe ADLs and gait.    Follow Up Recommendations SNF    Equipment Recommendations  Rolling walker with 5" wheels(patient request replacement walker due to present walker old > 5 years, may benefit from Up Walker due to kyphotic trunk)    Recommendations for Other Services        Precautions / Restrictions Precautions Precautions: Fall Restrictions Weight Bearing Restrictions: No      Mobility  Bed Mobility Overal bed mobility: Needs Assistance Bed Mobility: Supine to Sit;Sit to Supine     Supine to sit: Mod assist Sit to supine: Mod assist   General bed mobility comments: slow labored movement  Transfers Overall transfer level: Needs assistance Equipment used: Rolling walker (2 wheeled);1 person hand held assist Transfers: Sit to/from [employer] Transfers Sit to Stand: Max assist Stand pivot transfers: Max assist       General transfer comment: partially stood with RW, but unsafe to use due to poor grip with LUE, required stand pivot for transfers  Ambulation/Gait                Stairs            Wheelchair Mobility    Modified Rankin (Stroke Patients Only)       Balance Overall balance assessment: Needs assistance Sitting-balance support: Feet supported;No upper extremity supported Sitting balance-Leahy Scale: Fair     Standing balance support: Bilateral upper extremity supported;During functional activity Standing balance-Leahy Scale: Poor Standing balance comment: poor without AD or attempting to use RW                             Pertinent Vitals/Pain Pain Assessment: No/denies pain    Home Living  Family/patient expects to be discharged to:: Private residence Living Arrangements: Spouse/significant other Available Help at Discharge: Family;Personal care attendant;Available PRN/intermittently Type of Home: House Home Access: Ramped entrance     Home Layout: One level Home Equipment: Walker - 2 wheels;Shower seat;Bedside commode;Grab bars - tub/shower;Cane - quad      Prior Function Level of Independence: Needs assistance   Gait / Transfers Assistance Needed: household gait using RW  ADL's / Homemaking Assistance Needed: assisted by home aides PRN        Hand Dominance   Dominant  Hand: Right    Extremity/Trunk Assessment   Upper Extremity Assessment Upper Extremity Assessment: Defer to OT evaluation    Lower Extremity Assessment Lower Extremity Assessment: Generalized weakness;RLE deficits/detail;LLE deficits/detail RLE Deficits / Details: grossly 3/5 LLE Deficits / Details: grossly 2+/5, except left ankle dorsiflexion -2/5 possibly due to plantar flexor contractures    Cervical / Trunk Assessment Cervical / Trunk Assessment: Kyphotic  Communication   Communication: No difficulties  Cognition Arousal/Alertness: Awake/alert Behavior During Therapy: WFL for tasks assessed/performed Overall Cognitive Status: Within Functional Limits for tasks assessed                                        General Comments      Exercises     Assessment/Plan    PT Assessment Patient needs continued PT services  PT Problem List Decreased strength;Decreased activity tolerance;Decreased balance;Decreased mobility       PT Treatment Interventions Gait training;Functional mobility training;Therapeutic activities;Therapeutic exercise;Patient/family education    PT Goals (Current goals can be found in the Care Plan section)  Acute Rehab PT Goals Patient Stated Goal: return home after rehab PT Goal Formulation: With patient/family Time For Goal Achievement: 08/24/17 Potential to Achieve Goals: Good    Frequency Min 3X/week   Barriers to discharge        Co-evaluation               AM-PAC PT "6 Clicks" Daily Activity  Outcome Measure Difficulty turning over in bed (including adjusting bedclothes, sheets and blankets)?: A Lot Difficulty moving from lying on back to sitting on the side of the bed? : A Lot Difficulty sitting down on and standing up from a chair with arms (e.g., wheelchair, bedside commode, etc,.)?: A Lot Help needed moving to and from a bed to chair (including a wheelchair)?: A Lot Help needed walking in hospital room?:  Total Help needed climbing 3-5 steps with a railing? : Total 6 Click Score: 10    End of Session Equipment Utilized During Treatment: Gait belt Activity Tolerance: Patient tolerated treatment well;Patient limited by fatigue Patient left: in chair;with call bell/phone within reach;with chair alarm set Nurse Communication: Mobility status;Other (comment)(RN notified patient left up in chair) PT Visit Diagnosis: Unsteadiness on feet (R26.81);Other abnormalities of gait and mobility (R26.89);Muscle weakness (generalized) (M62.81)    Time: 3016-0109 PT Time Calculation (min) (ACUTE ONLY): 26 min   Charges:   PT Evaluation $PT Eval Moderate Complexity: 1 Mod PT Treatments $Therapeutic Activity: 23-37 mins   PT G Codes:        12:12 PM, 08/28/17 Lonell Grandchild, MPT Physical Therapist with [facility] [phone] office [phone] mobile phone

## 2017-08-03 NOTE — Progress Notes (Signed)
PROGRESS NOTE    Cynthia Marks  IWL:[id number]  DOB: 05/10/1932  DOA: 08/02/2017 PCP: Kathyrn Drown, MD  Brief Admission Hx: Cynthia Marks is a 84 y.o. female who has been becoming increasingly debilitated and weak over the past several months presented to the emergency department after an another fall at home.  She had been seen last week in the ED for another fall. Pt was found to have a right parietal brain lesion and was having partial seizures involving left side of body.   MDM/Assessme nt & Plan:    1. New lesion of the right parietal brain-concerning for malignancy.  Given the convulsions involving the left side of her body I am concerned that she is having partial seizures.  Obtain MRI of brain for further evaluation.  Likely will need an oncology follow-up.  Also further testing with CT of chest abdomen and pelvis check for primary cancer source. 2. Acute renal failure-resolved with IVF hydration.  3. UTI-follow urine culture, treating with levofloxacin. 4. Goiter / Hypothyroidism-has been stable and was recently seen by endocrinology, continue levothyroxine 150 mcg daily. 5. Partial seizures/Convulsions-involving the left side of the body concerning that this is a result of the parietal lesion.  I am going to treat her with Keppra to prevent these episodes.  She has not had a recurrence since bin on the keppra.  6. Hyperparathyroidism-patient is being followed by Dr. Dorris Fetch for this. 7. Hypercalcemia of malignancy- renally dosed Zometa has been given.  Continue IV fluid hydration.  Recheck BMP in the morning. 8. Diabetes mellitus, type 2-has been well controlled with diet.  She has been taken off her Januvia by endocrinology as her A1c has been 5.3%. 9. Frequent falls- likely this is multifactorial given the above findings, PT evaluation has been requested and I do suspect that patient likely is going to need rehab placement. 10. Essential hypertension-resume home blood  pressure medications and follow closely. 11. Leukocytosis-Resolved.  Likely secondary to UTI and possibly reactive from convulsions.  DVT Prophylaxis: Lovenox Code Status: Full Family Communication: husband /niece,nephew at bedside Disposition Plan: Continue inpatient care and work-up, treatment, suspect will need placement or skilled nursing care  Consultants:  oncology   Subjective: Pt says she didn't sleep well but she has been feeling better this morning.   Objective: Vitals:   08/02/17 1516 08/02/17 2027 08/02/17 2114 08/03/17 0633  BP: (!) 167/95 126/72  140/62  Pulse: 86 75  79  Resp: 15 17  19   Temp: 97.9 F (36.6 C) 97.6 F (36.4 C)  97.7 F (36.5 C)  TempSrc: Oral Oral  Oral  SpO2: 100% 99% 95% 100%  Weight:      Height:        Intake/Output Summary (Last 24 hours) at 08/03/2017 1059 Last data filed at 08/03/2017 1740 Gross per 24 hour  Intake 1589.13 ml  Output 1500 ml  Net 89.13 ml   Filed Weights   08/02/17 0649  Weight: 90.7 kg (200 lb)    REVIEW OF SYSTEMS  As per history otherwise all reviewed and reported negative  Exam:  General exam: awake, alert, NAD. Cooperative.  Respiratory system: Clear. No increased work of breathing. Cardiovascular system: S1 & S2 heard, RRR. No JVD.  Gastrointestinal system: Abdomen is nondistended, soft and nontender. Normal bowel sounds heard. Central nervous system: Alert and oriented. No focal neurological deficits. Extremities: no CCE.  Data Reviewed: Basic Metabolic Panel: Recent Labs  Lab 08/02/17 1159 08/03/17 0649  NA  138 140  K 4.3 4.4  CL 105 106  CO2 24 24  GLUCOSE 90 90  BUN 31* 25*  CREATININE 1.12* 0.97  CALCIUM 10.5* 10.2  MG  --  1.9   Liver Function Tests: Recent Labs  Lab 08/03/17 0649  AST 18  ALT 9*  ALKPHOS 63  BILITOT 1.1  PROT 7.4  ALBUMIN 3.4*   No results for input(s): LIPASE, AMYLASE in the last 168 hours. No results for input(s): AMMONIA in the last 168  hours. CBC: Recent Labs  Lab 08/02/17 1159 08/03/17 0649  WBC 14.3* 9.6  NEUTROABS 10.6* 7.3  HGB 11.6* 11.3*  HCT 35.7* 35.9*  MCV 96.7 97.6  PLT 287 267   Cardiac Enzymes: No results for input(s): CKTOTAL, CKMB, CKMBINDEX, TROPONINI in the last 168 hours. CBG (last 3)  No results for input(s): GLUCAP in the last 72 hours. No results found for this or any previous visit (from the past 240 hour(s)).   Studies: Dg Ankle Complete Left  Result Date: 08/02/2017 CLINICAL DATA:  Fall in bathroom with left ankle pain. EXAM: LEFT ANKLE COMPLETE - 3+ VIEW COMPARISON:  None. FINDINGS: Mild soft tissue swelling over the ankle. Ankle mortise is within normal. No evidence of acute fracture or dislocation. IMPRESSION: No acute fracture. Electronically Signed   By: Marin Olp M.D.   On: 08/02/2017 08:47   Dg Ankle Complete Right  Result Date: 08/02/2017 CLINICAL DATA:  Fall in bathroom with right ankle pain. EXAM: RIGHT ANKLE - COMPLETE 3+ VIEW COMPARISON:  07/22/2017 FINDINGS: Examination demonstrates minimal soft tissue swelling over the ankle. No evidence of acute fracture or dislocation. Old healed distal fibular and medial malleolar fractures. Slight stable widening of the medial aspect of the ankle mortise. Small inferior calcaneal spur. IMPRESSION: No acute fracture. Old bimalleolar fractures and stable widening of the medial aspect of the ankle mortise. Electronically Signed   By: Marin Olp M.D.   On: 08/02/2017 08:46   Ct Head Wo Contrast  Result Date: 08/02/2017 CLINICAL DATA:  Fall.  Hit left side of head. EXAM: CT HEAD WITHOUT CONTRAST CT CERVICAL SPINE WITHOUT CONTRAST TECHNIQUE: Multidetector CT imaging of the head and cervical spine was performed following the standard protocol without intravenous contrast. Multiplanar CT image reconstructions of the cervical spine were also generated. COMPARISON:  CT head dated December 04, 2011. FINDINGS: CT HEAD FINDINGS Brain: There is new  hypodensity within the right parietal lobe surrounding a questionable mass lesion (series 4, image 52; series 5, image 25). There is mild effacement of the adjacent sulci. No evidence of acute infarction, hemorrhage, hydrocephalus, or extra-axial collection. Stable mild cerebral atrophy and chronic microvascular ischemic changes. Vascular: Atherosclerotic vascular calcification of the carotid siphons. No hyperdense vessel. Skull: Negative for fracture or focal lesion. Sinuses/Orbits: No acute finding. Other: None. CT CERVICAL SPINE FINDINGS Alignment: Normal. Skull base and vertebrae: No acute fracture. No primary bone lesion or focal pathologic process. Soft tissues and spinal canal: No prevertebral fluid or swelling. No visible canal hematoma. Disc levels: Mild disc height loss from C4-C5 through C6-C7. Moderate uncovertebral hypertrophy at C5-C6 and C6-C7. Severe facet arthropathy throughout the cervical spine with fusion of the right C4-C5 and left C3-C4 facet joints. Upper chest: Negative. Other: None. IMPRESSION: 1. New questionable mass lesion in the right parietal lobe with surrounding vasogenic edema. Recommend contrast-enhanced MRI of the brain for further evaluation. 2. No evidence of traumatic injury within the head or cervical spine. Electronically Signed  By: Titus Dubin M.D.   On: 08/02/2017 08:29   Ct Chest W Contrast  Result Date: 08/02/2017 CLINICAL DATA:  Initial workup due to presumed CNS metastasis. EXAM: CT CHEST, ABDOMEN, AND PELVIS WITH CONTRAST TECHNIQUE: Multidetector CT imaging of the chest, abdomen and pelvis was performed following the standard protocol during bolus administration of intravenous contrast. CONTRAST:  75 cc ISOVUE-300 IOPAMIDOL (ISOVUE-300) INJECTION 61%, 75mL ISOVUE-300 IOPAMIDOL (ISOVUE-300) INJECTION 61% COMPARISON:  08/02/2017 head CT, chest radiograph 05/28/2017 FINDINGS: CT CHEST FINDINGS Cardiovascular: Retroesophageal course of the internal carotid  arteries bilaterally. Conventional branch pattern of the great vessels without significant stenosis. Ectatic ascending thoracic aorta to 3.7 cm. No thoracic aortic dissection. Minimal coronary arteriosclerosis along left main and proximal LAD. No acute pulmonary embolus to the segmental level. Heart size is borderline enlarged without pericardial effusion. Mediastinum/Nodes: Thyroid goiter with coarse calcifications within the left lobe. Subcentimeter cystic nodules are identified the lower poles of both lobes. Patent midline trachea and mainstem bronchi. No mediastinal hilar lymphadenopathy. Lungs/Pleura: No dominant mass or pulmonary consolidation. Multilobar bilateral subpleural areas of atelectasis and/or scarring. Mild peribronchial thickening within both lower lobes with subpleural areas of interstitial prominence and/or fibrosis in the lower lobes bilaterally. Small paraseptal foci of emphysema along the periphery of the right middle and both lower lobes. No effusion or pneumothorax. Musculoskeletal: Subchondral cystic change of both humeral heads and glenoid fossae. No aggressive osteolytic or blastic disease. Degenerative disc disease T7 through T10. CT ABDOMEN PELVIS FINDINGS Hepatobiliary: The liver enhances homogeneously. The gallbladder is physiologically distended without calculus. No biliary dilatation noted. Pancreas: No enhancing pancreatic mass or ductal dilatation. No inflammation. There appears to be mild side branch ductal ectasia of the pancreatic tail. Tiny side-branch intraductal papillary mucinous neoplasms are not entirely excluded, series 2/54. Spleen: Normal size.  No enhancing mass. Adrenals/Urinary Tract: Normal bilateral adrenal glands. 2.2 cm upper pole right renal cyst, simple in appearance with smaller subcentimeter hypodense lesions of both kidneys statistically consistent with cysts in the interpolar aspect of both kidneys and right lower pole. No nephrolithiasis, enhancing renal  mass nor obstructive uropathy. The urinary bladder demonstrates no calculus or focal mural thickening. No focal mass. Stomach/Bowel: Small hiatal hernia. Decompressed stomach. Normal small bowel rotation is noted. No small-bowel dilatation or mass. No inflammation. The distal terminal ileum are unremarkable. No findings of acute appendicitis. Scattered colonic diverticulosis without acute diverticulitis. No annular constricting lesions. Moderate fecal retention within the descending and sigmoid colon. Vascular/Lymphatic: Mild-to-moderate aortic atherosclerosis. No aneurysm or adenopathy. Reproductive: Hysterectomy.  No adnexal mass. Other: No free air nor free fluid. Musculoskeletal: Degenerative disc disease L4-5. No aggressive osteolytic or blastic disease. Multilevel degenerative facet arthropathy of the lumbar spine. Sclerosis about the pubic symphysis and both SI joints. Lumbosacral transitional vertebral anatomy with pseudoarticulation of L5 with S1 on the left. IMPRESSION: Chest CT: 1. Multinodular goiter. 2. Coronary arteriosclerosis and aortic atherosclerosis. No aneurysm or acute pulmonary embolus. 3. No pulmonary lesions. 4. Minimal subpleural areas of bilateral atelectasis and/or fibrosis. CT AP: 1. Tiny cystic foci in the pancreatic tail may represent ectatic pancreatic duct side branches. Side branch IPMN might also be a possibility. 2. Bilateral renal cysts, the largest measuring 2.2 cm in the upper pole. The remainder are too small to further characterize in the interpolar aspect of both kidneys and right lower pole. 3. Scattered colonic diverticulosis without acute diverticulitis. Masslike abnormality identified. Electronically Signed   By: Ashley Royalty M.D.   On: 08/02/2017 19:16  Ct Cervical Spine Wo Contrast  Result Date: 08/02/2017 CLINICAL DATA:  Fall.  Hit left side of head. EXAM: CT HEAD WITHOUT CONTRAST CT CERVICAL SPINE WITHOUT CONTRAST TECHNIQUE: Multidetector CT imaging of the head  and cervical spine was performed following the standard protocol without intravenous contrast. Multiplanar CT image reconstructions of the cervical spine were also generated. COMPARISON:  CT head dated December 04, 2011. FINDINGS: CT HEAD FINDINGS Brain: There is new hypodensity within the right parietal lobe surrounding a questionable mass lesion (series 4, image 52; series 5, image 25). There is mild effacement of the adjacent sulci. No evidence of acute infarction, hemorrhage, hydrocephalus, or extra-axial collection. Stable mild cerebral atrophy and chronic microvascular ischemic changes. Vascular: Atherosclerotic vascular calcification of the carotid siphons. No hyperdense vessel. Skull: Negative for fracture or focal lesion. Sinuses/Orbits: No acute finding. Other: None. CT CERVICAL SPINE FINDINGS Alignment: Normal. Skull base and vertebrae: No acute fracture. No primary bone lesion or focal pathologic process. Soft tissues and spinal canal: No prevertebral fluid or swelling. No visible canal hematoma. Disc levels: Mild disc height loss from C4-C5 through C6-C7. Moderate uncovertebral hypertrophy at C5-C6 and C6-C7. Severe facet arthropathy throughout the cervical spine with fusion of the right C4-C5 and left C3-C4 facet joints. Upper chest: Negative. Other: None. IMPRESSION: 1. New questionable mass lesion in the right parietal lobe with surrounding vasogenic edema. Recommend contrast-enhanced MRI of the brain for further evaluation. 2. No evidence of traumatic injury within the head or cervical spine. Electronically Signed   By: Titus Dubin M.D.   On: 08/02/2017 08:29   Ct Abdomen Pelvis W Contrast  Result Date: 08/02/2017 CLINICAL DATA:  Initial workup due to presumed CNS metastasis. EXAM: CT CHEST, ABDOMEN, AND PELVIS WITH CONTRAST TECHNIQUE: Multidetector CT imaging of the chest, abdomen and pelvis was performed following the standard protocol during bolus administration of intravenous contrast.  CONTRAST:  75 cc ISOVUE-300 IOPAMIDOL (ISOVUE-300) INJECTION 61%, 10mL ISOVUE-300 IOPAMIDOL (ISOVUE-300) INJECTION 61% COMPARISON:  08/02/2017 head CT, chest radiograph 05/28/2017 FINDINGS: CT CHEST FINDINGS Cardiovascular: Retroesophageal course of the internal carotid arteries bilaterally. Conventional branch pattern of the great vessels without significant stenosis. Ectatic ascending thoracic aorta to 3.7 cm. No thoracic aortic dissection. Minimal coronary arteriosclerosis along left main and proximal LAD. No acute pulmonary embolus to the segmental level. Heart size is borderline enlarged without pericardial effusion. Mediastinum/Nodes: Thyroid goiter with coarse calcifications within the left lobe. Subcentimeter cystic nodules are identified the lower poles of both lobes. Patent midline trachea and mainstem bronchi. No mediastinal hilar lymphadenopathy. Lungs/Pleura: No dominant mass or pulmonary consolidation. Multilobar bilateral subpleural areas of atelectasis and/or scarring. Mild peribronchial thickening within both lower lobes with subpleural areas of interstitial prominence and/or fibrosis in the lower lobes bilaterally. Small paraseptal foci of emphysema along the periphery of the right middle and both lower lobes. No effusion or pneumothorax. Musculoskeletal: Subchondral cystic change of both humeral heads and glenoid fossae. No aggressive osteolytic or blastic disease. Degenerative disc disease T7 through T10. CT ABDOMEN PELVIS FINDINGS Hepatobiliary: The liver enhances homogeneously. The gallbladder is physiologically distended without calculus. No biliary dilatation noted. Pancreas: No enhancing pancreatic mass or ductal dilatation. No inflammation. There appears to be mild side branch ductal ectasia of the pancreatic tail. Tiny side-branch intraductal papillary mucinous neoplasms are not entirely excluded, series 2/54. Spleen: Normal size.  No enhancing mass. Adrenals/Urinary Tract: Normal  bilateral adrenal glands. 2.2 cm upper pole right renal cyst, simple in appearance with smaller subcentimeter hypodense lesions of both  kidneys statistically consistent with cysts in the interpolar aspect of both kidneys and right lower pole. No nephrolithiasis, enhancing renal mass nor obstructive uropathy. The urinary bladder demonstrates no calculus or focal mural thickening. No focal mass. Stomach/Bowel: Small hiatal hernia. Decompressed stomach. Normal small bowel rotation is noted. No small-bowel dilatation or mass. No inflammation. The distal terminal ileum are unremarkable. No findings of acute appendicitis. Scattered colonic diverticulosis without acute diverticulitis. No annular constricting lesions. Moderate fecal retention within the descending and sigmoid colon. Vascular/Lymphatic: Mild-to-moderate aortic atherosclerosis. No aneurysm or adenopathy. Reproductive: Hysterectomy.  No adnexal mass. Other: No free air nor free fluid. Musculoskeletal: Degenerative disc disease L4-5. No aggressive osteolytic or blastic disease. Multilevel degenerative facet arthropathy of the lumbar spine. Sclerosis about the pubic symphysis and both SI joints. Lumbosacral transitional vertebral anatomy with pseudoarticulation of L5 with S1 on the left. IMPRESSION: Chest CT: 1. Multinodular goiter. 2. Coronary arteriosclerosis and aortic atherosclerosis. No aneurysm or acute pulmonary embolus. 3. No pulmonary lesions. 4. Minimal subpleural areas of bilateral atelectasis and/or fibrosis. CT AP: 1. Tiny cystic foci in the pancreatic tail may represent ectatic pancreatic duct side branches. Side branch IPMN might also be a possibility. 2. Bilateral renal cysts, the largest measuring 2.2 cm in the upper pole. The remainder are too small to further characterize in the interpolar aspect of both kidneys and right lower pole. 3. Scattered colonic diverticulosis without acute diverticulitis. Masslike abnormality identified.  Electronically Signed   By: Ashley Royalty M.D.   On: 08/02/2017 19:16   Dg Knee Complete 4 Views Right  Result Date: 08/02/2017 CLINICAL DATA:  Fall in bathroom with right knee pain. EXAM: RIGHT KNEE - COMPLETE 4+ VIEW COMPARISON:  None. FINDINGS: Mild diffuse osteopenia. Mild tricompartmental osteoarthritic change worse over the lateral compartment. No evidence of acute fracture or dislocation. Small joint effusion is present. IMPRESSION: No acute fracture. Small joint effusion. Mild tricompartmental osteoarthritis. Electronically Signed   By: Marin Olp M.D.   On: 08/02/2017 08:45   Dg Hip Unilat W Or Wo Pelvis 2-3 Views Left  Result Date: 08/02/2017 CLINICAL DATA:  Fall in bathroom with left-sided pain. EXAM: DG HIP (WITH OR WITHOUT PELVIS) 2-3V LEFT COMPARISON:  07/22/2017 FINDINGS: Mild diffuse osteopenia. Mild symmetric degenerative change of the hips. No evidence of acute fracture or dislocation. There are degenerative changes of the spine and sacroiliac joints. IMPRESSION: No acute fracture. Electronically Signed   By: Marin Olp M.D.   On: 08/02/2017 08:49   Scheduled Meds: . amLODipine  2.5 mg Oral Daily  . carvedilol  6.25 mg Oral BID WC  . enoxaparin (LOVENOX) injection  40 mg Subcutaneous Q24H  . levothyroxine  150 mcg Oral QAC breakfast   Continuous Infusions: . sodium chloride 60 mL/hr at 08/03/17 0901  . levETIRAcetam Stopped (08/03/17 0516)  . levofloxacin (LEVAQUIN) IV      Principal Problem:   Hypercalcemia of malignancy Active Problems:   Lesion of parietal lobe of brain   Gait abnormality   Dehydration   Hyponatremia   ARF (acute renal failure) (HCC)   Uncontrolled type 2 diabetes mellitus with complication, without long-term current use of insulin (HCC)   HTN (hypertension)   Hypothyroidism   Osteoporosis   Hyperparathyroidism ([place])   UTI (urinary tract infection)   At maximum risk for fall   Convulsions ([place])  Time spent:   Irwin Brakeman, MD,  FAAFP [facility] Pager [phone]  If 7PM-7AM, please contact night-coverage [email or web address] Password [email or web address] 08/03/2017, 10:59 AM  LOS: 1 day

## 2017-08-04 ENCOUNTER — Inpatient Hospital Stay (HOSPITAL_COMMUNITY): Payer: Medicare Other

## 2017-08-04 DIAGNOSIS — C719 Malignant neoplasm of brain, unspecified: Secondary | ICD-10-CM

## 2017-08-04 DIAGNOSIS — R259 Unspecified abnormal involuntary movements: Secondary | ICD-10-CM

## 2017-08-04 DIAGNOSIS — W010XXS Fall on same level from slipping, tripping and stumbling without subsequent striking against object, sequela: Secondary | ICD-10-CM

## 2017-08-04 LAB — COMPREHENSIVE METABOLIC PANEL
ALT: 9 U/L — ABNORMAL LOW (ref 14–54)
ANION GAP: 8 (ref 5–15)
AST: 16 U/L (ref 15–41)
Albumin: 3.1 g/dL — ABNORMAL LOW (ref 3.5–5.0)
Alkaline Phosphatase: 57 U/L (ref 38–126)
BILIRUBIN TOTAL: 0.5 mg/dL (ref 0.3–1.2)
BUN: 20 mg/dL (ref 6–20)
CO2: 24 mmol/L (ref 22–32)
Calcium: 9.6 mg/dL (ref 8.9–10.3)
Chloride: 108 mmol/L (ref 101–111)
Creatinine, Ser: 0.91 mg/dL (ref 0.44–1.00)
GFR, EST NON AFRICAN AMERICAN: 57 mL/min — AB (ref >60)
Glucose, Bld: 97 mg/dL (ref 65–99)
POTASSIUM: 4.5 mmol/L (ref 3.5–5.1)
Sodium: 140 mmol/L (ref 135–145)
TOTAL PROTEIN: 6.8 g/dL (ref 6.5–8.1)

## 2017-08-04 LAB — MAGNESIUM: Magnesium: 1.9 mg/dL (ref 1.7–2.4)

## 2017-08-04 LAB — URINE CULTURE: Culture: >=100000 — AB

## 2017-08-04 MED ORDER — LORAZEPAM 2 MG/ML IJ SOLN
0.5000 mg | Freq: Four times a day (QID) | INTRAMUSCULAR | Status: DC | PRN
  Administered 2017-08-04 – 2017-08-05 (×2): 0.5 mg via INTRAVENOUS
  Filled 2017-08-04 (×2): qty 1

## 2017-08-04 MED ORDER — DEXAMETHASONE SODIUM PHOSPHATE 4 MG/ML IJ SOLN
4.0000 mg | Freq: Four times a day (QID) | INTRAMUSCULAR | Status: DC
  Administered 2017-08-04 – 2017-08-05 (×4): 4 mg via INTRAVENOUS
  Filled 2017-08-04 (×5): qty 1

## 2017-08-04 MED ORDER — GADOBENATE DIMEGLUMINE 529 MG/ML IV SOLN
20.0000 mL | Freq: Once | INTRAVENOUS | Status: AC | PRN
  Administered 2017-08-04: 20 mL via INTRAVENOUS

## 2017-08-04 NOTE — Progress Notes (Signed)
Physical Therapy Treatment Patient Details Name: Cynthia Marks MRN: [id number] DOB: 27-Jan-1933 Today's Date: 08/04/2017    History of Present Illness Cynthia Marks is a 84 y.o. female who has been becoming increasingly debilitated and weak over the past several months presented to the emergency department after an another fall at home.  She had been seen last week in the ED for another fall.  The patient did not have any acute fractures at that time and was sent home.  She has been following up outpatient and they have been making arrangements for her to have assistance at home.  The patient continues to report that she loses her balance.  With the current episode she reports that she lost her balance on her walker and fell at home she does not know if she hit her head or not.  She complained of initial pain in the left side of the head, bilateral ankles, right hip and left hip.  She also has been concerned about some spontaneous twitching episodes on the left side of her body that she does not seem to be able to control.  She denies shortness of breath and chest pain.  She lives with her husband at home but he is frail and unable to assist her.    PT Comments    Patient tolerated sitting up at bedside for approximately 25-30 minutes with focus of treatment on trunk control while completing BLE ROM exercises.  Patient tends to lean backwards and to the left when attempting dynamic movement of BLEs, tolerated standing with RW for up to 2-3 minutes with constant VC's to grip RW with left hand while pushing down secondary for tendency of left hand slipping off due to weakness, able to take a couple of shuffling side steps with tactile assistance to move LLE and unable to take steps forward due to poor standing balance/left sided weakness.  Patient demonstrates good return for using BLE for bridging and pushing self up in bed to reposition with bed in head down position.  Patient will benefit from  continued physical therapy in hospital and recommended venue below to increase strength, balance, endurance for safe ADLs and gait.   Follow Up Recommendations  SNF     Equipment Recommendations  Rolling walker with 5" wheels    Recommendations for Other Services       Precautions / Restrictions Precautions Precautions: Fall Restrictions Weight Bearing Restrictions: No    Mobility  Bed Mobility Overal bed mobility: Needs Assistance Bed Mobility: Supine to Sit;Sit to Supine     Supine to sit: Mod assist Sit to supine: Mod assist      Transfers Overall transfer level: Needs assistance Equipment used: Rolling walker (2 wheeled) Transfers: Sit to/from Stand Sit to Stand: Max assist            Ambulation/Gait Ambulation/Gait assistance: Max assist Ambulation Distance (Feet): 2 Feet Assistive device: Rolling walker (2 wheeled) Gait Pattern/deviations: Decreased step length - right;Decreased step length - left;Decreased stride length;Shuffle     General Gait Details: limited to 2 unsteady shuffling side steps with max tactile cueing to move LLE and constant verbal cueing to grip RW with left hand   Stairs             Wheelchair Mobility    Modified Rankin (Stroke Patients Only)       Balance Overall balance assessment: Needs assistance Sitting-balance support: Feet supported;No upper extremity supported Sitting balance-Leahy Scale: Fair Sitting balance - Comments: able  to keep trunk in midline for up to 4-5 minutes, leans backwards when attempting BLE exercises Postural control: Posterior lean;Left lateral lean Standing balance support: During functional activity;Bilateral upper extremity supported Standing balance-Leahy Scale: Poor Standing balance comment: using RW                            Cognition Arousal/Alertness: Awake/alert Behavior During Therapy: WFL for tasks assessed/performed Overall Cognitive Status: Within Functional  Limits for tasks assessed                                        Exercises General Exercises - Lower Extremity Long Arc Quad: Seated;AROM;Strengthening;10 reps Hip Flexion/Marching: Seated;AROM;Strengthening;10 reps Heel Raises: Seated;AROM;Strengthening;10 reps    General Comments        Pertinent Vitals/Pain Pain Assessment: No/denies pain    Home Living                      Prior Function            PT Goals (current goals can now be found in the care plan section) Acute Rehab PT Goals Patient Stated Goal: return home after rehab PT Goal Formulation: With patient/family Time For Goal Achievement: 08/24/17 Potential to Achieve Goals: Good Progress towards PT goals: Progressing toward goals    Frequency    Min 3X/week      PT Plan Current plan remains appropriate    Co-evaluation              AM-PAC PT "6 Clicks" Daily Activity  Outcome Measure  Difficulty turning over in bed (including adjusting bedclothes, sheets and blankets)?: A Lot Difficulty moving from lying on back to sitting on the side of the bed? : A Lot Difficulty sitting down on and standing up from a chair with arms (e.g., wheelchair, bedside commode, etc,.)?: A Lot Help needed moving to and from a bed to chair (including a wheelchair)?: A Lot Help needed walking in hospital room?: Total Help needed climbing 3-5 steps with a railing? : Total 6 Click Score: 10    End of Session   Activity Tolerance: Patient tolerated treatment well;Patient limited by fatigue Patient left: in bed;with call bell/phone within reach;with bed alarm set;with family/visitor present Nurse Communication: Mobility status PT Visit Diagnosis: Unsteadiness on feet (R26.81);Other abnormalities of gait and mobility (R26.89);Muscle weakness (generalized) (M62.81)     Time: 9798-9211 PT Time Calculation (min) (ACUTE ONLY): 35 min  Charges:  $Therapeutic Exercise: 8-22 mins $Therapeutic  Activity: 8-22 mins                    G Codes:       3:33 PM, 2017/08/25 Lonell Grandchild, MPT Physical Therapist with [facility] 336 [phone] office [phone] mobile phone

## 2017-08-04 NOTE — NC FL2 (Signed)
[place] LEVEL OF CARE SCREENING TOOL     IDENTIFICATION  Patient Name: Cynthia Marks Birthdate: [birth year] Sex: female Admission Date (Current Location): 08/02/2017  [facility] [place] Number:  [employer] and Address:  [place] [address] [place]      Provider Number: [id number]  Attending Physician Name and Address:  Murlean Iba, MD  Relative Name and Phone Number:       Current Level of Care: Hospital Recommended Level of Care: [place] Prior Approval Number:    Date Approved/Denied:   PASRR Number: [id number]  Discharge Plan: SNF    Current Diagnoses: Patient Active Problem List   Diagnosis Date Noted  . Hypercalcemia of malignancy 08/02/2017  . Lesion of parietal lobe of brain 08/02/2017  . UTI (urinary tract infection) 08/02/2017  . At maximum risk for fall 08/02/2017  . Convulsions ([place]) 08/02/2017  . Hyperparathyroidism ([place]) 11/25/2016  . Hypercalcemia 08/09/2016  . Osteoporosis 10/08/2013  . Hyperlipemia 11/30/2012  . Arthritis of knee, degenerative 09/23/2012  . Knee bursitis 09/23/2012  . Bacteremia 08/13/2012  . Acute pyelonephritis 08/11/2012  . Dehydration 08/11/2012  . Hyponatremia 08/11/2012  . ARF (acute renal failure) ([place]) 08/11/2012  . Uncontrolled type 2 diabetes mellitus with complication, without long-term current use of insulin ([place]) 08/11/2012  . HTN (hypertension) 08/11/2012  . Hypothyroidism 08/11/2012  . Gait abnormality 04/22/2012  . Ankle fracture, bimalleolar, closed 12/10/2011  . Skin breakdown 12/10/2011    Orientation RESPIRATION BLADDER Height & Weight     Self, Time, Situation, Place  Normal Incontinent Weight: 200 lb (90.7 kg) Height:  5\' 8"  (172.7 cm)  BEHAVIORAL SYMPTOMS/MOOD NEUROLOGICAL BOWEL NUTRITION STATUS      Continent Diet(Heart healthy. )  AMBULATORY STATUS COMMUNICATION OF NEEDS Skin   Extensive Assist Verbally  Normal                       Personal Care Assistance Level of Assistance  Bathing, Feeding, Dressing Bathing Assistance: Limited assistance Feeding assistance: Limited assistance Dressing Assistance: Limited assistance     Functional Limitations Info  Sight, Hearing, Speech Sight Info: Impaired(Patient has macular degeneration) Hearing Info: Adequate Speech Info: Adequate    SPECIAL CARE FACTORS FREQUENCY  PT (By licensed PT)     PT Frequency: 5x/week              Contractures Contractures Info: Not present    Additional Factors Info  Code Status, Allergies Code Status Info: Full Code Allergies Info: Aspirin, Ibuprofen, Penicillins           Current Medications (08/04/2017):  This is the current hospital active medication list Current Facility-Administered Medications  Medication Dose Route Frequency Provider Last Rate Last Dose  . acetaminophen (TYLENOL) tablet 650 mg  650 mg Oral Q6H PRN Johnson, Clanford L, MD   650 mg at 08/02/17 1409   Or  . acetaminophen (TYLENOL) suppository 650 mg  650 mg Rectal Q6H PRN Johnson, Clanford L, MD      . amLODipine (NORVASC) tablet 2.5 mg  2.5 mg Oral Daily Johnson, Clanford L, MD   2.5 mg at 08/04/17 2992  . carvedilol (COREG) tablet 6.25 mg  6.25 mg Oral BID WC Johnson, Clanford L, MD   6.25 mg at 08/04/17 0939  . dexamethasone (DECADRON) injection 4 mg  4 mg Intravenous Q6H Johnson, Clanford L, MD      . enoxaparin (LOVENOX) injection 40 mg  40 mg Subcutaneous Q24H Johnson, Clanford L, MD   40 mg at 08/03/17 1521  . hydrALAZINE (APRESOLINE) injection 10 mg  10 mg Intravenous Q4H PRN Johnson, Clanford L, MD      . HYDROcodone-acetaminophen (NORCO/VICODIN) 5-325 MG per tablet 1 tablet  1 tablet Oral Q6H PRN Wynetta Emery, Clanford L, MD   1 tablet at 08/02/17 1845  . levETIRAcetam (KEPPRA) IVPB 500 mg/100 mL premix  500 mg Intravenous Q12H Murlean Iba, MD   Stopped at 08/04/17 0422  . levofloxacin (LEVAQUIN) IVPB 500 mg   500 mg Intravenous Q24H Wynetta Emery, Clanford L, MD   Stopped at 08/04/17 1400  . levothyroxine (SYNTHROID, LEVOTHROID) tablet 150 mcg  150 mcg Oral QAC breakfast Wynetta Emery, Clanford L, MD   150 mcg at 08/04/17 0937  . LORazepam (ATIVAN) injection 0.5 mg  0.5 mg Intravenous Q6H PRN Opyd, Ilene Qua, MD   0.5 mg at 08/04/17 0153  . ondansetron (ZOFRAN) tablet 4 mg  4 mg Oral Q6H PRN Johnson, Clanford L, MD       Or  . ondansetron (ZOFRAN) injection 4 mg  4 mg Intravenous Q6H PRN Johnson, Clanford L, MD      . senna-docusate (Senokot-S) tablet 1 tablet  1 tablet Oral BID Murlean Iba, MD   1 tablet at 08/04/17 1610     Discharge Medications: Please see discharge summary for a list of discharge medications.  Relevant Imaging Results:  Relevant Lab Results:   Additional Information SSN [id number], Clydene Pugh, LCSW

## 2017-08-04 NOTE — Progress Notes (Signed)
PROGRESS NOTE    Cynthia Marks  XBD:[id number]  DOB: 1933/01/09  DOA: 08/02/2017 PCP: Kathyrn Drown, MD  Brief Admission Hx: Cynthia Marks is a 84 y.o. female who has been becoming increasingly debilitated and weak over the past several months presented to the emergency department after an another fall at home.  She had been seen last week in the ED for another fall. Pt was found to have a right parietal brain lesion and was having partial seizures involving left side of body.   MDM/Assessme nt & Plan:    1. New lesion of the right parietal brain-concerning for malignancy.  Given the convulsions involving the left side of her body I am concerned that she is having partial seizures.  Obtain MRI of brain for further evaluation.  Likely will need an oncology follow-up.  Also further testing with CT of chest abdomen and pelvis check for primary cancer source although it appears that the source may be pancreas.  I have asked for oncology team to consult for further recommendations. 2. Acute renal failure-resolved with IVF hydration.  3. Enterobacter UTI-final urine cultures pending, treating with levofloxacin. 4. Goiter / Hypothyroidism-has been stable and was recently seen by endocrinology, continue levothyroxine 150 mcg daily. 5. Partial seizures/Convulsions-involving the left side of the body concerning that this is a result of the parietal lesion.  I am going to treat her with Keppra to prevent these episodes.  She has not had a recurrence since being on the keppra.  6. Hyperparathyroidism-patient is being followed by Dr. Dorris Fetch for this. 7. Hypercalcemia of malignancy- renally dosed Zometa has been given.  Continue IV fluid hydration.  Recheck BMP in the morning. 8. Diabetes mellitus, type 2-has been well controlled with diet.  She has been taken off her Januvia by endocrinology as her A1c has been 5.3%. 9. Frequent falls- likely this is multifactorial given the above findings, PT  evaluation has been requested and I do suspect that patient likely is going to need rehab placement. 10. Essential hypertension-resume home blood pressure medications and follow closely. 11. Leukocytosis-Resolved.  Likely secondary to UTI and possibly reactive from convulsions.  DVT Prophylaxis: Lovenox Code Status: Full Family Communication: husband /niece,nephew at bedside Disposition Plan: Continue inpatient care and work-up, treatment, suspect will need placement or skilled nursing care  Consultants:  oncology  Subjective: Pt says she didn't sleep well but no further convulsions.   Objective: Vitals:   08/03/17 1400 08/03/17 2020 08/03/17 2118 08/04/17 0430  BP: (!) 144/90  (!) 122/99 (!) 151/68  Pulse: 78  76 73  Resp: 18  18 18   Temp: 98.1 F (36.7 C)  98.2 F (36.8 C) 97.8 F (36.6 C)  TempSrc: Oral  Oral Oral  SpO2: 100% 98% 98% 100%  Weight:      Height:        Intake/Output Summary (Last 24 hours) at 08/04/2017 8341 Last data filed at 08/04/2017 0600 Gross per 24 hour  Intake 720.33 ml  Output 1550 ml  Net -829.67 ml   Filed Weights   08/02/17 0649  Weight: 90.7 kg (200 lb)    REVIEW OF SYSTEMS  As per history otherwise all reviewed and reported negative  Exam:  General exam: awake, alert, NAD. Cooperative.  Respiratory system: Clear. No increased work of breathing. Cardiovascular system: S1 & S2 heard, RRR. No JVD.  Gastrointestinal system: Abdomen is nondistended, soft and nontender. Normal bowel sounds heard. Central nervous system: Alert and oriented. No focal neurological  deficits. Extremities: no CCE.  Data Reviewed: Basic Metabolic Panel: Recent Labs  Lab 08/02/17 1159 08/03/17 0649 08/04/17 0529  NA 138 140 140  K 4.3 4.4 4.5  CL 105 106 108  CO2 24 24 24   GLUCOSE 90 90 97  BUN 31* 25* 20  CREATININE 1.12* 0.97 0.91  CALCIUM 10.5* 10.2 9.6  MG  --  1.9 1.9   Liver Function Tests: Recent Labs  Lab 08/03/17 0649  08/04/17 0529  AST 18 16  ALT 9* 9*  ALKPHOS 63 57  BILITOT 1.1 0.5  PROT 7.4 6.8  ALBUMIN 3.4* 3.1*   No results for input(s): LIPASE, AMYLASE in the last 168 hours. No results for input(s): AMMONIA in the last 168 hours. CBC: Recent Labs  Lab 08/02/17 1159 08/03/17 0649  WBC 14.3* 9.6  NEUTROABS 10.6* 7.3  HGB 11.6* 11.3*  HCT 35.7* 35.9*  MCV 96.7 97.6  PLT 287 267   Cardiac Enzymes: No results for input(s): CKTOTAL, CKMB, CKMBINDEX, TROPONINI in the last 168 hours. CBG (last 3)  No results for input(s): GLUCAP in the last 72 hours. Recent Results (from the past 240 hour(s))  Urine culture     Status: Abnormal   Collection Time: 08/02/17  1:42 PM  Result Value Ref Range Status   Specimen Description   Final    URINE, CLEAN CATCH Performed at [facility], [address] [place], [place] [address]    Special Requests   Final    NONE Performed at [facility], [address] [place], [place] [address]    Culture >=100,000 COLONIES/mL ENTEROBACTER CLOACAE (A)  Final   Report Status 08/04/2017 FINAL  Final   Organism ID, Bacteria ENTEROBACTER CLOACAE (A)  Final      Susceptibility   Enterobacter cloacae - MIC*    CEFAZOLIN RESISTANT Resistant     CEFTRIAXONE <=1 SENSITIVE Sensitive     CIPROFLOXACIN <=0.25 SENSITIVE Sensitive     GENTAMICIN <=1 SENSITIVE Sensitive     IMIPENEM 1 SENSITIVE Sensitive     NITROFURANTOIN 32 SENSITIVE Sensitive     TRIMETH/SULFA <=20 SENSITIVE Sensitive     PIP/TAZO <=4 SENSITIVE Sensitive     * >=100,000 COLONIES/mL ENTEROBACTER CLOACAE     Studies: Ct Chest W Contrast  Result Date: 08/02/2017 CLINICAL DATA:  Initial workup due to presumed CNS metastasis. EXAM: CT CHEST, ABDOMEN, AND PELVIS WITH CONTRAST TECHNIQUE: Multidetector CT imaging of the chest, abdomen and pelvis was performed following the standard protocol during bolus administration of intravenous contrast. CONTRAST:  75 cc ISOVUE-300 IOPAMIDOL (ISOVUE-300) INJECTION  61%, 75mL ISOVUE-300 IOPAMIDOL (ISOVUE-300) INJECTION 61% COMPARISON:  08/02/2017 head CT, chest radiograph 05/28/2017 FINDINGS: CT CHEST FINDINGS Cardiovascular: Retroesophageal course of the internal carotid arteries bilaterally. Conventional branch pattern of the great vessels without significant stenosis. Ectatic ascending thoracic aorta to 3.7 cm. No thoracic aortic dissection. Minimal coronary arteriosclerosis along left main and proximal LAD. No acute pulmonary embolus to the segmental level. Heart size is borderline enlarged without pericardial effusion. Mediastinum/Nodes: Thyroid goiter with coarse calcifications within the left lobe. Subcentimeter cystic nodules are identified the lower poles of both lobes. Patent midline trachea and mainstem bronchi. No mediastinal hilar lymphadenopathy. Lungs/Pleura: No dominant mass or pulmonary consolidation. Multilobar bilateral subpleural areas of atelectasis and/or scarring. Mild peribronchial thickening within both lower lobes with subpleural areas of interstitial prominence and/or fibrosis in the lower lobes bilaterally. Small paraseptal foci of emphysema along the periphery of the right middle and both lower lobes. No  effusion or pneumothorax. Musculoskeletal: Subchondral cystic change of both humeral heads and glenoid fossae. No aggressive osteolytic or blastic disease. Degenerative disc disease T7 through T10. CT ABDOMEN PELVIS FINDINGS Hepatobiliary: The liver enhances homogeneously. The gallbladder is physiologically distended without calculus. No biliary dilatation noted. Pancreas: No enhancing pancreatic mass or ductal dilatation. No inflammation. There appears to be mild side branch ductal ectasia of the pancreatic tail. Tiny side-branch intraductal papillary mucinous neoplasms are not entirely excluded, series 2/54. Spleen: Normal size.  No enhancing mass. Adrenals/Urinary Tract: Normal bilateral adrenal glands. 2.2 cm upper pole right renal cyst, simple  in appearance with smaller subcentimeter hypodense lesions of both kidneys statistically consistent with cysts in the interpolar aspect of both kidneys and right lower pole. No nephrolithiasis, enhancing renal mass nor obstructive uropathy. The urinary bladder demonstrates no calculus or focal mural thickening. No focal mass. Stomach/Bowel: Small hiatal hernia. Decompressed stomach. Normal small bowel rotation is noted. No small-bowel dilatation or mass. No inflammation. The distal terminal ileum are unremarkable. No findings of acute appendicitis. Scattered colonic diverticulosis without acute diverticulitis. No annular constricting lesions. Moderate fecal retention within the descending and sigmoid colon. Vascular/Lymphatic: Mild-to-moderate aortic atherosclerosis. No aneurysm or adenopathy. Reproductive: Hysterectomy.  No adnexal mass. Other: No free air nor free fluid. Musculoskeletal: Degenerative disc disease L4-5. No aggressive osteolytic or blastic disease. Multilevel degenerative facet arthropathy of the lumbar spine. Sclerosis about the pubic symphysis and both SI joints. Lumbosacral transitional vertebral anatomy with pseudoarticulation of L5 with S1 on the left. IMPRESSION: Chest CT: 1. Multinodular goiter. 2. Coronary arteriosclerosis and aortic atherosclerosis. No aneurysm or acute pulmonary embolus. 3. No pulmonary lesions. 4. Minimal subpleural areas of bilateral atelectasis and/or fibrosis. CT AP: 1. Tiny cystic foci in the pancreatic tail may represent ectatic pancreatic duct side branches. Side branch IPMN might also be a possibility. 2. Bilateral renal cysts, the largest measuring 2.2 cm in the upper pole. The remainder are too small to further characterize in the interpolar aspect of both kidneys and right lower pole. 3. Scattered colonic diverticulosis without acute diverticulitis. Masslike abnormality identified. Electronically Signed   By: Ashley Royalty M.D.   On: 08/02/2017 19:16   Mr Jodene Nam  Head Wo Contrast  Result Date: 08/04/2017 CLINICAL DATA:  Recent fall. Seizure. Abnormal neuro exam. Abnormal CT of the head. EXAM: MRI HEAD WITHOUT CONTRAST MRA HEAD WITHOUT CONTRAST TECHNIQUE: Multiplanar, multiecho pulse sequences of the brain and surrounding structures were obtained without intravenous contrast. Angiographic images of the head were obtained using MRA technique without contrast. COMPARISON:  CT head without contrast 08/02/2017. FINDINGS: MRI HEAD FINDINGS Brain: A mass lesion in the medial right parietal lobe measures at least 2.9 x 2.0 x 3.7 cm. There is significant surrounding vasogenic edema. This effaces the sulci and has some mass effect on the right lateral ventricle. There is no hydrocephalus. Restricted diffusion is evident within the lesion and scattered throughout the adjacent white matter. Additional cortical restricted diffusion is present separate from the measured mass lesion. Abnormal T2 signal extends into the splenium of the corpus callosum. There is additional signal surrounding the atrium of the right lateral ventricle. No acute hemorrhage is present. Mild periventricular white matter changes are present otherwise. No significant extra-axial fluid collection is present. The internal auditory canals are within normal limits bilaterally. The brainstem is normal. Remote lacunar infarcts are present in the inferior cerebellum bilaterally. No acute abnormality is present. Vascular: Flow is present in the major intracranial arteries. Skull and upper cervical spine:  The skull base is within normal limits. The craniocervical junction is normal. Sinuses/Orbits: The paranasal sinuses and mastoid air cells are clear. MRA HEAD FINDINGS Atherosclerotic irregularity is present within the cavernous internal carotid arteries bilaterally. There is mild narrowing of approximately 50% at the ophthalmic segment on the right. ICA termini are intact. A moderate proximal right A1 segment stenosis  is present. A moderate proximal left M1 segment stenosis is present. The anterior communicating artery is patent. There is asymmetric attenuation of the distal right M1 segment and right MCA branch vessels compared to the left. The vertebral arteries are codominant. PICA origins are not visualized. Vertebrobasilar junction is normal. Both posterior cerebral arteries originate from basilar tip. There is signal loss in the PCA branches bilaterally. IMPRESSION: 1. Infiltrative mass lesion in the medial right parietal lobe measures at least 2.9 x 2.0 x 3.7 cm. 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 MRI with contrast could be used for further evaluation if clinically indicated. T2 signal changes separate from the measured lesion likely represent infiltrative disease is well. 2. Mass effect from the primary lesion and surrounding vasogenic edema without hydrocephalus or downward herniation. 3. Mild stenosis involving the ophthalmic segment of the right internal carotid artery. 4. Moderate right A1 and left M1 proximal stenoses. 5. Asymmetric attenuation of the distal right M1 segment and right MCA branch vessels compared to the left. Electronically Signed   By: San Morelle M.D.   On: 08/04/2017 08:57   Mr Brain Wo Contrast  Result Date: 08/04/2017 CLINICAL DATA:  Recent fall. Seizure. Abnormal neuro exam. Abnormal CT of the head. EXAM: MRI HEAD WITHOUT CONTRAST MRA HEAD WITHOUT CONTRAST TECHNIQUE: Multiplanar, multiecho pulse sequences of the brain and surrounding structures were obtained without intravenous contrast. Angiographic images of the head were obtained using MRA technique without contrast. COMPARISON:  CT head without contrast 08/02/2017. FINDINGS: MRI HEAD FINDINGS Brain: A mass lesion in the medial right parietal lobe measures at least 2.9 x 2.0 x 3.7 cm. There is  significant surrounding vasogenic edema. This effaces the sulci and has some mass effect on the right lateral ventricle. There is no hydrocephalus. Restricted diffusion is evident within the lesion and scattered throughout the adjacent white matter. Additional cortical restricted diffusion is present separate from the measured mass lesion. Abnormal T2 signal extends into the splenium of the corpus callosum. There is additional signal surrounding the atrium of the right lateral ventricle. No acute hemorrhage is present. Mild periventricular white matter changes are present otherwise. No significant extra-axial fluid collection is present. The internal auditory canals are within normal limits bilaterally. The brainstem is normal. Remote lacunar infarcts are present in the inferior cerebellum bilaterally. No acute abnormality is present. Vascular: Flow is present in the major intracranial arteries. Skull and upper cervical spine: The skull base is within normal limits. The craniocervical junction is normal. Sinuses/Orbits: The paranasal sinuses and mastoid air cells are clear. MRA HEAD FINDINGS Atherosclerotic irregularity is present within the cavernous internal carotid arteries bilaterally. There is mild narrowing of approximately 50% at the ophthalmic segment on the right. ICA termini are intact. A moderate proximal right A1 segment stenosis is present. A moderate proximal left M1 segment stenosis is present. The anterior communicating artery is patent. There is asymmetric attenuation of the distal right M1 segment and right MCA branch vessels compared to the left. The vertebral arteries are codominant. PICA origins are  not visualized. Vertebrobasilar junction is normal. Both posterior cerebral arteries originate from basilar tip. There is signal loss in the PCA branches bilaterally. IMPRESSION: 1. Infiltrative mass lesion in the medial right parietal lobe measures at least 2.9 x 2.0 x 3.7 cm. 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 MRI with contrast could be used for further evaluation if clinically indicated. T2 signal changes separate from the measured lesion likely represent infiltrative disease is well. 2. Mass effect from the primary lesion and surrounding vasogenic edema without hydrocephalus or downward herniation. 3. Mild stenosis involving the ophthalmic segment of the right internal carotid artery. 4. Moderate right A1 and left M1 proximal stenoses. 5. Asymmetric attenuation of the distal right M1 segment and right MCA branch vessels compared to the left. Electronically Signed   By: San Morelle M.D.   On: 08/04/2017 08:57   Ct Abdomen Pelvis W Contrast  Result Date: 08/02/2017 CLINICAL DATA:  Initial workup due to presumed CNS metastasis. EXAM: CT CHEST, ABDOMEN, AND PELVIS WITH CONTRAST TECHNIQUE: Multidetector CT imaging of the chest, abdomen and pelvis was performed following the standard protocol during bolus administration of intravenous contrast. CONTRAST:  75 cc ISOVUE-300 IOPAMIDOL (ISOVUE-300) INJECTION 61%, 68mL ISOVUE-300 IOPAMIDOL (ISOVUE-300) INJECTION 61% COMPARISON:  08/02/2017 head CT, chest radiograph 05/28/2017 FINDINGS: CT CHEST FINDINGS Cardiovascular: Retroesophageal course of the internal carotid arteries bilaterally. Conventional branch pattern of the great vessels without significant stenosis. Ectatic ascending thoracic aorta to 3.7 cm. No thoracic aortic dissection. Minimal coronary arteriosclerosis along left main and proximal LAD. No acute pulmonary embolus to the segmental level. Heart size is borderline enlarged without pericardial effusion. Mediastinum/Nodes: Thyroid goiter with coarse calcifications within the left lobe. Subcentimeter cystic nodules are identified the lower poles of both lobes. Patent midline trachea and mainstem bronchi. No  mediastinal hilar lymphadenopathy. Lungs/Pleura: No dominant mass or pulmonary consolidation. Multilobar bilateral subpleural areas of atelectasis and/or scarring. Mild peribronchial thickening within both lower lobes with subpleural areas of interstitial prominence and/or fibrosis in the lower lobes bilaterally. Small paraseptal foci of emphysema along the periphery of the right middle and both lower lobes. No effusion or pneumothorax. Musculoskeletal: Subchondral cystic change of both humeral heads and glenoid fossae. No aggressive osteolytic or blastic disease. Degenerative disc disease T7 through T10. CT ABDOMEN PELVIS FINDINGS Hepatobiliary: The liver enhances homogeneously. The gallbladder is physiologically distended without calculus. No biliary dilatation noted. Pancreas: No enhancing pancreatic mass or ductal dilatation. No inflammation. There appears to be mild side branch ductal ectasia of the pancreatic tail. Tiny side-branch intraductal papillary mucinous neoplasms are not entirely excluded, series 2/54. Spleen: Normal size.  No enhancing mass. Adrenals/Urinary Tract: Normal bilateral adrenal glands. 2.2 cm upper pole right renal cyst, simple in appearance with smaller subcentimeter hypodense lesions of both kidneys statistically consistent with cysts in the interpolar aspect of both kidneys and right lower pole. No nephrolithiasis, enhancing renal mass nor obstructive uropathy. The urinary bladder demonstrates no calculus or focal mural thickening. No focal mass. Stomach/Bowel: Small hiatal hernia. Decompressed stomach. Normal small bowel rotation is noted. No small-bowel dilatation or mass. No inflammation. The distal terminal ileum are unremarkable. No findings of acute appendicitis. Scattered colonic diverticulosis without acute diverticulitis. No annular constricting lesions. Moderate fecal retention within the descending and sigmoid colon. Vascular/Lymphatic: Mild-to-moderate aortic  atherosclerosis. No aneurysm or adenopathy. Reproductive: Hysterectomy.  No adnexal mass. Other: No free air nor free fluid. Musculoskeletal:  Degenerative disc disease L4-5. No aggressive osteolytic or blastic disease. Multilevel degenerative facet arthropathy of the lumbar spine. Sclerosis about the pubic symphysis and both SI joints. Lumbosacral transitional vertebral anatomy with pseudoarticulation of L5 with S1 on the left. IMPRESSION: Chest CT: 1. Multinodular goiter. 2. Coronary arteriosclerosis and aortic atherosclerosis. No aneurysm or acute pulmonary embolus. 3. No pulmonary lesions. 4. Minimal subpleural areas of bilateral atelectasis and/or fibrosis. CT AP: 1. Tiny cystic foci in the pancreatic tail may represent ectatic pancreatic duct side branches. Side branch IPMN might also be a possibility. 2. Bilateral renal cysts, the largest measuring 2.2 cm in the upper pole. The remainder are too small to further characterize in the interpolar aspect of both kidneys and right lower pole. 3. Scattered colonic diverticulosis without acute diverticulitis. Masslike abnormality identified. Electronically Signed   By: Ashley Royalty M.D.   On: 08/02/2017 19:16   Scheduled Meds: . amLODipine  2.5 mg Oral Daily  . carvedilol  6.25 mg Oral BID WC  . enoxaparin (LOVENOX) injection  40 mg Subcutaneous Q24H  . levothyroxine  150 mcg Oral QAC breakfast  . senna-docusate  1 tablet Oral BID   Continuous Infusions: . sodium chloride 50 mL/hr at 08/03/17 1843  . levETIRAcetam Stopped (08/04/17 0422)  . levofloxacin (LEVAQUIN) IV Stopped (08/03/17 1243)    Principal Problem:   Hypercalcemia of malignancy Active Problems:   Lesion of parietal lobe of brain   Gait abnormality   Dehydration   Hyponatremia   ARF (acute renal failure) ([place])   Uncontrolled type 2 diabetes mellitus with complication, without long-term current use of insulin ([facility])   HTN (hypertension)   Hypothyroidism   Osteoporosis    Hyperparathyroidism ([place])   UTI (urinary tract infection)   At maximum risk for fall   Convulsions ([place])  Time spent:   Irwin Brakeman, MD, FAAFP [facility] Pager [phone]  If 7PM-7AM, please contact night-coverage [email or web address] Password [email or web address] 08/04/2017, 9:07 AM    LOS: 2 days

## 2017-08-04 NOTE — Clinical Social Work Note (Signed)
Clinical Social Work Assessment  Patient Details  Name: Cynthia Marks MRN: [id number] Date of Birth: 01/19/1933  Date of referral:                  Reason for consult:  Facility Placement                Permission sought to share information with:    Permission granted to share information::     Name::        Agency::     Relationship::     Contact Information:  sister in law was bedside.   Housing/Transportation Living arrangements for the past 2 months:  [place] of Information:  Patient, Outpatient Provider Patient Interpreter Needed:  None Criminal Activity/Legal Involvement Pertinent to Current Situation/Hospitalization:  No - Comment as needed Significant Relationships:  Spouse Lives with:  Spouse Do you feel safe going back to the place where you live?  Yes Need for family participation in patient care:  Yes (Comment)  Care giving concerns:  Patient's husband and sister in law are her caregivers.    Social Worker assessment / plan:  At baseline, patient requires assistance with all ADL including feeding. Patient has macular degeneration.  Patient is agreeable to STR at SNF.   Employment status:  Retired Forensic scientist:  [employer] PT Recommendations:  [place] / Referral to community resources:  [place]  Patient/Family's Response to care:  Patient is agreeable to STR at [facility]   Patient/Family's Understanding of and Emotional Response to Diagnosis, Current Treatment, and Prognosis:  Patient understands her diagnosis, treatment and prognosis and feels that STR at SNF is most appropriate.    Emotional Assessment Appearance:  Appears stated age Attitude/Demeanor/Rapport:    Affect (typically observed):  Accepting Orientation:  Oriented to Self, Oriented to Place, Oriented to  Time, Oriented to Situation Alcohol / Substance use:  Not Applicable Psych involvement (Current and /or in the community):  No  (Comment)  Discharge Needs  Concerns to be addressed:  Discharge Planning Concerns Readmission within the last 30 days:  No Current discharge risk:  None Barriers to Discharge:  No Barriers Identified   Ihor Gully, LCSW 08/04/2017, 4:11 PM

## 2017-08-04 NOTE — Care Management Important Message (Signed)
Important Message  Patient Details  Name: Cynthia Marks MRN: [id number] Date of Birth: 26-May-1932   Medicare Important Message Given:  Yes    Shelda Altes 08/04/2017, 11:34 AM

## 2017-08-04 NOTE — NC FL2 (Deleted)
[place] LEVEL OF CARE SCREENING TOOL     IDENTIFICATION  Patient Name: Cynthia Marks Birthdate: [birth year] Sex: female Admission Date (Current Location): 08/02/2017  [facility] and [place] Number:  [employer] and Address:  [place] [address] [place]      Provider Number: [id number]  Attending Physician Name and Address:  Murlean Iba, MD  Relative Name and Phone Number:       Current Level of Care: Hospital Recommended Level of Care: [place] Prior Approval Number:    Date Approved/Denied:   PASRR Number: [id number]  Discharge Plan: SNF    Current Diagnoses: Patient Active Problem List   Diagnosis Date Noted  . Hypercalcemia of malignancy 08/02/2017  . Lesion of parietal lobe of brain 08/02/2017  . UTI (urinary tract infection) 08/02/2017  . At maximum risk for fall 08/02/2017  . Convulsions ([place]) 08/02/2017  . Hyperparathyroidism ([place]) 11/25/2016  . Hypercalcemia 08/09/2016  . Osteoporosis 10/08/2013  . Hyperlipemia 11/30/2012  . Arthritis of knee, degenerative 09/23/2012  . Knee bursitis 09/23/2012  . Bacteremia 08/13/2012  . Acute pyelonephritis 08/11/2012  . Dehydration 08/11/2012  . Hyponatremia 08/11/2012  . ARF (acute renal failure) ([place]) 08/11/2012  . Uncontrolled type 2 diabetes mellitus with complication, without long-term current use of insulin ([place]) 08/11/2012  . HTN (hypertension) 08/11/2012  . Hypothyroidism 08/11/2012  . Gait abnormality 04/22/2012  . Ankle fracture, bimalleolar, closed 12/10/2011  . Skin breakdown 12/10/2011    Orientation RESPIRATION BLADDER Height & Weight     Self, Time, Situation, Place  Normal Incontinent Weight: 200 lb (90.7 kg) Height:  5\' 8"  (172.7 cm)  BEHAVIORAL SYMPTOMS/MOOD NEUROLOGICAL BOWEL NUTRITION STATUS      Continent Diet(Heart healthy. )  AMBULATORY STATUS COMMUNICATION OF NEEDS Skin   Extensive Assist Verbally  Normal                       Personal Care Assistance Level of Assistance  Bathing, Feeding, Dressing Bathing Assistance: Limited assistance Feeding assistance: Limited assistance Dressing Assistance: Limited assistance     Functional Limitations Info  Sight, Hearing, Speech Sight Info: Adequate Hearing Info: Adequate Speech Info: Adequate    SPECIAL CARE FACTORS FREQUENCY  PT (By licensed PT)     PT Frequency: 5x/week              Contractures Contractures Info: Not present    Additional Factors Info  Code Status, Allergies Code Status Info: Full Code Allergies Info: Aspirin, Ibuprofen, Penicillins           Current Medications (08/04/2017):  This is the current hospital active medication list Current Facility-Administered Medications  Medication Dose Route Frequency Provider Last Rate Last Dose  . acetaminophen (TYLENOL) tablet 650 mg  650 mg Oral Q6H PRN Johnson, Clanford L, MD   650 mg at 08/02/17 1409   Or  . acetaminophen (TYLENOL) suppository 650 mg  650 mg Rectal Q6H PRN Johnson, Clanford L, MD      . amLODipine (NORVASC) tablet 2.5 mg  2.5 mg Oral Daily Johnson, Clanford L, MD   2.5 mg at 08/04/17 0240  . carvedilol (COREG) tablet 6.25 mg  6.25 mg Oral BID WC Johnson, Clanford L, MD   6.25 mg at 08/04/17 0939  . dexamethasone (DECADRON) injection 4 mg  4 mg Intravenous Q6H Johnson, Clanford L, MD      . enoxaparin (LOVENOX) injection 40 mg  40 mg  Subcutaneous Q24H Johnson, Clanford L, MD   40 mg at 08/03/17 1521  . hydrALAZINE (APRESOLINE) injection 10 mg  10 mg Intravenous Q4H PRN Johnson, Clanford L, MD      . HYDROcodone-acetaminophen (NORCO/VICODIN) 5-325 MG per tablet 1 tablet  1 tablet Oral Q6H PRN Wynetta Emery, Clanford L, MD   1 tablet at 08/02/17 1845  . levETIRAcetam (KEPPRA) IVPB 500 mg/100 mL premix  500 mg Intravenous Q12H Murlean Iba, MD   Stopped at 08/04/17 0422  . levofloxacin (LEVAQUIN) IVPB 500 mg  500 mg Intravenous Q24H Wynetta Emery,  Clanford L, MD   Stopped at 08/04/17 1400  . levothyroxine (SYNTHROID, LEVOTHROID) tablet 150 mcg  150 mcg Oral QAC breakfast Wynetta Emery, Clanford L, MD   150 mcg at 08/04/17 0937  . LORazepam (ATIVAN) injection 0.5 mg  0.5 mg Intravenous Q6H PRN Opyd, Ilene Qua, MD   0.5 mg at 08/04/17 0153  . ondansetron (ZOFRAN) tablet 4 mg  4 mg Oral Q6H PRN Johnson, Clanford L, MD       Or  . ondansetron (ZOFRAN) injection 4 mg  4 mg Intravenous Q6H PRN Johnson, Clanford L, MD      . senna-docusate (Senokot-S) tablet 1 tablet  1 tablet Oral BID Murlean Iba, MD   1 tablet at 08/04/17 0814     Discharge Medications: Please see discharge summary for a list of discharge medications.  Relevant Imaging Results:  Relevant Lab Results:   Additional Information SSN [id number] Clydene Pugh, LCSW

## 2017-08-04 NOTE — Consult Note (Signed)
[facility] Consultation Oncology  Name: Cynthia Marks      MRN: [id number]    Location: A319/A319-01  Date: 08/04/2017 Time:7:40 PM   REFERRING PHYSICIAN: Dr. 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  She came to the ER after a second fall at home.  She reports that she has been consistently getting weaker over the last several weeks.  She is accompanied by her husband in the room.  She reports weakness in the left leg while walking with a walker, that led to the fall.  She was found to have twitching movements of left side of the body, and was started on Keppra.  She no longer has those movements.  She denies any headaches or vision changes.  She has some compromised vision from macular degeneration.  She denies any significant weight loss in the last several months.  No personal history of malignancies.  Only family history is her sister has breast cancer.  A CT scan showed right parietal mass.  This was followed with a CT scan of the chest, abdomen and pelvis which did not show any significant malignancy.  An MRI of the brain without contrast confirmed right parietal mass.  PAST MEDICAL HISTORY:   Past Medical History:  Diagnosis Date  . Arthritis    Knees  . Chronic back pain   . Chronic pain of right knee   . Diabetes mellitus   . Gait abnormality   . Hypertension   . Macular degeneration   . Thyroid disease     ALLERGIES: Allergies  Allergen Reactions  . Aspirin   . Ibuprofen     Nausea or HTN  . Penicillins Rash    Has patient had a PCN reaction causing immediate rash, facial/tongue/throat swelling, SOB or lightheadedness with hypotension: unknown Has patient had a PCN reaction causing severe rash involving mucus  membranes or skin necrosis: unknown Has patient had a PCN reaction that required hospitalization: no Has patient had a PCN reaction occurring within the last 10 years: No If all of the above answers are "NO", then may proceed with Cephalosporin use.       MEDICATIONS: I have reviewed the patient's current medications.     PAST SURGICAL HISTORY Past Surgical History:  Procedure Laterality Date  . ABDOMINAL HYSTERECTOMY    . COLONOSCOPY    . SHOULDER SURGERY      FAMILY HISTORY: Family History  Problem Relation Age of Onset  . Heart disease Unknown   . Arthritis Unknown   . Cancer Unknown   . Diabetes Unknown   . Kidney disease Unknown     SOCIAL HISTORY:  reports that she has never smoked. She has never used smokeless tobacco. She reports that she does not drink alcohol or use drugs.  PERFORMANCE STATUS: The patient's performance status is 2 - Symptomatic, <50% confined to bed  PHYSICAL EXAM: Most Recent Vital Signs: Blood pressure (!) 160/110, pulse 87, temperature 98.3 F (36.8 C), resp. rate 19, height 5' 8"  (1.727 m), weight 200 lb (90.7 kg), SpO2 99 %. BP (!) 160/110 (BP Location: Right Arm)   Pulse 87   Temp 98.3 F (36.8 C)   Resp 19   Ht 5' 8"  (1.727 m)   Wt 200 lb (  90.7 kg)   SpO2 99%   BMI 30.41 kg/m   General Appearance:    Alert, cooperative, no distress, appears stated age  Head:    Normocephalic, without obvious abnormality, atraumatic  Eyes:    PERRL, conjunctiva/corneas clear, EOM's intact, fundi    benign, both eyes        Throat:   Lips, mucosa, and tongue normal; teeth and gums normal  Neck:   Supple, symmetrical, trachea midline, no adenopathy;    thyroid:  no enlargement/tenderness/nodules; no carotid   bruit or JVD  Back:     Symmetric, no curvature, ROM normal, no CVA tenderness  Lungs:     Clear to auscultation bilaterally, respirations unlabored  Chest Wall:    No tenderness or deformity   Heart:    Regular rate and rhythm, S1 and S2  normal, no murmur, rub   or gallop  Breast Exam:    Not done  Abdomen:     Soft, non-tender, bowel sounds active all four quadrants,    no masses, no organomegaly  Genitalia:  Not done     Extremities:   Extremities normal, atraumatic, no cyanosis or edema  Pulses:   2+ and symmetric all extremities  Skin:   Skin color, texture, turgor normal, no rashes or lesions  Lymph nodes:   Cervical, supraclavicular, and axillary nodes normal  Neurologic:   CNII-XII intact, normal strength.        LABORATORY DATA:  Results for orders placed or performed during the hospital encounter of 08/02/17 (from the past 48 hour(s))  Comprehensive metabolic panel     Status: Abnormal   Collection Time: 08/03/17  6:49 AM  Result Value Ref Range   Sodium 140 135 - 145 mmol/L   Potassium 4.4 3.5 - 5.1 mmol/L   Chloride 106 101 - 111 mmol/L   CO2 24 22 - 32 mmol/L   Glucose, Bld 90 65 - 99 mg/dL   BUN 25 (H) 6 - 20 mg/dL   Creatinine, Ser 0.97 0.44 - 1.00 mg/dL   Calcium 10.2 8.9 - 10.3 mg/dL   Total Protein 7.4 6.5 - 8.1 g/dL   Albumin 3.4 (L) 3.5 - 5.0 g/dL   AST 18 15 - 41 U/L   ALT 9 (L) 14 - 54 U/L   Alkaline Phosphatase 63 38 - 126 U/L   Total Bilirubin 1.1 0.3 - 1.2 mg/dL   GFR calc non Af Amer 53 (L) >60 mL/min   GFR calc Af Amer >60 >60 mL/min    Comment: (NOTE) The eGFR has been calculated using the CKD EPI equation. This calculation has not been validated in all clinical situations. eGFR's persistently <60 mL/min signify possible Chronic Kidney Disease.    Anion gap 10 5 - 15    Comment: Performed at [facility], [address] [place], [place] [address]  CBC WITH DIFFERENTIAL     Status: Abnormal   Collection Time: 08/03/17  6:49 AM  Result Value Ref Range   WBC 9.6 4.0 - 10.5 K/uL   RBC 3.68 (L) 3.87 - 5.11 MIL/uL   Hemoglobin 11.3 (L) 12.0 - 15.0 g/dL   HCT 35.9 (L) 36.0 - 46.0 %   MCV 97.6 78.0 - 100.0 fL   MCH 30.7 26.0 - 34.0 pg   MCHC 31.5 30.0 - 36.0 g/dL   RDW 13.8 11.5  - 15.5 %   Platelets 267 150 - 400 K/uL   Neutrophils Relative % 77 %   Neutro Abs  7.3 1.7 - 7.7 K/uL   Lymphocytes Relative 16 %   Lymphs Abs 1.5 0.7 - 4.0 K/uL   Monocytes Relative 6 %   Monocytes Absolute 0.6 0.1 - 1.0 K/uL   Eosinophils Relative 1 %   Eosinophils Absolute 0.1 0.0 - 0.7 K/uL   Basophils Relative 0 %   Basophils Absolute 0.0 0.0 - 0.1 K/uL    Comment: Performed at [facility], [address], [place], [place] [address]  Magnesium     Status: None   Collection Time: 08/03/17  6:49 AM  Result Value Ref Range   Magnesium 1.9 1.7 - 2.4 mg/dL    Comment: Performed at [facility], [address], [place], [place] [address]  Comprehensive metabolic panel     Status: Abnormal   Collection Time: 08/04/17  5:29 AM  Result Value Ref Range   Sodium 140 135 - 145 mmol/L   Potassium 4.5 3.5 - 5.1 mmol/L   Chloride 108 101 - 111 mmol/L   CO2 24 22 - 32 mmol/L   Glucose, Bld 97 65 - 99 mg/dL   BUN 20 6 - 20 mg/dL   Creatinine, Ser 0.91 0.44 - 1.00 mg/dL   Calcium 9.6 8.9 - 10.3 mg/dL   Total Protein 6.8 6.5 - 8.1 g/dL   Albumin 3.1 (L) 3.5 - 5.0 g/dL   AST 16 15 - 41 U/L   ALT 9 (L) 14 - 54 U/L   Alkaline Phosphatase 57 38 - 126 U/L   Total Bilirubin 0.5 0.3 - 1.2 mg/dL   GFR calc non Af Amer 57 (L) >60 mL/min   GFR calc Af Amer >60 >60 mL/min    Comment: (NOTE) The eGFR has been calculated using the CKD EPI equation. This calculation has not been validated in all clinical situations. eGFR's persistently <60 mL/min signify possible Chronic Kidney Disease.    Anion gap 8 5 - 15    Comment: Performed at [facility], [address], [place], [place] [address]  Magnesium     Status: None   Collection Time: 08/04/17  5:29 AM  Result Value Ref Range   Magnesium 1.9 1.7 - 2.4 mg/dL    Comment: Performed at [facility], [address] [place], [place] [address]      RADIOGRAPHY: Mr Virgel Paling ZJ Contrast  Result Date: 08/04/2017 CLINICAL DATA:  Recent fall. Seizure.  Abnormal neuro exam. Abnormal CT of the head. EXAM: MRI HEAD WITHOUT CONTRAST MRA HEAD WITHOUT CONTRAST TECHNIQUE: Multiplanar, multiecho pulse sequences of the brain and surrounding structures were obtained without intravenous contrast. Angiographic images of the head were obtained using MRA technique without contrast. COMPARISON:  CT head without contrast 08/02/2017. FINDINGS: MRI HEAD FINDINGS Brain: A mass lesion in the medial right parietal lobe measures at least 2.9 x 2.0 x 3.7 cm. There is significant surrounding vasogenic edema. This effaces the sulci and has some mass effect on the right lateral ventricle. There is no hydrocephalus. Restricted diffusion is evident within the lesion and scattered throughout the adjacent white matter. Additional cortical restricted diffusion is present separate from the measured mass lesion. Abnormal T2 signal extends into the splenium of the corpus callosum. There is additional signal surrounding the atrium of the right lateral ventricle. No acute hemorrhage is present. Mild periventricular white matter changes are present otherwise. No significant extra-axial fluid collection is present. The internal auditory canals are within normal limits bilaterally. The brainstem is normal. Remote lacunar infarcts are present in the inferior cerebellum  bilaterally. No acute abnormality is present. Vascular: Flow is present in the major intracranial arteries. Skull and upper cervical spine: The skull base is within normal limits. The craniocervical junction is normal. Sinuses/Orbits: The paranasal sinuses and mastoid air cells are clear. MRA HEAD FINDINGS Atherosclerotic irregularity is present within the cavernous internal carotid arteries bilaterally. There is mild narrowing of approximately 50% at the ophthalmic segment on the right. ICA termini are intact. A moderate proximal right A1 segment stenosis is present. A moderate proximal left M1 segment stenosis is present. The anterior  communicating artery is patent. There is asymmetric attenuation of the distal right M1 segment and right MCA branch vessels compared to the left. The vertebral arteries are codominant. PICA origins are not visualized. Vertebrobasilar junction is normal. Both posterior cerebral arteries originate from basilar tip. There is signal loss in the PCA branches bilaterally. IMPRESSION: 1. Infiltrative mass lesion in the medial right parietal lobe measures at least 2.9 x 2.0 x 3.7 cm. 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 MRI with contrast could be used for further evaluation if clinically indicated. T2 signal changes separate from the measured lesion likely represent infiltrative disease is well. 2. Mass effect from the primary lesion and surrounding vasogenic edema without hydrocephalus or downward herniation. 3. Mild stenosis involving the ophthalmic segment of the right internal carotid artery. 4. Moderate right A1 and left M1 proximal stenoses. 5. Asymmetric attenuation of the distal right M1 segment and right MCA branch vessels compared to the left. Electronically Signed   By: San Morelle M.D.   On: 08/04/2017 08:57   Mr Brain Wo Contrast  Result Date: 08/04/2017 CLINICAL DATA:  Recent fall. Seizure. Abnormal neuro exam. Abnormal CT of the head. EXAM: MRI HEAD WITHOUT CONTRAST MRA HEAD WITHOUT CONTRAST TECHNIQUE: Multiplanar, multiecho pulse sequences of the brain and surrounding structures were obtained without intravenous contrast. Angiographic images of the head were obtained using MRA technique without contrast. COMPARISON:  CT head without contrast 08/02/2017. FINDINGS: MRI HEAD FINDINGS Brain: A mass lesion in the medial right parietal lobe measures at least 2.9 x 2.0 x 3.7 cm. There is significant surrounding vasogenic edema. This effaces the sulci and has some mass effect on  the right lateral ventricle. There is no hydrocephalus. Restricted diffusion is evident within the lesion and scattered throughout the adjacent white matter. Additional cortical restricted diffusion is present separate from the measured mass lesion. Abnormal T2 signal extends into the splenium of the corpus callosum. There is additional signal surrounding the atrium of the right lateral ventricle. No acute hemorrhage is present. Mild periventricular white matter changes are present otherwise. No significant extra-axial fluid collection is present. The internal auditory canals are within normal limits bilaterally. The brainstem is normal. Remote lacunar infarcts are present in the inferior cerebellum bilaterally. No acute abnormality is present. Vascular: Flow is present in the major intracranial arteries. Skull and upper cervical spine: The skull base is within normal limits. The craniocervical junction is normal. Sinuses/Orbits: The paranasal sinuses and mastoid air cells are clear. MRA HEAD FINDINGS Atherosclerotic irregularity is present within the cavernous internal carotid arteries bilaterally. There is mild narrowing of approximately 50% at the ophthalmic segment on the right. ICA termini are intact. A moderate proximal right A1 segment stenosis is present. A moderate proximal left M1 segment stenosis is present. The anterior communicating artery is patent. There is asymmetric attenuation of the distal  right M1 segment and right MCA branch vessels compared to the left. The vertebral arteries are codominant. PICA origins are not visualized. Vertebrobasilar junction is normal. Both posterior cerebral arteries originate from basilar tip. There is signal loss in the PCA branches bilaterally. IMPRESSION: 1. Infiltrative mass lesion in the medial right parietal lobe measures at least 2.9 x 2.0 x 3.7 cm. 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 MRI with contrast could be used for further evaluation if clinically indicated. T2 signal changes separate from the measured lesion likely represent infiltrative disease is well. 2. Mass effect from the primary lesion and surrounding vasogenic edema without hydrocephalus or downward herniation. 3. Mild stenosis involving the ophthalmic segment of the right internal carotid artery. 4. Moderate right A1 and left M1 proximal stenoses. 5. Asymmetric attenuation of the distal right M1 segment and right MCA branch vessels compared to the left. Electronically Signed   By: San Morelle M.D.   On: 08/04/2017 08:57   Mr Brain W Contrast  Result Date: 08/04/2017 CLINICAL DATA:  Altered mental status for 2 days. Follow-up evaluation. EXAM: MRI HEAD WITH CONTRAST TECHNIQUE: Multiplanar, multiecho pulse sequences of the brain and surrounding structures were obtained with intravenous contrast. CONTRAST:  68m MULTIHANCE GADOBENATE DIMEGLUMINE 529 MG/ML IV SOLN COMPARISON:  MRI of the head without contrast Aug 04, 2017 at 0825 hours FINDINGS: Brain: 3 x 3.4 x 5.4 cm heterogeneously enhancing lobulated RIGHT mesial parietal lobe mass corresponding to previous abnormality. There 2 adjacent nodules within the splenium of the corpus callosum measuring to 18 x 8 mm. In addition, 4 mm nodular enhancement RIGHT inferior frontal cortex. No abnormal extra-axial enhancement. Vascular: Not tailored for evaluation. Skull and upper cervical spine: No abnormal osseous enhancement. Sinuses/Orbits: No abnormal enhancement. Other: None. IMPRESSION: 1. Four suspicious foci of intraparenchymal enhancement, dominant lesion RIGHT parietal lobe 3 x 3.4 x 5.4 cm. 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 Electronically Signed   By: CElon AlasM.D.   On: 08/04/2017 18:50       PATHOLOGY:  none  ASSESSMENT: Right mesial parietal lobe mass, measuring 5.4 x  3.4 x 3 cm, 2 adjacent nodules within the splenium of corpus callosum, 4 mm nodular enhancement in the right inferior frontal cortex.  PLAN: These findings are highly suggestive of multifocal GBM although metastatic disease cannot be ruled out.  CT scan of the chest, abdomen and pelvis did not show any primary.  She did not have any personal history of malignancies.  There is slight mass-effect with surrounding edema without midline shift.  Patient already on Keppra which has controlled movements of the left lower extremity.  I talked to the patient and her husband about the findings on the CT scan and brain MRI.  I have recommended evaluation by neurosurgery.  If biopsy confirms GBM, we will get  MGMT testing.  Questions were encouraged and answered to her satisfaction.  If patient is discharged to a rehab facility, she will be followed up in our office after neurosurgery evaluation.   She knows to call the clinic with any problems, questions or concerns. We can certainly see the patient much sooner if necessary.   Bailei Buist [phone]

## 2017-08-04 NOTE — Plan of Care (Signed)
progressing 

## 2017-08-04 NOTE — Progress Notes (Signed)
OT Cancellation Note  Patient Details Name: Cynthia Marks MRN: [id number] DOB: Aug 13, 1932   Cancelled Treatment:    Reason Eval/Treat Not Completed: Patient at procedure or test/ unavailable Will re-attempt OT evaluation at a later time as able.     Ailene Ravel, OTR/L,CBIS  [phone]  08/04/2017, 9:12 AM

## 2017-08-05 ENCOUNTER — Inpatient Hospital Stay
Admission: RE | Admit: 2017-08-05 | Discharge: 2017-10-16 | Disposition: E | Payer: Medicare Other | Source: Ambulatory Visit | Attending: Internal Medicine | Admitting: Internal Medicine

## 2017-08-05 DIAGNOSIS — N179 Acute kidney failure, unspecified: Secondary | ICD-10-CM | POA: Diagnosis not present

## 2017-08-05 DIAGNOSIS — E875 Hyperkalemia: Secondary | ICD-10-CM | POA: Diagnosis not present

## 2017-08-05 DIAGNOSIS — N10 Acute tubulo-interstitial nephritis: Secondary | ICD-10-CM | POA: Diagnosis not present

## 2017-08-05 DIAGNOSIS — D72829 Elevated white blood cell count, unspecified: Secondary | ICD-10-CM | POA: Diagnosis not present

## 2017-08-05 DIAGNOSIS — F419 Anxiety disorder, unspecified: Secondary | ICD-10-CM | POA: Diagnosis not present

## 2017-08-05 DIAGNOSIS — E871 Hypo-osmolality and hyponatremia: Secondary | ICD-10-CM | POA: Diagnosis not present

## 2017-08-05 DIAGNOSIS — E860 Dehydration: Secondary | ICD-10-CM | POA: Diagnosis not present

## 2017-08-05 DIAGNOSIS — R279 Unspecified lack of coordination: Secondary | ICD-10-CM | POA: Diagnosis not present

## 2017-08-05 DIAGNOSIS — M6281 Muscle weakness (generalized): Secondary | ICD-10-CM | POA: Diagnosis not present

## 2017-08-05 DIAGNOSIS — G939 Disorder of brain, unspecified: Secondary | ICD-10-CM | POA: Diagnosis not present

## 2017-08-05 DIAGNOSIS — I10 Essential (primary) hypertension: Secondary | ICD-10-CM | POA: Diagnosis not present

## 2017-08-05 DIAGNOSIS — W01198D Fall on same level from slipping, tripping and stumbling with subsequent striking against other object, subsequent encounter: Secondary | ICD-10-CM | POA: Diagnosis not present

## 2017-08-05 DIAGNOSIS — E038 Other specified hypothyroidism: Secondary | ICD-10-CM | POA: Diagnosis not present

## 2017-08-05 DIAGNOSIS — Z6830 Body mass index (BMI) 30.0-30.9, adult: Secondary | ICD-10-CM | POA: Diagnosis not present

## 2017-08-05 DIAGNOSIS — Z9181 History of falling: Secondary | ICD-10-CM | POA: Diagnosis not present

## 2017-08-05 DIAGNOSIS — R262 Difficulty in walking, not elsewhere classified: Secondary | ICD-10-CM | POA: Diagnosis not present

## 2017-08-05 DIAGNOSIS — N390 Urinary tract infection, site not specified: Secondary | ICD-10-CM | POA: Diagnosis not present

## 2017-08-05 DIAGNOSIS — E213 Hyperparathyroidism, unspecified: Secondary | ICD-10-CM | POA: Diagnosis not present

## 2017-08-05 DIAGNOSIS — E118 Type 2 diabetes mellitus with unspecified complications: Secondary | ICD-10-CM | POA: Diagnosis not present

## 2017-08-05 DIAGNOSIS — K5900 Constipation, unspecified: Secondary | ICD-10-CM | POA: Diagnosis not present

## 2017-08-05 DIAGNOSIS — Z5189 Encounter for other specified aftercare: Secondary | ICD-10-CM | POA: Diagnosis not present

## 2017-08-05 DIAGNOSIS — C713 Malignant neoplasm of parietal lobe: Secondary | ICD-10-CM | POA: Diagnosis not present

## 2017-08-05 DIAGNOSIS — E119 Type 2 diabetes mellitus without complications: Secondary | ICD-10-CM | POA: Diagnosis not present

## 2017-08-05 DIAGNOSIS — S8011XD Contusion of right lower leg, subsequent encounter: Secondary | ICD-10-CM | POA: Diagnosis not present

## 2017-08-05 DIAGNOSIS — R569 Unspecified convulsions: Secondary | ICD-10-CM | POA: Diagnosis not present

## 2017-08-05 DIAGNOSIS — R609 Edema, unspecified: Secondary | ICD-10-CM | POA: Diagnosis not present

## 2017-08-05 DIAGNOSIS — E1165 Type 2 diabetes mellitus with hyperglycemia: Secondary | ICD-10-CM | POA: Diagnosis not present

## 2017-08-05 DIAGNOSIS — G40109 Localization-related (focal) (partial) symptomatic epilepsy and epileptic syndromes with simple partial seizures, not intractable, without status epilepticus: Secondary | ICD-10-CM | POA: Diagnosis not present

## 2017-08-05 LAB — COMPREHENSIVE METABOLIC PANEL
ALK PHOS: 64 U/L (ref 38–126)
ALT: 9 U/L — ABNORMAL LOW (ref 14–54)
ANION GAP: 6 (ref 5–15)
AST: 18 U/L (ref 15–41)
Albumin: 3.4 g/dL — ABNORMAL LOW (ref 3.5–5.0)
BUN: 24 mg/dL — ABNORMAL HIGH (ref 6–20)
CALCIUM: 9.8 mg/dL (ref 8.9–10.3)
CHLORIDE: 109 mmol/L (ref 101–111)
CO2: 24 mmol/L (ref 22–32)
Creatinine, Ser: 0.93 mg/dL (ref 0.44–1.00)
GFR, EST NON AFRICAN AMERICAN: 56 mL/min — AB (ref >60)
Glucose, Bld: 151 mg/dL — ABNORMAL HIGH (ref 65–99)
Potassium: 4.5 mmol/L (ref 3.5–5.1)
SODIUM: 139 mmol/L (ref 135–145)
Total Bilirubin: 0.5 mg/dL (ref 0.3–1.2)
Total Protein: 7.4 g/dL (ref 6.5–8.1)

## 2017-08-05 LAB — CBC WITH DIFFERENTIAL/PLATELET
Basophils Absolute: 0 10*3/uL (ref 0.0–0.1)
Basophils Relative: 0 %
EOS ABS: 0 10*3/uL (ref 0.0–0.7)
EOS PCT: 0 %
HCT: 37.6 % (ref 36.0–46.0)
Hemoglobin: 11.8 g/dL — ABNORMAL LOW (ref 12.0–15.0)
LYMPHS ABS: 0.8 10*3/uL (ref 0.7–4.0)
LYMPHS PCT: 10 %
MCH: 30.6 pg (ref 26.0–34.0)
MCHC: 31.4 g/dL (ref 30.0–36.0)
MCV: 97.7 fL (ref 78.0–100.0)
MONOS PCT: 1 %
Monocytes Absolute: 0.1 10*3/uL (ref 0.1–1.0)
Neutro Abs: 7.3 10*3/uL (ref 1.7–7.7)
Neutrophils Relative %: 89 %
PLATELETS: 289 10*3/uL (ref 150–400)
RBC: 3.85 MIL/uL — AB (ref 3.87–5.11)
RDW: 13.8 % (ref 11.5–15.5)
WBC: 8.2 10*3/uL (ref 4.0–10.5)

## 2017-08-05 LAB — MAGNESIUM: Magnesium: 2.1 mg/dL (ref 1.7–2.4)

## 2017-08-05 MED ORDER — DEXAMETHASONE 4 MG PO TABS: 4.0000 mg | ORAL_TABLET | Freq: Two times a day (BID) | ORAL | 0 refills | Status: DC

## 2017-08-05 MED ORDER — LEVOFLOXACIN 500 MG PO TABS: 500.0000 mg | ORAL_TABLET | Freq: Every day | ORAL | 0 refills | Status: AC

## 2017-08-05 MED ORDER — LEVETIRACETAM 500 MG PO TABS: 500.0000 mg | ORAL_TABLET | Freq: Two times a day (BID) | ORAL | Status: AC

## 2017-08-05 NOTE — Plan of Care (Signed)
  Problem: Acute Rehab OT Goals (only OT should resolve) Goal: Pt. Will Perform Eating Flowsheets (Taken 07/28/2017 0818) Pt Will Perform Eating: with set-up;sitting Goal: Pt. Will Perform Grooming Flowsheets (Taken 08/06/2017 0818) Pt Will Perform Grooming: with set-up;with min assist;sitting Goal: Pt. Will Transfer To Toilet Flowsheets (Taken 08/13/2017 0818) Pt Will Transfer to Toilet: with min assist;with mod assist;stand pivot transfer;ambulating;regular height toilet;bedside commode Goal: Pt. Will Perform Toileting-Clothing Manipulation Flowsheets (Taken 08/15/2017 0818) Pt Will Perform Toileting - Clothing Manipulation and hygiene: with supervision;with min assist;sitting/lateral leans;sit to/from stand Goal: Pt/Caregiver Will Perform Home Exercise Program Flowsheets (Taken 07/18/2017 0818) Pt/caregiver will Perform Home Exercise Program: Increased strength;Both right and left upper extremity;With Supervision;With minimal assist;With written HEP provided

## 2017-08-05 NOTE — Clinical Social Work Placement (Signed)
   CLINICAL SOCIAL WORK PLACEMENT  NOTE  Date:  07/16/2017  Patient Details  Name: Cynthia Marks MRN: [id number] Date of Birth: [birth year]  Clinical Social Work is seeking post-discharge placement for this patient at the [place] level of care (*CSW will initial, date and re-position this form in  chart as items are completed):  Yes   Patient/family provided with [facility] Department's list of facilities offering this level of care within the geographic area requested by the patient (or if unable, by the patient's family).  Yes   Patient/family informed of their freedom to choose among providers that offer the needed level of care, that participate in Medicare, Medicaid or managed care program needed by the patient, have an available bed and are willing to accept the patient.  Yes   Patient/family informed of [facility]'s ownership interest in [facility] and [facility], as well as of the fact that they are under no obligation to receive care at these facilities.  PASRR submitted to EDS on 07/21/2017     PASRR number received on 08/06/2017     Existing PASRR number confirmed on       FL2 transmitted to all facilities in geographic area requested by pt/family on 07/16/2017     FL2 transmitted to all facilities within larger geographic area on       Patient informed that his/her managed care company has contracts with or will negotiate with certain facilities, including the following:        Yes   Patient/family informed of bed offers received.  Patient chooses bed at [facility]     Physician recommends and patient chooses bed at      Patient to be transferred to [facility] on 07/20/2017.  Patient to be transferred to facility by [facility]     Patient family notified on 08/04/2017 of transfer.  Name of family member notified:  messages left for spouse and sister     PHYSICIAN       Additional Comment:    Facility aware, discharge clinicals sent. LCSW signing off.   _______________________________________________ Ihor Gully, LCSW 07/26/2017, 4:54 PM

## 2017-08-05 NOTE — Plan of Care (Signed)
progressing 

## 2017-08-05 NOTE — Evaluation (Signed)
Occupational Therapy Evaluation Patient Details Name: Cynthia Marks MRN: [id number] DOB: [birth year] Today's Date: 08/04/2017    History of Present Illness Cynthia Marks is a 84 y.o. female who has been becoming increasingly debilitated and weak over the past several months presented to the emergency department after an another fall at home.  She had been seen last week in the ED for another fall.  The patient did not have any acute fractures at that time and was sent home.  She has been following up outpatient and they have been making arrangements for her to have assistance at home.  The patient continues to report that she loses her balance.  With the current episode she reports that she lost her balance on her walker and fell at home she does not know if she hit her head or not.  She complained of initial pain in the left side of the head, bilateral ankles, right hip and left hip.  She also has been concerned about some spontaneous twitching episodes on the left side of her body that she does not seem to be able to control.  She denies shortness of breath and chest pain.  She lives with her husband at home but he is frail and unable to assist her.   Clinical Impression   Pt received supine in bed, agreeable to OT evaluation, husband and sister-in-law present. Pt demonstrates BUE weakness and generalized weakness with grip strength. Pt able to complete self-feeding tasks with set-up of materials and food items. Sister-in-law reporting pt requires assistance with all ADLs at baseline, more recently due to weakness. Recommend SNF on discharge to improve pt safety and independence in ADLs, to improve strength, and reduce caregiver burden. Will continue to follow acutely.    Follow Up Recommendations  SNF    Equipment Recommendations  None recommended by OT       Precautions / Restrictions Precautions Precautions: Fall Restrictions Weight Bearing Restrictions: No      Mobility Bed  Mobility               General bed mobility comments: not completed during evaluation  Transfers                 General transfer comment: not completed        ADL either performed or assessed with clinical judgement   ADL Overall ADL's : Needs assistance/impaired Eating/Feeding: Set up;Bed level Eating/Feeding Details (indicate cue type and reason): Pt able to feed self with set-up via opening packages and preparing coffee/peeling banana                 Lower Body Dressing: Total assistance;Bed level                 General ADL Comments: Pt requiring increased level of assistance with all ADLs due to increased weakness      Vision Baseline Vision/History: Macular Degeneration Patient Visual Report: No change from baseline Additional Comments: pt is able to see people and objects with adequate lighting            Pertinent Vitals/Pain Pain Assessment: No/denies pain     Hand Dominance Right   Extremity/Trunk Assessment Upper Extremity Assessment Upper Extremity Assessment: Generalized weakness(grossly 3-/5 throughout BUE)   Lower Extremity Assessment Lower Extremity Assessment: Defer to PT evaluation       Communication Communication Communication: No difficulties   Cognition Arousal/Alertness: Awake/alert Behavior During Therapy: WFL for tasks assessed/performed Overall Cognitive Status: Within Functional  Limits for tasks assessed                                                Home Living   Living Arrangements: Spouse/significant other Available Help at Discharge: Family;Personal care attendant;Available PRN/intermittently Type of Home: House Home Access: Ramped entrance     Home Layout: One level     Bathroom Shower/Tub: Teacher, early years/pre: Standard     Home Equipment: Environmental consultant - 2 wheels;Shower seat;Bedside commode;Grab bars - tub/shower;Cane - quad          Prior  Functioning/Environment Level of Independence: Needs assistance  Gait / Transfers Assistance Needed: household gait using RW ADL's / Homemaking Assistance Needed: assisted by home aides PRN; husband assists with ADLs            OT Problem List: Decreased strength;Decreased activity tolerance;Impaired balance (sitting and/or standing);Impaired UE functional use      OT Treatment/Interventions: Self-care/ADL training;Therapeutic exercise;Therapeutic activities;Patient/family education;Visual/perceptual remediation/compensation    OT Goals(Current goals can be found in the care plan section) Acute Rehab OT Goals Patient Stated Goal: return home after rehab OT Goal Formulation: With patient Time For Goal Achievement: 08/19/17 Potential to Achieve Goals: Good  OT Frequency: Min 2X/week    End of Session    Activity Tolerance: Patient tolerated treatment well Patient left: in bed;with call bell/phone within reach;with bed alarm set;with family/visitor present  OT Visit Diagnosis: Muscle weakness (generalized) (M62.81)                Time: 3235-5732 OT Time Calculation (min): 26 min Charges:  OT General Charges $OT Visit: 1 Visit OT Evaluation $OT Eval Moderate Complexity: Phelps, OTR/L  [phone] 08/03/2017, 8:15 AM

## 2017-08-05 NOTE — Progress Notes (Signed)
Removed IV-clean, dry, intact. Called report to Dominica at [facility]. Robin and I transported stable  pt, husband, and sister to [facility].

## 2017-08-05 NOTE — Discharge Summary (Addendum)
Physician Discharge Summary  Cynthia Marks MHD:[id number] DOB: 29-Sep-1932 DOA: 08/02/2017  PCP: Kathyrn Drown, MD  Admit date: 08/02/2017 Discharge date: 07/20/2017  Admitted From: Home Disposition: Skilled nursing facility  Recommendations for Outpatient Follow-up:  1. Follow up with PCP in 1-2 weeks 2. Please obtain BMP/CBC in one week 3. Follow-up with neurosurgery, Dr. Ellene Route on 5/22 at 1:15pm 4. Patient will follow-up with oncology, to be scheduled by oncology.  Home Health: Equipment/Devices:  Discharge Condition: Stable CODE STATUS: Full code Diet recommendation: Heart Healthy  Brief/Interim Summary: 84 year old female with a history of hypothyroidism and hypertension, has been falling more frequently and was becoming increasingly debilitated.  She presented to the emergency room where imaging of her brain indicated a right parietal brain lesion.  She was admitted for further treatment.  Discharge Diagnoses:  Principal Problem:   Hypercalcemia of malignancy Active Problems:   Gait abnormality   Dehydration   Hyponatremia   ARF (acute renal failure) ([place])   Uncontrolled type 2 diabetes mellitus with complication, without long-term current use of insulin ([facility])   HTN (hypertension)   Hypothyroidism   Osteoporosis   Hyperparathyroidism ([place])   Lesion of parietal lobe of brain   UTI (urinary tract infection)   At maximum risk for fall   Convulsions ([place])  1. New right parietal brain lesion, concerning for malignancy.  Patient seen by oncology and MRI films reviewed with neurosurgery.  There is concern for underlying malignancy.  She had staging CT scans of her chest abdomen and pelvis that did not show any clear primary source.  She will likely need further work-up with biopsy.  Case reviewed with Dr. Ellene Route who will see the patient tomorrow in his office.  We will discuss further biopsies and possible surgery, if the patient is a candidate.  Since imaging had  indicated mild edema, she will be continued on steroids. 2. Partial seizures/convulsions involving the left side of her body.  Related to right parietal lesion.  Patient started on Keppra with resolution of symptoms.  She will be continued on Keppra. 3. Hypercalcemia of malignancy.  Treated with IV fluids as well as received a dose of Zometa.  Follow-up calcium levels have improved. 4. Hyperparathyroidism.  Followed by Dr. Dorris Fetch.  Continue follow-up as scheduled. 5. Hypothyroidism/goiter.  Continue on home dose of levothyroxine. 6. Enterobacter UTI.  Treated with levofloxacin. 7. Acute renal failure.  Likely related to dehydration/hypercalcemia.  Improved with IV hydration. 8. Diabetes.  Diet controlled.  Patient had episodic hyperglycemia, but mostly blood sugars were stable. 9. Frequent falls.  Seen by physical therapy recommended skilled nursing facility placement.  Discharge Instructions  Discharge Instructions    Diet - low sodium heart healthy   Complete by:  As directed    Increase activity slowly   Complete by:  As directed      Allergies as of 07/26/2017      Reactions   Aspirin    Ibuprofen    Nausea or HTN   Penicillins Rash   Has patient had a PCN reaction causing immediate rash, facial/tongue/throat swelling, SOB or lightheadedness with hypotension: unknown Has patient had a PCN reaction causing severe rash involving mucus membranes or skin necrosis: unknown Has patient had a PCN reaction that required hospitalization: no Has patient had a PCN reaction occurring within the last 10 years: No If all of the above answers are "NO", then may proceed with Cephalosporin use.      Medication List    TAKE  these medications   acetaminophen 650 MG CR tablet Commonly known as:  TYLENOL Take 650 mg by mouth every 8 (eight) hours as needed for pain.   amLODipine 2.5 MG tablet Commonly known as:  NORVASC TAKE 1 TABLET BY MOUTH EVERY DAY   beta carotene w/minerals tablet Take 1  tablet by mouth daily. Reported on 07/24/2015   carvedilol 12.5 MG tablet Commonly known as:  COREG Take 1/2 tablet by mouth twice daily.   [employer] D3 5000 units capsule Generic drug:  Cholecalciferol TAKE 1 CAPSULE (5,000 UNITS TOTAL) BY MOUTH DAILY.   dexamethasone 4 MG tablet Commonly known as:  DECADRON Take 1 tablet (4 mg total) by mouth 2 (two) times daily with a meal.   levETIRAcetam 500 MG tablet Commonly known as:  KEPPRA Take 1 tablet (500 mg total) by mouth 2 (two) times daily.   levofloxacin 500 MG tablet Commonly known as:  LEVAQUIN Take 1 tablet (500 mg total) by mouth daily for 4 days.   levothyroxine 150 MCG tablet Commonly known as:  SYNTHROID, LEVOTHROID Take 2 tablets on mon, wed, and Friday. Take one and a half tablets on tues, thurs, sat, and sun What changed:    how much to take  how to take this  when to take this  additional instructions       Contact information for follow-up providers    Kristeen Miss, MD Follow up on 08/06/2017.   Specialty:  Neurosurgery Why:  1:15pm Contact information: [address] [place] [address] [phone]        Derek Jack, MD Follow up.   Specialty:  Hematology Why:  they will notify you of appointment Contact information: [address] [place] [address] [phone]            Contact information for after-discharge care    [facility] .   Service:  Skilled Nursing Contact information: [address] [address] [phone]                 Allergies  Allergen Reactions  . Aspirin   . Ibuprofen     Nausea or HTN  . Penicillins Rash    Has patient had a PCN reaction causing immediate rash, facial/tongue/throat swelling, SOB or lightheadedness with hypotension: unknown Has patient had a PCN reaction causing severe rash involving mucus membranes or skin necrosis: unknown Has patient had a PCN  reaction that required hospitalization: no Has patient had a PCN reaction occurring within the last 10 years: No If all of the above answers are "NO", then may proceed with Cephalosporin use.     Consultations:  Oncology   Procedures/Studies: Dg Tibia/fibula Right  Result Date: 07/22/2017 CLINICAL DATA:  Fall in shower with right leg pain. Initial encounter. EXAM: RIGHT TIBIA AND FIBULA - 2 VIEW COMPARISON:  12/04/2011 FINDINGS: Sizable knee joint effusion that was also seen on the 2018 knee film. Remote bimalleolar fractures with completed healing. No acute fracture or malalignment is seen. Nonspecific soft tissue swelling about the ankle. IMPRESSION: 1. No acute finding. 2. Knee joint effusion also seen on 02/19/2017 radiograph. Electronically Signed   By: Monte Fantasia M.D.   On: 07/22/2017 13:53   Dg Ankle Complete Left  Result Date: 08/02/2017 CLINICAL DATA:  Fall in bathroom with left ankle pain. EXAM: LEFT ANKLE COMPLETE - 3+ VIEW COMPARISON:  None. FINDINGS: Mild soft tissue swelling over the ankle. Ankle mortise  is within normal. No evidence of acute fracture or dislocation. IMPRESSION: No acute fracture. Electronically Signed   By: Marin Olp M.D.   On: 08/02/2017 08:47   Dg Ankle Complete Right  Result Date: 08/02/2017 CLINICAL DATA:  Fall in bathroom with right ankle pain. EXAM: RIGHT ANKLE - COMPLETE 3+ VIEW COMPARISON:  07/22/2017 FINDINGS: Examination demonstrates minimal soft tissue swelling over the ankle. No evidence of acute fracture or dislocation. Old healed distal fibular and medial malleolar fractures. Slight stable widening of the medial aspect of the ankle mortise. Small inferior calcaneal spur. IMPRESSION: No acute fracture. Old bimalleolar fractures and stable widening of the medial aspect of the ankle mortise. Electronically Signed   By: Marin Olp M.D.   On: 08/02/2017 08:46   Dg Ankle Complete Right  Result Date: 07/22/2017 CLINICAL DATA:  Fall in shower  with right leg pain. Initial encounter. EXAM: RIGHT ANKLE - COMPLETE 3+ VIEW COMPARISON:  03/16/2012 FINDINGS: Soft tissue swelling. Remote medial and lateral malleolus fractures that are healed. Degenerative spurring at the ankle joint. Negative for joint effusion. Heel spur. Osteopenia. IMPRESSION: 1. Soft tissue swelling without acute osseous finding. 2. Remote and healed bimalleolar fractures. Electronically Signed   By: Monte Fantasia M.D.   On: 07/22/2017 13:49   Ct Head Wo Contrast  Result Date: 08/02/2017 CLINICAL DATA:  Fall.  Hit left side of head. EXAM: CT HEAD WITHOUT CONTRAST CT CERVICAL SPINE WITHOUT CONTRAST TECHNIQUE: Multidetector CT imaging of the head and cervical spine was performed following the standard protocol without intravenous contrast. Multiplanar CT image reconstructions of the cervical spine were also generated. COMPARISON:  CT head dated December 04, 2011. FINDINGS: CT HEAD FINDINGS Brain: There is new hypodensity within the right parietal lobe surrounding a questionable mass lesion (series 4, image 52; series 5, image 25). There is mild effacement of the adjacent sulci. No evidence of acute infarction, hemorrhage, hydrocephalus, or extra-axial collection. Stable mild cerebral atrophy and chronic microvascular ischemic changes. Vascular: Atherosclerotic vascular calcification of the carotid siphons. No hyperdense vessel. Skull: Negative for fracture or focal lesion. Sinuses/Orbits: No acute finding. Other: None. CT CERVICAL SPINE FINDINGS Alignment: Normal. Skull base and vertebrae: No acute fracture. No primary bone lesion or focal pathologic process. Soft tissues and spinal canal: No prevertebral fluid or swelling. No visible canal hematoma. Disc levels: Mild disc height loss from C4-C5 through C6-C7. Moderate uncovertebral hypertrophy at C5-C6 and C6-C7. Severe facet arthropathy throughout the cervical spine with fusion of the right C4-C5 and left C3-C4 facet joints. Upper  chest: Negative. Other: None. IMPRESSION: 1. New questionable mass lesion in the right parietal lobe with surrounding vasogenic edema. Recommend contrast-enhanced MRI of the brain for further evaluation. 2. No evidence of traumatic injury within the head or cervical spine. Electronically Signed   By: Titus Dubin M.D.   On: 08/02/2017 08:29   Ct Chest W Contrast  Result Date: 08/02/2017 CLINICAL DATA:  Initial workup due to presumed CNS metastasis. EXAM: CT CHEST, ABDOMEN, AND PELVIS WITH CONTRAST TECHNIQUE: Multidetector CT imaging of the chest, abdomen and pelvis was performed following the standard protocol during bolus administration of intravenous contrast. CONTRAST:  75 cc ISOVUE-300 IOPAMIDOL (ISOVUE-300) INJECTION 61%, 70mL ISOVUE-300 IOPAMIDOL (ISOVUE-300) INJECTION 61% COMPARISON:  08/02/2017 head CT, chest radiograph 05/28/2017 FINDINGS: CT CHEST FINDINGS Cardiovascular: Retroesophageal course of the internal carotid arteries bilaterally. Conventional branch pattern of the great vessels without significant stenosis. Ectatic ascending thoracic aorta to 3.7 cm. No thoracic aortic dissection. Minimal coronary arteriosclerosis  along left main and proximal LAD. No acute pulmonary embolus to the segmental level. Heart size is borderline enlarged without pericardial effusion. Mediastinum/Nodes: Thyroid goiter with coarse calcifications within the left lobe. Subcentimeter cystic nodules are identified the lower poles of both lobes. Patent midline trachea and mainstem bronchi. No mediastinal hilar lymphadenopathy. Lungs/Pleura: No dominant mass or pulmonary consolidation. Multilobar bilateral subpleural areas of atelectasis and/or scarring. Mild peribronchial thickening within both lower lobes with subpleural areas of interstitial prominence and/or fibrosis in the lower lobes bilaterally. Small paraseptal foci of emphysema along the periphery of the right middle and both lower lobes. No effusion or  pneumothorax. Musculoskeletal: Subchondral cystic change of both humeral heads and glenoid fossae. No aggressive osteolytic or blastic disease. Degenerative disc disease T7 through T10. CT ABDOMEN PELVIS FINDINGS Hepatobiliary: The liver enhances homogeneously. The gallbladder is physiologically distended without calculus. No biliary dilatation noted. Pancreas: No enhancing pancreatic mass or ductal dilatation. No inflammation. There appears to be mild side branch ductal ectasia of the pancreatic tail. Tiny side-branch intraductal papillary mucinous neoplasms are not entirely excluded, series 2/54. Spleen: Normal size.  No enhancing mass. Adrenals/Urinary Tract: Normal bilateral adrenal glands. 2.2 cm upper pole right renal cyst, simple in appearance with smaller subcentimeter hypodense lesions of both kidneys statistically consistent with cysts in the interpolar aspect of both kidneys and right lower pole. No nephrolithiasis, enhancing renal mass nor obstructive uropathy. The urinary bladder demonstrates no calculus or focal mural thickening. No focal mass. Stomach/Bowel: Small hiatal hernia. Decompressed stomach. Normal small bowel rotation is noted. No small-bowel dilatation or mass. No inflammation. The distal terminal ileum are unremarkable. No findings of acute appendicitis. Scattered colonic diverticulosis without acute diverticulitis. No annular constricting lesions. Moderate fecal retention within the descending and sigmoid colon. Vascular/Lymphatic: Mild-to-moderate aortic atherosclerosis. No aneurysm or adenopathy. Reproductive: Hysterectomy.  No adnexal mass. Other: No free air nor free fluid. Musculoskeletal: Degenerative disc disease L4-5. No aggressive osteolytic or blastic disease. Multilevel degenerative facet arthropathy of the lumbar spine. Sclerosis about the pubic symphysis and both SI joints. Lumbosacral transitional vertebral anatomy with pseudoarticulation of L5 with S1 on the left.  IMPRESSION: Chest CT: 1. Multinodular goiter. 2. Coronary arteriosclerosis and aortic atherosclerosis. No aneurysm or acute pulmonary embolus. 3. No pulmonary lesions. 4. Minimal subpleural areas of bilateral atelectasis and/or fibrosis. CT AP: 1. Tiny cystic foci in the pancreatic tail may represent ectatic pancreatic duct side branches. Side branch IPMN might also be a possibility. 2. Bilateral renal cysts, the largest measuring 2.2 cm in the upper pole. The remainder are too small to further characterize in the interpolar aspect of both kidneys and right lower pole. 3. Scattered colonic diverticulosis without acute diverticulitis. Masslike abnormality identified. Electronically Signed   By: Ashley Royalty M.D.   On: 08/02/2017 19:16   Ct Cervical Spine Wo Contrast  Result Date: 08/02/2017 CLINICAL DATA:  Fall.  Hit left side of head. EXAM: CT HEAD WITHOUT CONTRAST CT CERVICAL SPINE WITHOUT CONTRAST TECHNIQUE: Multidetector CT imaging of the head and cervical spine was performed following the standard protocol without intravenous contrast. Multiplanar CT image reconstructions of the cervical spine were also generated. COMPARISON:  CT head dated December 04, 2011. FINDINGS: CT HEAD FINDINGS Brain: There is new hypodensity within the right parietal lobe surrounding a questionable mass lesion (series 4, image 52; series 5, image 25). There is mild effacement of the adjacent sulci. No evidence of acute infarction, hemorrhage, hydrocephalus, or extra-axial collection. Stable mild cerebral atrophy and chronic microvascular ischemic  changes. Vascular: Atherosclerotic vascular calcification of the carotid siphons. No hyperdense vessel. Skull: Negative for fracture or focal lesion. Sinuses/Orbits: No acute finding. Other: None. CT CERVICAL SPINE FINDINGS Alignment: Normal. Skull base and vertebrae: No acute fracture. No primary bone lesion or focal pathologic process. Soft tissues and spinal canal: No prevertebral fluid  or swelling. No visible canal hematoma. Disc levels: Mild disc height loss from C4-C5 through C6-C7. Moderate uncovertebral hypertrophy at C5-C6 and C6-C7. Severe facet arthropathy throughout the cervical spine with fusion of the right C4-C5 and left C3-C4 facet joints. Upper chest: Negative. Other: None. IMPRESSION: 1. New questionable mass lesion in the right parietal lobe with surrounding vasogenic edema. Recommend contrast-enhanced MRI of the brain for further evaluation. 2. No evidence of traumatic injury within the head or cervical spine. Electronically Signed   By: Titus Dubin M.D.   On: 08/02/2017 08:29   Mr Jodene Nam Head Wo Contrast  Result Date: 08/04/2017 CLINICAL DATA:  Recent fall. Seizure. Abnormal neuro exam. Abnormal CT of the head. EXAM: MRI HEAD WITHOUT CONTRAST MRA HEAD WITHOUT CONTRAST TECHNIQUE: Multiplanar, multiecho pulse sequences of the brain and surrounding structures were obtained without intravenous contrast. Angiographic images of the head were obtained using MRA technique without contrast. COMPARISON:  CT head without contrast 08/02/2017. FINDINGS: MRI HEAD FINDINGS Brain: A mass lesion in the medial right parietal lobe measures at least 2.9 x 2.0 x 3.7 cm. There is significant surrounding vasogenic edema. This effaces the sulci and has some mass effect on the right lateral ventricle. There is no hydrocephalus. Restricted diffusion is evident within the lesion and scattered throughout the adjacent white matter. Additional cortical restricted diffusion is present separate from the measured mass lesion. Abnormal T2 signal extends into the splenium of the corpus callosum. There is additional signal surrounding the atrium of the right lateral ventricle. No acute hemorrhage is present. Mild periventricular white matter changes are present otherwise. No significant extra-axial fluid collection is present. The internal auditory canals are within normal limits bilaterally. The brainstem is  normal. Remote lacunar infarcts are present in the inferior cerebellum bilaterally. No acute abnormality is present. Vascular: Flow is present in the major intracranial arteries. Skull and upper cervical spine: The skull base is within normal limits. The craniocervical junction is normal. Sinuses/Orbits: The paranasal sinuses and mastoid air cells are clear. MRA HEAD FINDINGS Atherosclerotic irregularity is present within the cavernous internal carotid arteries bilaterally. There is mild narrowing of approximately 50% at the ophthalmic segment on the right. ICA termini are intact. A moderate proximal right A1 segment stenosis is present. A moderate proximal left M1 segment stenosis is present. The anterior communicating artery is patent. There is asymmetric attenuation of the distal right M1 segment and right MCA branch vessels compared to the left. The vertebral arteries are codominant. PICA origins are not visualized. Vertebrobasilar junction is normal. Both posterior cerebral arteries originate from basilar tip. There is signal loss in the PCA branches bilaterally. IMPRESSION: 1. Infiltrative mass lesion in the medial right parietal lobe measures at least 2.9 x 2.0 x 3.7 cm. 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 MRI with contrast could be used for further evaluation if clinically indicated. T2 signal changes separate from the measured lesion likely represent infiltrative disease is well. 2. Mass effect from the primary lesion and surrounding vasogenic edema without hydrocephalus or downward herniation. 3. Mild stenosis involving the ophthalmic segment of  the right internal carotid artery. 4. Moderate right A1 and left M1 proximal stenoses. 5. Asymmetric attenuation of the distal right M1 segment and right MCA branch vessels compared to the left. Electronically Signed   By: San Morelle M.D.   On: 08/04/2017 08:57   Mr Brain Wo Contrast  Result Date: 08/04/2017 CLINICAL DATA:  Recent fall. Seizure. Abnormal neuro exam. Abnormal CT of the head. EXAM: MRI HEAD WITHOUT CONTRAST MRA HEAD WITHOUT CONTRAST TECHNIQUE: Multiplanar, multiecho pulse sequences of the brain and surrounding structures were obtained without intravenous contrast. Angiographic images of the head were obtained using MRA technique without contrast. COMPARISON:  CT head without contrast 08/02/2017. FINDINGS: MRI HEAD FINDINGS Brain: A mass lesion in the medial right parietal lobe measures at least 2.9 x 2.0 x 3.7 cm. There is significant surrounding vasogenic edema. This effaces the sulci and has some mass effect on the right lateral ventricle. There is no hydrocephalus. Restricted diffusion is evident within the lesion and scattered throughout the adjacent white matter. Additional cortical restricted diffusion is present separate from the measured mass lesion. Abnormal T2 signal extends into the splenium of the corpus callosum. There is additional signal surrounding the atrium of the right lateral ventricle. No acute hemorrhage is present. Mild periventricular white matter changes are present otherwise. No significant extra-axial fluid collection is present. The internal auditory canals are within normal limits bilaterally. The brainstem is normal. Remote lacunar infarcts are present in the inferior cerebellum bilaterally. No acute abnormality is present. Vascular: Flow is present in the major intracranial arteries. Skull and upper cervical spine: The skull base is within normal limits. The craniocervical junction is normal. Sinuses/Orbits: The paranasal sinuses and mastoid air cells are clear. MRA HEAD FINDINGS Atherosclerotic irregularity is present within the cavernous internal carotid arteries bilaterally. There is mild narrowing of approximately 50% at the ophthalmic segment on the right. ICA termini are intact. A  moderate proximal right A1 segment stenosis is present. A moderate proximal left M1 segment stenosis is present. The anterior communicating artery is patent. There is asymmetric attenuation of the distal right M1 segment and right MCA branch vessels compared to the left. The vertebral arteries are codominant. PICA origins are not visualized. Vertebrobasilar junction is normal. Both posterior cerebral arteries originate from basilar tip. There is signal loss in the PCA branches bilaterally. IMPRESSION: 1. Infiltrative mass lesion in the medial right parietal lobe measures at least 2.9 x 2.0 x 3.7 cm. 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 MRI with contrast could be used for further evaluation if clinically indicated. T2 signal changes separate from the measured lesion likely represent infiltrative disease is well. 2. Mass effect from the primary lesion and surrounding vasogenic edema without hydrocephalus or downward herniation. 3. Mild stenosis involving the ophthalmic segment of the right internal carotid artery. 4. Moderate right A1 and left M1 proximal stenoses. 5. Asymmetric attenuation of the distal right M1 segment and right MCA branch vessels compared to the left. Electronically Signed   By: San Morelle M.D.   On: 08/04/2017 08:57   Mr Brain W Contrast  Result Date: 08/04/2017 CLINICAL DATA:  Altered mental status for 2 days. Follow-up evaluation. EXAM: MRI HEAD WITH CONTRAST TECHNIQUE: Multiplanar, multiecho pulse sequences of the brain and surrounding structures were obtained with intravenous contrast. CONTRAST:  34mL MULTIHANCE GADOBENATE DIMEGLUMINE 529 MG/ML IV SOLN COMPARISON:  MRI of the head without  contrast Aug 04, 2017 at 0825 hours FINDINGS: Brain: 3 x 3.4 x 5.4 cm heterogeneously enhancing lobulated RIGHT mesial parietal lobe mass corresponding to previous  abnormality. There 2 adjacent nodules within the splenium of the corpus callosum measuring to 18 x 8 mm. In addition, 4 mm nodular enhancement RIGHT inferior frontal cortex. No abnormal extra-axial enhancement. Vascular: Not tailored for evaluation. Skull and upper cervical spine: No abnormal osseous enhancement. Sinuses/Orbits: No abnormal enhancement. Other: None. IMPRESSION: 1. Four suspicious foci of intraparenchymal enhancement, dominant lesion RIGHT parietal lobe 3 x 3.4 x 5.4 cm. 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 Electronically Signed   By: Elon Alas M.D.   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 EXAM: CT CHEST, ABDOMEN, AND PELVIS WITH CONTRAST TECHNIQUE: Multidetector CT imaging of the chest, abdomen and pelvis was performed following the standard protocol during bolus administration of intravenous contrast. CONTRAST:  75 cc ISOVUE-300 IOPAMIDOL (ISOVUE-300) INJECTION 61%, 62mL ISOVUE-300 IOPAMIDOL (ISOVUE-300) INJECTION 61% COMPARISON:  08/02/2017 head CT, chest radiograph 05/28/2017 FINDINGS: CT CHEST FINDINGS Cardiovascular: Retroesophageal course of the internal carotid arteries bilaterally. Conventional branch pattern of the great vessels without significant stenosis. Ectatic ascending thoracic aorta to 3.7 cm. No thoracic aortic dissection. Minimal coronary arteriosclerosis along left main and proximal LAD. No acute pulmonary embolus to the segmental level. Heart size is borderline enlarged without pericardial effusion. Mediastinum/Nodes: Thyroid goiter with coarse calcifications within the left lobe. Subcentimeter cystic nodules are identified the lower poles of both lobes. Patent midline trachea and mainstem bronchi. No mediastinal hilar lymphadenopathy. Lungs/Pleura: No dominant mass or pulmonary consolidation. Multilobar bilateral subpleural areas of  atelectasis and/or scarring. Mild peribronchial thickening within both lower lobes with subpleural areas of interstitial prominence and/or fibrosis in the lower lobes bilaterally. Small paraseptal foci of emphysema along the periphery of the right middle and both lower lobes. No effusion or pneumothorax. Musculoskeletal: Subchondral cystic change of both humeral heads and glenoid fossae. No aggressive osteolytic or blastic disease. Degenerative disc disease T7 through T10. CT ABDOMEN PELVIS FINDINGS Hepatobiliary: The liver enhances homogeneously. The gallbladder is physiologically distended without calculus. No biliary dilatation noted. Pancreas: No enhancing pancreatic mass or ductal dilatation. No inflammation. There appears to be mild side branch ductal ectasia of the pancreatic tail. Tiny side-branch intraductal papillary mucinous neoplasms are not entirely excluded, series 2/54. Spleen: Normal size.  No enhancing mass. Adrenals/Urinary Tract: Normal bilateral adrenal glands. 2.2 cm upper pole right renal cyst, simple in appearance with smaller subcentimeter hypodense lesions of both kidneys statistically consistent with cysts in the interpolar aspect of both kidneys and right lower pole. No nephrolithiasis, enhancing renal mass nor obstructive uropathy. The urinary bladder demonstrates no calculus or focal mural thickening. No focal mass. Stomach/Bowel: Small hiatal hernia. Decompressed stomach. Normal small bowel rotation is noted. No small-bowel dilatation or mass. No inflammation. The distal terminal ileum are unremarkable. No findings of acute appendicitis. Scattered colonic diverticulosis without acute diverticulitis. No annular constricting lesions. Moderate fecal retention within the descending and sigmoid colon. Vascular/Lymphatic: Mild-to-moderate aortic atherosclerosis. No aneurysm or adenopathy. Reproductive: Hysterectomy.  No adnexal mass. Other: No free air nor free fluid. Musculoskeletal:  Degenerative disc disease L4-5. No aggressive osteolytic or blastic disease. Multilevel degenerative facet arthropathy of the lumbar spine. Sclerosis about the pubic symphysis and both SI joints. Lumbosacral transitional vertebral anatomy with pseudoarticulation of L5 with S1 on the left. IMPRESSION: Chest  CT: 1. Multinodular goiter. 2. Coronary arteriosclerosis and aortic atherosclerosis. No aneurysm or acute pulmonary embolus. 3. No pulmonary lesions. 4. Minimal subpleural areas of bilateral atelectasis and/or fibrosis. CT AP: 1. Tiny cystic foci in the pancreatic tail may represent ectatic pancreatic duct side branches. Side branch IPMN might also be a possibility. 2. Bilateral renal cysts, the largest measuring 2.2 cm in the upper pole. The remainder are too small to further characterize in the interpolar aspect of both kidneys and right lower pole. 3. Scattered colonic diverticulosis without acute diverticulitis. Masslike abnormality identified. Electronically Signed   By: Ashley Royalty M.D.   On: 08/02/2017 19:16   Dg Knee Complete 4 Views Right  Result Date: 08/02/2017 CLINICAL DATA:  Fall in bathroom with right knee pain. EXAM: RIGHT KNEE - COMPLETE 4+ VIEW COMPARISON:  None. FINDINGS: Mild diffuse osteopenia. Mild tricompartmental osteoarthritic change worse over the lateral compartment. No evidence of acute fracture or dislocation. Small joint effusion is present. IMPRESSION: No acute fracture. Small joint effusion. Mild tricompartmental osteoarthritis. Electronically Signed   By: Marin Olp M.D.   On: 08/02/2017 08:45   Dg Knee Complete 4 Views Right  Result Date: 07/22/2017 CLINICAL DATA:  Fall in shower with right leg pain. Initial encounter. EXAM: RIGHT KNEE - COMPLETE 4+ VIEW COMPARISON:  02/19/2017 FINDINGS: Sizable joint effusion that was also seen previously. No definite fat component. No fracture or malalignment, sclerosis below the lateral tibial plateau is stable. Generalized  degenerative marginal spurring. Osteopenia. IMPRESSION: 1. Large joint effusion which was also seen on a 02/19/2017 study. No visible fracture. 2. Mild generalized degenerative spurring. Electronically Signed   By: Monte Fantasia M.D.   On: 07/22/2017 13:51   Dg Foot Complete Right  Result Date: 07/22/2017 CLINICAL DATA:  Fall in shower with right leg pain. Initial encounter. EXAM: RIGHT FOOT COMPLETE - 3+ VIEW COMPARISON:  None. FINDINGS: Soft tissue swelling at the ankle as described on dedicated imaging. Bones are osteopenic. No acute fracture or malalignment. Small heel spur. Hallux valgus with early toe crossing. Osteopenia. IMPRESSION: No acute osseous finding. Electronically Signed   By: Monte Fantasia M.D.   On: 07/22/2017 13:54   Dg Hip Unilat W Or Wo Pelvis 2-3 Views Left  Result Date: 08/02/2017 CLINICAL DATA:  Fall in bathroom with left-sided pain. EXAM: DG HIP (WITH OR WITHOUT PELVIS) 2-3V LEFT COMPARISON:  07/22/2017 FINDINGS: Mild diffuse osteopenia. Mild symmetric degenerative change of the hips. No evidence of acute fracture or dislocation. There are degenerative changes of the spine and sacroiliac joints. IMPRESSION: No acute fracture. Electronically Signed   By: Marin Olp M.D.   On: 08/02/2017 08:49   Dg Hip Unilat With Pelvis 2-3 Views Left  Result Date: 07/22/2017 CLINICAL DATA:  Fall from sitting. EXAM: DG HIP (WITH OR WITHOUT PELVIS) 2-3V LEFT COMPARISON:  None. FINDINGS: There is no evidence of hip fracture or dislocation. There is no evidence of arthropathy or other focal bone abnormality. Sclerotic change of the pubic symphysis. IMPRESSION: Normal left hip radiographs for age. Electronically Signed   By: Ulyses Jarred M.D.   On: 07/22/2017 13:44     Subjective: Feeling better.  Overall she feels weak.  No chest pain or shortness of breath.  Discharge Exam: Vitals:   08/11/2017 0504 08/14/2017 1457  BP: (!) 161/83 (!) 127/53  Pulse: 67 64  Resp: 18 19  Temp: 97.9 F  (36.6 C) 98.3 F (36.8 C)  SpO2: 100% 100%   Vitals:   08/04/17  1506 08/04/17 2131 08/15/2017 0504 07/21/2017 1457  BP: (!) 160/110 122/71 (!) 161/83 (!) 127/53  Pulse: 87 74 67 64  Resp: 19 19 18 19   Temp: 98.3 F (36.8 C) 98 F (36.7 C) 97.9 F (36.6 C) 98.3 F (36.8 C)  TempSrc:  Oral Oral Oral  SpO2: 99% 97% 100% 100%  Weight:      Height:        General: Pt is alert, awake, not in acute distress Cardiovascular: RRR, S1/S2 +, no rubs, no gallops Respiratory: CTA bilaterally, no wheezing, no rhonchi Abdominal: Soft, NT, ND, bowel sounds + Extremities: no edema, no cyanosis    The results of significant diagnostics from this hospitalization (including imaging, microbiology, ancillary and laboratory) are listed below for reference.     Microbiology: Recent Results (from the past 240 hour(s))  Urine culture     Status: Abnormal   Collection Time: 08/02/17  1:42 PM  Result Value Ref Range Status   Specimen Description   Final    URINE, CLEAN CATCH Performed at [facility], [address] [place], [place] [address]    Special Requests   Final    NONE Performed at [facility], [address] [place], [place] [address]    Culture >=100,000 COLONIES/mL ENTEROBACTER CLOACAE (A)  Final   Report Status 08/04/2017 FINAL  Final   Organism ID, Bacteria ENTEROBACTER CLOACAE (A)  Final      Susceptibility   Enterobacter cloacae - MIC*    CEFAZOLIN RESISTANT Resistant     CEFTRIAXONE <=1 SENSITIVE Sensitive     CIPROFLOXACIN <=0.25 SENSITIVE Sensitive     GENTAMICIN <=1 SENSITIVE Sensitive     IMIPENEM 1 SENSITIVE Sensitive     NITROFURANTOIN 32 SENSITIVE Sensitive     TRIMETH/SULFA <=20 SENSITIVE Sensitive     PIP/TAZO <=4 SENSITIVE Sensitive     * >=100,000 COLONIES/mL ENTEROBACTER CLOACAE     Labs: BNP (last 3 results) No results for input(s): BNP in the last 8760 hours. Basic Metabolic Panel: Recent Labs  Lab 08/02/17 1159 08/03/17 0649 08/04/17 0529  07/29/2017 0436  NA 138 140 140 139  K 4.3 4.4 4.5 4.5  CL 105 106 108 109  CO2 24 24 24 24   GLUCOSE 90 90 97 151*  BUN 31* 25* 20 24*  CREATININE 1.12* 0.97 0.91 0.93  CALCIUM 10.5* 10.2 9.6 9.8  MG  --  1.9 1.9 2.1   Liver Function Tests: Recent Labs  Lab 08/03/17 0649 08/04/17 0529 07/19/2017 0436  AST 18 16 18   ALT 9* 9* 9*  ALKPHOS 63 57 64  BILITOT 1.1 0.5 0.5  PROT 7.4 6.8 7.4  ALBUMIN 3.4* 3.1* 3.4*   No results for input(s): LIPASE, AMYLASE in the last 168 hours. No results for input(s): AMMONIA in the last 168 hours. CBC: Recent Labs  Lab 08/02/17 1159 08/03/17 0649 08/15/2017 0436  WBC 14.3* 9.6 8.2  NEUTROABS 10.6* 7.3 7.3  HGB 11.6* 11.3* 11.8*  HCT 35.7* 35.9* 37.6  MCV 96.7 97.6 97.7  PLT 287 267 289   Cardiac Enzymes: No results for input(s): CKTOTAL, CKMB, CKMBINDEX, TROPONINI in the last 168 hours. BNP: Invalid input(s): POCBNP CBG: No results for input(s): GLUCAP in the last 168 hours. D-Dimer No results for input(s): DDIMER in the last 72 hours. Hgb A1c No results for input(s): HGBA1C in the last 72 hours. Lipid Profile No results for input(s): CHOL, HDL, LDLCALC, TRIG, CHOLHDL, LDLDIRECT in the last 72 hours. Thyroid function studies  No results for input(s): TSH, T4TOTAL, T3FREE, THYROIDAB in the last 72 hours.  Invalid input(s): FREET3 Anemia work up No results for input(s): VITAMINB12, FOLATE, FERRITIN, TIBC, IRON, RETICCTPCT in the last 72 hours. Urinalysis    Component Value Date/Time   COLORURINE YELLOW 08/02/2017 1046   APPEARANCEUR HAZY (A) 08/02/2017 1046   LABSPEC 1.010 08/02/2017 1046   PHURINE 7.0 08/02/2017 1046   GLUCOSEU NEGATIVE 08/02/2017 1046   HGBUR NEGATIVE 08/02/2017 [place] 08/02/2017 1046   KETONESUR NEGATIVE 08/02/2017 1046   PROTEINUR NEGATIVE 08/02/2017 1046   UROBILINOGEN 0.2 09/14/2012 0945   NITRITE POSITIVE (A) 08/02/2017 1046   LEUKOCYTESUR SMALL (A) 08/02/2017 1046   Sepsis  Labs Invalid input(s): PROCALCITONIN,  WBC,  LACTICIDVEN Microbiology Recent Results (from the past 240 hour(s))  Urine culture     Status: Abnormal   Collection Time: 08/02/17  1:42 PM  Result Value Ref Range Status   Specimen Description   Final    URINE, CLEAN CATCH Performed at [facility], [address] [place], [place] [address]    Special Requests   Final    NONE Performed at [facility], [address], [place], [place] [address]    Culture >=100,000 COLONIES/mL ENTEROBACTER CLOACAE (A)  Final   Report Status 08/04/2017 FINAL  Final   Organism ID, Bacteria ENTEROBACTER CLOACAE (A)  Final      Susceptibility   Enterobacter cloacae - MIC*    CEFAZOLIN RESISTANT Resistant     CEFTRIAXONE <=1 SENSITIVE Sensitive     CIPROFLOXACIN <=0.25 SENSITIVE Sensitive     GENTAMICIN <=1 SENSITIVE Sensitive     IMIPENEM 1 SENSITIVE Sensitive     NITROFURANTOIN 32 SENSITIVE Sensitive     TRIMETH/SULFA <=20 SENSITIVE Sensitive     PIP/TAZO <=4 SENSITIVE Sensitive     * >=100,000 COLONIES/mL ENTEROBACTER CLOACAE     Time coordinating discharge: 68mins  SIGNED:   Kathie Dike, MD  [facility] 07/28/2017, 4:03 PM Pager   If 7PM-7AM, please contact night-coverage [email or web address] Password [email or web address]

## 2017-08-06 ENCOUNTER — Non-Acute Institutional Stay (SKILLED_NURSING_FACILITY): Payer: Medicare Other | Admitting: Internal Medicine

## 2017-08-06 ENCOUNTER — Encounter: Payer: Self-pay | Admitting: Internal Medicine

## 2017-08-06 DIAGNOSIS — R569 Unspecified convulsions: Secondary | ICD-10-CM

## 2017-08-06 DIAGNOSIS — N10 Acute tubulo-interstitial nephritis: Secondary | ICD-10-CM

## 2017-08-06 DIAGNOSIS — E038 Other specified hypothyroidism: Secondary | ICD-10-CM

## 2017-08-06 DIAGNOSIS — G939 Disorder of brain, unspecified: Secondary | ICD-10-CM | POA: Diagnosis not present

## 2017-08-06 DIAGNOSIS — I10 Essential (primary) hypertension: Secondary | ICD-10-CM | POA: Diagnosis not present

## 2017-08-06 NOTE — Progress Notes (Signed)
Location:   [facility] Room Number: 155/P Place of Service:  SNF (31) Provider:  Ewell Poe, MD  Patient Care Team: Kathyrn Drown, MD as PCP - General (Family Medicine)  Extended Emergency Contact Information Primary Emergency Contact: Barnwell,[name] Address: [address]          [place], [place] [address] [name] Phone: [phone] Mobile Phone: [phone] Relation: Spouse Secondary Emergency Contact: cliborne,kathy Home Phone: [phone] Relation: Sister  Code Status:  Full Code Goals of care: Advanced Directive information Advanced Directives 08/06/2017  Does Patient Have a Medical Advance Directive? Yes  Type of Advance Directive (No Data)  Does patient want to make changes to medical advance directive? No - Patient declined  Copy of [place] in Chart? -  Would patient like information on creating a medical advance directive? No - Patient declined  Pre-existing out of facility DNR order (yellow form or pink MOST form) -   Chief complaint- acute visit status post hospitalization for recurrent falls-with diagnosis of right parietal brain lesion complicated with hypercalcemia    HPI:  Pt is a 84 y.o. female seen today for a hospitalization secondary to increased falling and increased debility.  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  She was seen by oncology as well  as films reviewed by neurosurgery.  CT scan of her chest abdomen and pelvis did not show any clear primary source.  Was thought she would likely will need further work-up with a biopsy and she will be seeing neurosurgery later this week. She is on Decadron for mild edema.  Apparently Dr.  Denman George will determine possible further biopsies or possible surgery.  Patient apparently did have a partial seizure convulsion on the left side-this was thought related to the brain lesion she  was started on Keppra with effectiveness.  In regards to hypercalcemia this was thought possibly due to malignancy she does have a history of--Of hyperparathyroidism as well.  This was treated with IV fluids and improved she also got a dose of Zometa.  She is also treated for Enterobacter UTI she is completing a course of Levaquin.  She also had acute renal insufficiency and this was thought secondary to diet hydration and hypercalcemia improved with IV fluids.  She also is a type II diabetic which is diet controlled blood sugars were stable in the hospital.  She is here for strengthening with physical therapy again she will have neurology follow-up as well.  Currently she has no acute complaints she says she has not had a bowel movement for several days but is not really complaining of abdominal discomfort nausea vomiting or pain.        Past Medical History:  Diagnosis Date  . Arthritis    Knees  . Chronic back pain   . Chronic pain of right knee   . Diabetes mellitus   . Gait abnormality   . Hypertension   . Macular degeneration   . Thyroid disease    Past Surgical History:  Procedure Laterality Date  . ABDOMINAL HYSTERECTOMY    . COLONOSCOPY    . SHOULDER SURGERY      Allergies  Allergen Reactions  . Aspirin   . Ibuprofen     Nausea or HTN  . Penicillins Rash    Has patient had a PCN reaction causing immediate rash, facial/tongue/throat swelling, SOB or lightheadedness with hypotension: unknown Has patient had  a PCN reaction causing severe rash involving mucus membranes or skin necrosis: unknown Has patient had a PCN reaction that required hospitalization: no Has patient had a PCN reaction occurring within the last 10 years: No If all of the above answers are "NO", then may proceed with Cephalosporin use.     Outpatient Encounter Medications as of 08/06/2017  Medication Sig  . acetaminophen (TYLENOL) 650 MG CR tablet Take 650 mg by mouth every 8 (eight)  hours as needed for pain.   Marland Kitchen amLODipine (NORVASC) 2.5 MG tablet TAKE 1 TABLET BY MOUTH EVERY DAY  . beta carotene w/minerals (OCUVITE) tablet Take 1 tablet by mouth daily. Reported on 07/24/2015  . carvedilol (COREG) 12.5 MG tablet Take 1/2 tablet by mouth twice daily.  . [employer] D3 5000 units capsule TAKE 1 CAPSULE (5,000 UNITS TOTAL) BY MOUTH DAILY.  Marland Kitchen dexamethasone (DECADRON) 4 MG tablet Take 1 tablet (4 mg total) by mouth 2 (two) times daily with a meal.  . levETIRAcetam (KEPPRA) 500 MG tablet Take 1 tablet (500 mg total) by mouth 2 (two) times daily.  Marland Kitchen levofloxacin (LEVAQUIN) 500 MG tablet Take 1 tablet (500 mg total) by mouth daily for 4 days.  Marland Kitchen levothyroxine (SYNTHROID, LEVOTHROID) 150 MCG tablet Take 2 tablets on mon, wed, and Friday. Take one and a half tablets on tues, thurs, sat, and sun   No facility-administered encounter medications on file as of 08/06/2017.        Review of Systems   General she is not complaining of any fever or chills.  Skin is not complain of rashes or itching.      Head ears eyes nose mouth and throat does have a history of limited vision  Respiratory is not complaining of shortness of breath or cough.  Cardiac denies chest pain has some mild right lower extremity edema unsure of chronicity.  GI is not complaining of abdominal pain nausea vomiting diarrhea--says has not had a bowel movement several days however  GU is not complaining of dysuria.  Musculoskeletal does have a history of falls currently is not complaining of joint pain.  Neurologic does have some weakness does not complain of headache or dizziness or syncope at this point has been started on Keppra secondary to suspicion of partial seizures.  Psych does not complain of being overtly anxious or depressed  Immunization History  Administered Date(s) Administered  . Influenza Split 11/30/2012  . Influenza,inj,Quad PF,6+ Mos 11/30/2014, 01/09/2017  . Influenza-Unspecified  01/17/2012, 12/21/2013  . Pneumococcal Conjugate-13 10/08/2013  . Pneumococcal Polysaccharide-23 10/24/2015  . Td 03/20/2011   Pertinent  Health Maintenance Due  Topic Date Due  . FOOT EXAM  09/06/2017 (Originally 03/12/2017)  . OPHTHALMOLOGY EXAM  09/06/2017 (Originally 08/01/2017)  . URINE MICROALBUMIN  09/06/2017 (Originally 09/07/2015)  . INFLUENZA VACCINE  10/16/2017  . HEMOGLOBIN A1C  12/13/2017  . DEXA SCAN  Completed  . PNA vac Low Risk Adult  Completed   Fall Risk  06/24/2017 09/30/2016 06/05/2015 11/30/2014 07/06/2013  Falls in the past year? No No No No No  Risk for fall due to : Impaired balance/gait;Impaired mobility;History of fall(s);Medication side effect - - - -   Functional Status Survey:    Vitals:   08/06/17 1102  BP: (!) 144/70  Pulse: 75  Resp: 18  Temp: 98.2 F (36.8 C)  TempSrc: Oral   of note manual blood pressure was 140/80  Physical Exam   In general this is a pleasant elderly female in no distress  sitting comfortably in her wheelchair she appears younger than her stated age.  Her skin is warm and dry.  Eyes sclera conjunctive are clear visual acuity appears to be at baseline she does have some history limited vision but can make out what I am wearing.  Sclera and conjunctive are clear  Oropharynx is clear mucous membranes moist.  Chest is clear to auscultation there is no labored breathing.  Heart is regular rate and rhythm without murmur gallop or rub she has some increased right lower extremity edema which is fairly mild but increased compared to the left leg pedal pulses are intact bilaterally.  Abdomen is soft nontender with positive bowel sounds.  Musculoskeletal appears able to move all extremities x4 cannot really appreciate true lateralizing findings grip strength is strong bilaterally.  Neurologic appears to be grossly intact her speech is clear.  Psych she is alert and oriented pleasant and appropriate.  Labs.  May  21----2019  - WBC 8.2 hemoglobin 11.8 platelets, Magnesium 2.1.   Sodium 139 potassium 4.5 BUN 24 creatinine 0.93.  ALT 9 otherwise liver function tests within normal limits.  Aug 02, 2017.  TSH-3.14.  June 12, 2017.  Hemoglobin A1c 5.3.  Assessment and plan.  1.  Right parietal brain lesion-will have follow-up with neurosurgery later this week-for consideration of biopsy possible surgery.  Regards to edema continues on Decadron will await neurology input clinically appears to be stable.  2.  History of seizures thought secondary to the brain lesion she is on Keppra at this point will monitor.  3.  History of hypercalcemia this was thought secondary to malignancy also has a history of hyperparathyroidism- she did receive IV fluids in the hospital and Zometa and this did normalize with a calcium of 9.8- recommendation to recheck this in approximately a week.  4.-  History of type 2 diabetes thought to be controlled we will check CBGs to monitor especially since she is on steroids-hemoglobin A1c in March was 5.3. Blood sugar this morning was 125  5.  History of hypertension at this point will monitor blood pressure manually today was 140/80 she is on low-dose Norvasc as well as low-dose Coreg.  6.  History of Enterobacter UTI she is finishing a course of Levaquin and this appears to be stable.  7.  History of acute kidney injury this was thought secondary to the elevated calcium improved with IV fluids and will update a BMP.  8.  History of possible constipation will update an x-ray to rule out anything acute although I suspect this is probably mild constipation will await x-ray results.  9.  History of right leg edema this may be chronic but will order a venous Doppler to rule out any acute etiology.  10.  History of hypothyroidism she is on Synthroid TSH during hospitalization was within normal range at 3.14.   Again will check an abdominal x-ray-also a venous Doppler of  the right leg- will need updated labs within the next several days to keep an eye on her calcium and renal function.  CPT- 99310-of note greater than 45 minutes spent assessing patient-reviewing her chart labs- and coordinating and formulating a plan of care for numerous diagnoses- of note greater than 50% of time spent coordinating plan of care       Labs reviewed: Recent Labs    10/07/16 1119  08/03/17 0649 08/04/17 0529 08/15/2017 0436  NA  --    < > 140 140 139  K  --    < >  4.4 4.5 4.5  CL  --    < > 106 108 109  CO2  --    < > 24 24 24   GLUCOSE  --    < > 90 97 151*  BUN  --    < > 25* 20 24*  CREATININE  --    < > 0.97 0.91 0.93  CALCIUM 10.2   < > 10.2 9.6 9.8  MG 1.9  --  1.9 1.9 2.1  PHOS 3.0  --   --   --   --    < > = values in this interval not displayed.   Recent Labs    08/03/17 0649 08/04/17 0529 08/09/2017 0436  AST 18 16 18   ALT 9* 9* 9*  ALKPHOS 63 57 64  BILITOT 1.1 0.5 0.5  PROT 7.4 6.8 7.4  ALBUMIN 3.4* 3.1* 3.4*   Recent Labs    08/02/17 1159 08/03/17 0649 08/04/2017 0436  WBC 14.3* 9.6 8.2  NEUTROABS 10.6* 7.3 7.3  HGB 11.6* 11.3* 11.8*  HCT 35.7* 35.9* 37.6  MCV 96.7 97.6 97.7  PLT 287 267 289   Lab Results  Component Value Date   TSH 3.140 08/02/2017   Lab Results  Component Value Date   HGBA1C 5.3 06/12/2017   Lab Results  Component Value Date   CHOL 147 05/09/2016   HDL 36 (L) 05/09/2016   LDLCALC 92 05/09/2016   TRIG 95 05/09/2016   CHOLHDL 4.1 05/09/2016    Significant Diagnostic Results in last 30 days:  No results found.  Assessment/Plan There are no diagnoses linked to this encounter.   Family/ staff Communication:   Labs/tests ordered:

## 2017-08-07 ENCOUNTER — Non-Acute Institutional Stay (SKILLED_NURSING_FACILITY): Payer: Medicare Other | Admitting: Internal Medicine

## 2017-08-07 ENCOUNTER — Encounter (HOSPITAL_COMMUNITY)
Admission: RE | Admit: 2017-08-07 | Discharge: 2017-08-07 | Disposition: A | Discharge status: Home / Self Care | Payer: Medicare Other | Source: Skilled Nursing Facility | Attending: Internal Medicine | Admitting: Internal Medicine

## 2017-08-07 ENCOUNTER — Encounter: Payer: Self-pay | Admitting: Internal Medicine

## 2017-08-07 DIAGNOSIS — E038 Other specified hypothyroidism: Secondary | ICD-10-CM | POA: Diagnosis not present

## 2017-08-07 DIAGNOSIS — I10 Essential (primary) hypertension: Secondary | ICD-10-CM

## 2017-08-07 DIAGNOSIS — C713 Malignant neoplasm of parietal lobe: Secondary | ICD-10-CM | POA: Insufficient documentation

## 2017-08-07 DIAGNOSIS — E030 Congenital hypothyroidism with diffuse goiter: Secondary | ICD-10-CM | POA: Insufficient documentation

## 2017-08-07 DIAGNOSIS — G939 Disorder of brain, unspecified: Secondary | ICD-10-CM | POA: Diagnosis not present

## 2017-08-07 DIAGNOSIS — N390 Urinary tract infection, site not specified: Secondary | ICD-10-CM | POA: Diagnosis not present

## 2017-08-07 DIAGNOSIS — E213 Hyperparathyroidism, unspecified: Secondary | ICD-10-CM

## 2017-08-07 DIAGNOSIS — E119 Type 2 diabetes mellitus without complications: Secondary | ICD-10-CM | POA: Insufficient documentation

## 2017-08-07 LAB — CBC
HCT: 36.1 % (ref 36.0–46.0)
Hemoglobin: 11.3 g/dL — ABNORMAL LOW (ref 12.0–15.0)
MCH: 30.4 pg (ref 26.0–34.0)
MCHC: 31.3 g/dL (ref 30.0–36.0)
MCV: 97 fL (ref 78.0–100.0)
Platelets: 312 10*3/uL (ref 150–400)
RBC: 3.72 MIL/uL — ABNORMAL LOW (ref 3.87–5.11)
RDW: 14.3 % (ref 11.5–15.5)
WBC: 16.6 10*3/uL — ABNORMAL HIGH (ref 4.0–10.5)

## 2017-08-07 LAB — BASIC METABOLIC PANEL WITH GFR
Anion gap: 7 (ref 5–15)
BUN: 34 mg/dL — ABNORMAL HIGH (ref 6–20)
CO2: 23 mmol/L (ref 22–32)
Calcium: 9.4 mg/dL (ref 8.9–10.3)
Chloride: 110 mmol/L (ref 101–111)
Creatinine, Ser: 0.93 mg/dL (ref 0.44–1.00)
GFR calc Af Amer: >60 mL/min
GFR calc non Af Amer: 56 mL/min — ABNORMAL LOW
Glucose, Bld: 123 mg/dL — ABNORMAL HIGH (ref 65–99)
Potassium: 4.4 mmol/L (ref 3.5–5.1)
Sodium: 140 mmol/L (ref 135–145)

## 2017-08-07 NOTE — Progress Notes (Addendum)
Provider: Veleta Miners  Location:   [facility] Room Number: 155/P Place of Service:  SNF (31)  PCP: Kathyrn Drown, MD Patient Care Team: Kathyrn Drown, MD as PCP - General (Family Medicine)  Extended Emergency Contact Information Primary Emergency Contact: Ederer,[name] Address: [address]          [place], [place] [address] [name] of [place] Phone: [phone] Mobile Phone: [phone] Relation: Spouse Secondary Emergency Contact: cliborne,kathy Home Phone: [phone] Relation: Sister  Code Status: Full Code Goals of Care: Advanced Directive information Advanced Directives 08/07/2017  Does Patient Have a Medical Advance Directive? Yes  Type of Advance Directive (No Data)  Does patient want to make changes to medical advance directive? No - Patient declined  Copy of [place] in Chart? -  Would patient like information on creating a medical advance directive? No - Patient declined  Pre-existing out of facility DNR order (yellow form or pink MOST form) -      Chief Complaint  Patient presents with  . New Admit To SNF    Patient being seen for New Admission Visit    HPI: Patient is a 84 y.o. female seen today for admission to SNF for therapy. Patient has h/o Hypertension, Hypercalcemia with Hyperparathyroidism followed by Dr Dorris Fetch, CKD disease Stage 3 , Hypothyroid, And Macular Degeneration.  Patient presented to the ED for Recurrent falls at home. She says she has been not walking for past 1 year and has been dependent on her Husband. She was having more Frequent falls recently and husband was unable to take care of her. In the hospital CT scan of head  And MRI showed Brain Lesion in parietal area multifocal concerning for Metastatic Disease.  She was seen by oncology and plan for outpatient work Up.  She also had CT scan of abdomen and chest which was negative for any malignancy. She also had UTI and is on  antibiotics Her calcium was elevated she was treated with 1 dose of Zometa She also had non voluntary movements of Left Leg thought to be due to seizures and was started on Keppra. Patient did not have any complaints today.  She did have some anxiety yesterday night.  denies any headache, nausea or vomiting. She lives with her husband who is  unable to take care of her due to her worsening Weakness.  She plans to get more stronger and go home with him   Past Medical History:  Diagnosis Date  . Arthritis    Knees  . Chronic back pain   . Chronic pain of right knee   . Diabetes mellitus   . Gait abnormality   . Hypertension   . Macular degeneration   . Thyroid disease    Past Surgical History:  Procedure Laterality Date  . ABDOMINAL HYSTERECTOMY    . COLONOSCOPY    . SHOULDER SURGERY      reports that she has never smoked. She has never used smokeless tobacco. She reports that she does not drink alcohol or use drugs. Social History   Socioeconomic History  . Marital status: Married    Spouse name: Not on file  . Number of children: Not on file  . Years of education: Not on file  . Highest education level: Not on file  Occupational History  . Not on file  Social Needs  . Financial resource strain: Not on file  . Food insecurity:    Worry: Not on  file    Inability: Not on file  . Transportation needs:    Medical: Not on file    Non-medical: Not on file  Tobacco Use  . Smoking status: Never Smoker  . Smokeless tobacco: Never Used  Substance and Sexual Activity  . Alcohol use: No  . Drug use: No  . Sexual activity: Not on file  Lifestyle  . Physical activity:    Days per week: Not on file    Minutes per session: Not on file  . Stress: Not on file  Relationships  . Social connections:    Talks on phone: Not on file    Gets together: Not on file    Attends religious service: Not on file    Active member of club or organization: Not on file    Attends meetings of  clubs or organizations: Not on file    Relationship status: Not on file  . Intimate partner violence:    Fear of current or ex partner: Not on file    Emotionally abused: Not on file    Physically abused: Not on file    Forced sexual activity: Not on file  Other Topics Concern  . Not on file  Social History Narrative  . Not on file    Functional Status Survey:    Family History  Problem Relation Age of Onset  . Heart disease Unknown   . Arthritis Unknown   . Cancer Unknown   . Diabetes Unknown   . Kidney disease Unknown     Health Maintenance  Topic Date Due  . FOOT EXAM  09/06/2017 (Originally 03/12/2017)  . OPHTHALMOLOGY EXAM  09/06/2017 (Originally 08/01/2017)  . URINE MICROALBUMIN  09/06/2017 (Originally 09/07/2015)  . INFLUENZA VACCINE  10/16/2017  . HEMOGLOBIN A1C  12/13/2017  . TETANUS/TDAP  03/19/2021  . DEXA SCAN  Completed  . PNA vac Low Risk Adult  Completed    Allergies  Allergen Reactions  . Aspirin   . Ibuprofen     Nausea or HTN  . Penicillins Rash    Has patient had a PCN reaction causing immediate rash, facial/tongue/throat swelling, SOB or lightheadedness with hypotension: unknown Has patient had a PCN reaction causing severe rash involving mucus membranes or skin necrosis: unknown Has patient had a PCN reaction that required hospitalization: no Has patient had a PCN reaction occurring within the last 10 years: No If all of the above answers are "NO", then may proceed with Cephalosporin use.     Outpatient Encounter Medications as of 08/07/2017  Medication Sig  . acetaminophen (TYLENOL) 650 MG CR tablet Take 650 mg by mouth every 8 (eight) hours as needed for pain.   Marland Kitchen amLODipine (NORVASC) 2.5 MG tablet TAKE 1 TABLET BY MOUTH EVERY DAY  . beta carotene w/minerals (OCUVITE) tablet Take 1 tablet by mouth daily. Reported on 07/24/2015  . carvedilol (COREG) 12.5 MG tablet Take 1/2 tablet by mouth twice daily.  . [employer] D3 5000 units capsule TAKE 1  CAPSULE (5,000 UNITS TOTAL) BY MOUTH DAILY.  Marland Kitchen dexamethasone (DECADRON) 4 MG tablet Take 1 tablet (4 mg total) by mouth 2 (two) times daily with a meal.  . levETIRAcetam (KEPPRA) 500 MG tablet Take 1 tablet (500 mg total) by mouth 2 (two) times daily.  Marland Kitchen levofloxacin (LEVAQUIN) 500 MG tablet Take 1 tablet (500 mg total) by mouth daily for 4 days.  Marland Kitchen levothyroxine (SYNTHROID, LEVOTHROID) 150 MCG tablet Take 2 tablets on mon, wed, and Friday. Take one and  a half tablets on tues, thurs, sat, and sun   No facility-administered encounter medications on file as of 08/07/2017.      Review of Systems  Review of Systems  Constitutional: Negative for activity change, appetite change, chills, diaphoresis, fatigue and fever.  HENT: Negative for mouth sores, postnasal drip, rhinorrhea, sinus pain and sore throat.   Respiratory: Negative for apnea, cough, chest tightness, shortness of breath and wheezing.   Cardiovascular: Negative for chest pain, palpitations and leg swelling.  Gastrointestinal: Negative for abdominal distention, abdominal pain, constipation, diarrhea, nausea and vomiting.  Genitourinary: Negative for dysuria and frequency.  Musculoskeletal: Negative for arthralgias, joint swelling and myalgias.  Skin: Negative for rash.  Neurological: Negative for dizziness, syncope, weakness, light-headedness and numbness.  Psychiatric/Behavioral: Negative for behavioral problems, Positive for confusion and sleep disturbance.     Vitals:   08/07/17 1020  BP: (!) 156/88  Pulse: 64  Resp: 18  Temp: 98.6 F (37 C)  TempSrc: Oral   There is no height or weight on file to calculate BMI. Physical Exam  Constitutional: She is oriented to person, place, and time. She appears well-developed and well-nourished.  HENT:  Head: Normocephalic.  Mouth/Throat: Oropharynx is clear and moist.  Eyes: Pupils are equal, round, and reactive to light.  Neck: Neck supple.  Cardiovascular: Normal rate and  regular rhythm.  No murmur heard. Pulmonary/Chest: Effort normal and breath sounds normal. No stridor. No respiratory distress. She has no wheezes.  Abdominal: Soft. Bowel sounds are normal. She exhibits no distension. There is no tenderness. There is no guarding.  Musculoskeletal: She exhibits no edema.  Neurological: She is alert and oriented to person, place, and time.  No Focal deficits  Skin: Skin is warm and dry.  Psychiatric: She has a normal mood and affect. Her behavior is normal. Thought content normal.    Labs reviewed: Basic Metabolic Panel: Recent Labs    10/07/16 1119  08/03/17 0649 08/04/17 0529 07/23/2017 0436 08/07/17 0744  NA  --    < > 140 140 139 140  K  --    < > 4.4 4.5 4.5 4.4  CL  --    < > 106 108 109 110  CO2  --    < > 24 24 24 23   GLUCOSE  --    < > 90 97 151* 123*  BUN  --    < > 25* 20 24* 34*  CREATININE  --    < > 0.97 0.91 0.93 0.93  CALCIUM 10.2   < > 10.2 9.6 9.8 9.4  MG 1.9  --  1.9 1.9 2.1  --   PHOS 3.0  --   --   --   --   --    < > = values in this interval not displayed.   Liver Function Tests: Recent Labs    08/03/17 0649 08/04/17 0529 08/12/2017 0436  AST 18 16 18   ALT 9* 9* 9*  ALKPHOS 63 57 64  BILITOT 1.1 0.5 0.5  PROT 7.4 6.8 7.4  ALBUMIN 3.4* 3.1* 3.4*   No results for input(s): LIPASE, AMYLASE in the last 8760 hours. No results for input(s): AMMONIA in the last 8760 hours. CBC: Recent Labs    08/02/17 1159 08/03/17 0649 08/09/2017 0436 08/07/17 0744  WBC 14.3* 9.6 8.2 16.6*  NEUTROABS 10.6* 7.3 7.3  --   HGB 11.6* 11.3* 11.8* 11.3*  HCT 35.7* 35.9* 37.6 36.1  MCV 96.7 97.6 97.7 97.0  PLT  287 267 289 312   Cardiac Enzymes: No results for input(s): CKTOTAL, CKMB, CKMBINDEX, TROPONINI in the last 8760 hours. BNP: Invalid input(s): POCBNP Lab Results  Component Value Date   HGBA1C 5.3 06/12/2017   Lab Results  Component Value Date   TSH 3.140 08/02/2017   No results found for: VITAMINB12 No results found  for: FOLATE No results found for: IRON, TIBC, FERRITIN  Imaging and Procedures obtained prior to SNF admission: No results found.  Assessment/Plan  Lesion of parietal lobe of brain Concerning for metastatic disease though her.  CT scan of Chest and Abdomen has been negative so far. Patient has appointment with neurosurgery tomorrow Also On decadron BID Partial seizures involving left side of body Patient was started on Keppra for involuntary movement of left lower leg possible seizures.  She says she has not had any other problems Hypercalcemia Patient has a history of hypercalcemia. She was given 1 dose of Zometa Her calcium level is normal today Will continue to follow  Urinary tract infection  On Levaquin for 4 more days  Hypothyroidism On Levothyroxine TSH normal in hospital.  Hyperparathyroidism With Hypercalcemia Follows with Dr Dorris Fetch  Essential hypertension BP controlled on Coreg and Amlodipine.  Recurrent falls Patient has been nonambulatory. Will restart therapy.  She is hoping she can walk with a walker Constipation Had Abdominal Xray in facility which showed Moderate constipation Will start on Miralax  Diabetes Her A1C was 5.3 in 03/19 Not on Any hypoglycemic Will Continue Accu Check as she is on Decadaron Anxiety Patient had anxiety attack yesterday evening  We will start her on low-dose of Ativan as needed Disposition Patient hopeful she can go home with her husband Family/ staff Communication:   Labs/tests ordered:  Total time spent in this patient care encounter was 45_ minutes; greater than 50% of the visit spent counseling patient, reviewing records , Labs and coordinating care for problems addressed at this encounter.

## 2017-08-08 DIAGNOSIS — I10 Essential (primary) hypertension: Secondary | ICD-10-CM | POA: Diagnosis not present

## 2017-08-08 DIAGNOSIS — C713 Malignant neoplasm of parietal lobe: Secondary | ICD-10-CM | POA: Diagnosis not present

## 2017-08-08 DIAGNOSIS — Z6830 Body mass index (BMI) 30.0-30.9, adult: Secondary | ICD-10-CM | POA: Diagnosis not present

## 2017-08-12 ENCOUNTER — Telehealth: Payer: Self-pay | Admitting: Orthopedic Surgery

## 2017-08-12 ENCOUNTER — Other Ambulatory Visit: Payer: Self-pay

## 2017-08-12 MED ORDER — LORAZEPAM 0.5 MG PO TABS: 0.2500 mg | ORAL_TABLET | Freq: Every evening | ORAL | 0 refills | Status: DC | PRN

## 2017-08-12 NOTE — Telephone Encounter (Signed)
I received a call from [place] at [facility].  She states the patient and her family requested that this appointment be canceled.  They do not feel that she needs to come in.

## 2017-08-12 NOTE — Telephone Encounter (Signed)
RX Fax for [facility]@ [phone]  

## 2017-08-13 ENCOUNTER — Non-Acute Institutional Stay (SKILLED_NURSING_FACILITY): Payer: Medicare Other | Admitting: Internal Medicine

## 2017-08-13 ENCOUNTER — Encounter: Payer: Self-pay | Admitting: Internal Medicine

## 2017-08-13 DIAGNOSIS — E118 Type 2 diabetes mellitus with unspecified complications: Secondary | ICD-10-CM | POA: Diagnosis not present

## 2017-08-13 DIAGNOSIS — E1165 Type 2 diabetes mellitus with hyperglycemia: Secondary | ICD-10-CM

## 2017-08-13 DIAGNOSIS — Z5189 Encounter for other specified aftercare: Secondary | ICD-10-CM

## 2017-08-13 DIAGNOSIS — G939 Disorder of brain, unspecified: Secondary | ICD-10-CM | POA: Diagnosis not present

## 2017-08-13 DIAGNOSIS — R569 Unspecified convulsions: Secondary | ICD-10-CM | POA: Diagnosis not present

## 2017-08-13 DIAGNOSIS — IMO0002 Reserved for concepts with insufficient information to code with codable children: Secondary | ICD-10-CM

## 2017-08-13 NOTE — Progress Notes (Signed)
Location:   [facility] Room Number: 155/P Place of Service:  SNF (31) Provider:  Ewell Poe, MD  Patient Care Team: Cynthia Drown, MD as PCP - General (Family Medicine)  Extended Emergency Contact Information Primary Emergency Contact: Cynthia Marks,[name] Address: [address]          [place], [place] [address] Cynthia Marks of [place] Phone: [phone] Mobile Phone: [phone] Relation: Spouse Secondary Emergency Contact: Cynthia Marks,Cynthia Marks Home Phone: [phone] Relation: Sister  Code Status:  Full Code Goals of care: Advanced Directive information Advanced Directives 08/13/2017  Does Patient Have a Medical Advance Directive? Yes  Type of Advance Directive (No Data)  Does patient want to make changes to medical advance directive? No - Patient declined  Copy of [place] in Chart? -  Would patient like information on creating a medical advance directive? No - Patient declined  Pre-existing out of facility DNR order (yellow form or pink MOST form) -     Chief Complaint  Patient presents with  . Acute Visit    Patient being seen per family request for wondering at night and late night phone calls to them, they are  request if a adjustment of  medication might help     HPI:  Pt is a 84 y.o. female seen today for an acute visit for possible medication adjustment. Patient is here for therapy he has a history of anxiety as well as hypertension hypercalcemia with hyperparathyroidism as well as chronic kidney disease hypothyroidism and macular degeneration.  She initially presented to the emergency department for recurrent falls ambulating.  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  She was seen by oncology with plan for outpatient work-up.  CT scan of abdomen and chest was negative for any malignancy.   She was also treated for UTI with antibiotics.  She is on  Keppra since there was a suspicion she may be having seizures with nonvoluntary movements of the left leg.  She also received a dose of Zometa secondary to elevated  calcium Apparently patient does have some anxiety more so at night which is making it difficult to sleep.  SHE does have an order for Ativan 0.25 mg at night she has tolerated this well apparently but says it is not totally effective   Regards to her other issues she does continue on Decadron with her history of the brain lesion and will have oncology follow-up there have been no seizures to my knowledge she is on Keppra.  She is finishing a course of Levaquin for the UTI.  She is also a type II diabetic on no medications diet-controlled CBGs have been largely in the mid 100s.  Again her main complaint today is insomnia-anxiety which makes it difficult to sleep     Past Medical History:  Diagnosis Date  . Arthritis    Knees  . Chronic back pain   . Chronic pain of right knee   . Diabetes mellitus   . Gait abnormality   . Hypertension   . Macular degeneration   . Thyroid disease    Past Surgical History:  Procedure Laterality Date  . ABDOMINAL HYSTERECTOMY    . COLONOSCOPY    . SHOULDER SURGERY      Allergies  Allergen Reactions  . Aspirin   . Ibuprofen     Nausea or HTN  . Penicillins Rash    Has patient had a PCN  reaction causing immediate rash, facial/tongue/throat swelling, SOB or lightheadedness with hypotension: unknown Has patient had a PCN reaction causing severe rash involving mucus membranes or skin necrosis: unknown Has patient had a PCN reaction that required hospitalization: no Has patient had a PCN reaction occurring within the last 10 years: No If all of the above answers are "NO", then may proceed with Cephalosporin use.     Outpatient Encounter Medications as of 08/13/2017  Medication Sig  . acetaminophen (TYLENOL) 650 MG CR tablet Take 650 mg by mouth every 8 (eight) hours as needed  for pain.   Marland Kitchen amLODipine (NORVASC) 2.5 MG tablet TAKE 1 TABLET BY MOUTH EVERY DAY  . carvedilol (COREG) 6.25 MG tablet Take 6.25 mg by mouth 2 (two) times daily with a meal.  . Cholecalciferol 5000 units capsule Take 5,000 Units by mouth daily.  Marland Kitchen dexamethasone (DECADRON) 4 MG tablet Take 1 tablet (4 mg total) by mouth 2 (two) times daily with a meal.  . levETIRAcetam (KEPPRA) 500 MG tablet Take 1 tablet (500 mg total) by mouth 2 (two) times daily.  Marland Kitchen levothyroxine (SYNTHROID, LEVOTHROID) 150 MCG tablet Take 2 tablets on mon, wed, and Friday. Take one and a half tablets on tues, thurs, sat, and sun  . LORazepam (ATIVAN) 0.5 MG tablet Take 0.5 tablets (0.25 mg total) by mouth at bedtime as needed for anxiety.  . Multiple Vitamins-Minerals (MULTI-VITAMIN/MINERALS PO) Take by mouth. Give 1 tablet by mouth once a day  . polyethylene glycol (MIRALAX / GLYCOLAX) packet Take 17 g by mouth daily.  . [DISCONTINUED] beta carotene w/minerals (OCUVITE) tablet Take 1 tablet by mouth daily. Reported on 07/24/2015  . [DISCONTINUED] carvedilol (COREG) 12.5 MG tablet Take 1/2 tablet by mouth twice daily.  . [DISCONTINUED] [employer] D3 5000 units capsule TAKE 1 CAPSULE (5,000 UNITS TOTAL) BY MOUTH DAILY.   No facility-administered encounter medications on file as of 08/13/2017.     Review of Systems   In general she is not complaining of fever or chills.  Skin does not complain of rashes or itching or diaphoresis.  Head ears eyes nose mouth and throat not complaining of sore throat does have limited vision with a history of macular degeneration.  Respiratory is not complaining of shortness of breath or cough.  Cardiac is not complaining of chest pain continues to have some mild lower extremity edema.  GI is not complaining of abdominal pain nausea vomiting diarrhea constipation.  GU does not complain of dysuria has been on antibiotic for UTI.  Musculoskeletal has weakness but does not complain of joint  pain.  Neurologic is not complaining at this time of dizziness or headaches there have been no seizures during her stay here to my knowledge.  Psych again main complaint is anxiety more so at night making it difficult to sleep  Immunization History  Administered Date(s) Administered  . Influenza Split 11/30/2012  . Influenza,inj,Quad PF,6+ Mos 11/30/2014, 01/09/2017  . Influenza-Unspecified 01/17/2012, 12/21/2013  . Pneumococcal Conjugate-13 10/08/2013  . Pneumococcal Polysaccharide-23 10/24/2015  . Td 03/20/2011   Pertinent  Health Maintenance Due  Topic Date Due  . FOOT EXAM  09/06/2017 (Originally 03/12/2017)  . OPHTHALMOLOGY EXAM  09/06/2017 (Originally 08/01/2017)  . URINE MICROALBUMIN  09/06/2017 (Originally 09/07/2015)  . INFLUENZA VACCINE  10/16/2017  . HEMOGLOBIN A1C  12/13/2017  . DEXA SCAN  Completed  . PNA vac Low Risk Adult  Completed   Fall Risk  06/24/2017 09/30/2016 06/05/2015 11/30/2014 07/06/2013  Falls in the past year?  No No No No No  Risk for fall due to : Impaired balance/gait;Impaired mobility;History of fall(s);Medication side effect - - - -   Functional Status Survey:    She is afebrile pulse is 78 respirations 18 blood pressure 132/64  Physical Exam   In general this is a pleasant elderly female in no distress sitting comfortably in her wheelchair.  Skin is warm and dry.  Eyes visual acuity appears to be at baseline with limited vision with history of macular degeneration.  Sclera and conjunctive are clear.  Oropharynx is clear mucous membranes moist.  Clear to auscultation there is no labored breathing.  Heart is regular rate and rhythm with an occasional irregular beat she has quite mild lower extremity edema.  Abdomen somewhat obese soft nontender with positive bowel sounds.  Though does move all extremities x4 it appears at baseline largely related wheelchair.  Neurologic could not appreciate lateralizing findings speech is clear she is  alert.  Psych she is alert and oriented very pleasant and appropriate.    Labs reviewed: Recent Labs    10/07/16 1119  08/03/17 0649 08/04/17 0529 07/25/2017 0436 08/07/17 0744  NA  --    < > 140 140 139 140  K  --    < > 4.4 4.5 4.5 4.4  CL  --    < > 106 108 109 110  CO2  --    < > 24 24 24 23   GLUCOSE  --    < > 90 97 151* 123*  BUN  --    < > 25* 20 24* 34*  CREATININE  --    < > 0.97 0.91 0.93 0.93  CALCIUM 10.2   < > 10.2 9.6 9.8 9.4  MG 1.9  --  1.9 1.9 2.1  --   PHOS 3.0  --   --   --   --   --    < > = values in this interval not displayed.   Recent Labs    08/03/17 0649 08/04/17 0529 07/24/2017 0436  AST 18 16 18   ALT 9* 9* 9*  ALKPHOS 63 57 64  BILITOT 1.1 0.5 0.5  PROT 7.4 6.8 7.4  ALBUMIN 3.4* 3.1* 3.4*   Recent Labs    08/02/17 1159 08/03/17 0649 07/30/2017 0436 08/07/17 0744  WBC 14.3* 9.6 8.2 16.6*  NEUTROABS 10.6* 7.3 7.3  --   HGB 11.6* 11.3* 11.8* 11.3*  HCT 35.7* 35.9* 37.6 36.1  MCV 96.7 97.6 97.7 97.0  PLT 287 267 289 312   Lab Results  Component Value Date   TSH 3.140 08/02/2017   Lab Results  Component Value Date   HGBA1C 5.3 06/12/2017   Lab Results  Component Value Date   CHOL 147 05/09/2016   HDL 36 (L) 05/09/2016   LDLCALC 92 05/09/2016   TRIG 95 05/09/2016   CHOLHDL 4.1 05/09/2016    Significant Diagnostic Results in last 30 days:  Dg Tibia/fibula Right  Result Date: 07/22/2017 CLINICAL DATA:  Fall in shower with right leg pain. Initial encounter. EXAM: RIGHT TIBIA AND FIBULA - 2 VIEW COMPARISON:  12/04/2011 FINDINGS: Sizable knee joint effusion that was also seen on the 2018 knee film. Remote bimalleolar fractures with completed healing. No acute fracture or malalignment is seen. Nonspecific soft tissue swelling about the ankle. IMPRESSION: 1. No acute finding. 2. Knee joint effusion also seen on 02/19/2017 radiograph. Electronically Signed   By: Monte Fantasia M.D.   On: 07/22/2017 13:53  Dg Ankle Complete  Left  Result Date: 08/02/2017 CLINICAL DATA:  Fall in bathroom with left ankle pain. EXAM: LEFT ANKLE COMPLETE - 3+ VIEW COMPARISON:  None. FINDINGS: Mild soft tissue swelling over the ankle. Ankle mortise is within normal. No evidence of acute fracture or dislocation. IMPRESSION: No acute fracture. Electronically Signed   By: Marin Olp M.D.   On: 08/02/2017 08:47   Dg Ankle Complete Right  Result Date: 08/02/2017 CLINICAL DATA:  Fall in bathroom with right ankle pain. EXAM: RIGHT ANKLE - COMPLETE 3+ VIEW COMPARISON:  07/22/2017 FINDINGS: Examination demonstrates minimal soft tissue swelling over the ankle. No evidence of acute fracture or dislocation. Old healed distal fibular and medial malleolar fractures. Slight stable widening of the medial aspect of the ankle mortise. Small inferior calcaneal spur. IMPRESSION: No acute fracture. Old bimalleolar fractures and stable widening of the medial aspect of the ankle mortise. Electronically Signed   By: Marin Olp M.D.   On: 08/02/2017 08:46   Dg Ankle Complete Right  Result Date: 07/22/2017 CLINICAL DATA:  Fall in shower with right leg pain. Initial encounter. EXAM: RIGHT ANKLE - COMPLETE 3+ VIEW COMPARISON:  03/16/2012 FINDINGS: Soft tissue swelling. Remote medial and lateral malleolus fractures that are healed. Degenerative spurring at the ankle joint. Negative for joint effusion. Heel spur. Osteopenia. IMPRESSION: 1. Soft tissue swelling without acute osseous finding. 2. Remote and healed bimalleolar fractures. Electronically Signed   By: Monte Fantasia M.D.   On: 07/22/2017 13:49   Ct Head Wo Contrast  Result Date: 08/02/2017 CLINICAL DATA:  Fall.  Hit left side of head. EXAM: CT HEAD WITHOUT CONTRAST CT CERVICAL SPINE WITHOUT CONTRAST TECHNIQUE: Multidetector CT imaging of the head and cervical spine was performed following the standard protocol without intravenous contrast. Multiplanar CT image reconstructions of the cervical spine were also  generated. COMPARISON:  CT head dated December 04, 2011. FINDINGS: CT HEAD FINDINGS Brain: There is new hypodensity within the right parietal lobe surrounding a questionable mass lesion (series 4, image 52; series 5, image 25). There is mild effacement of the adjacent sulci. No evidence of acute infarction, hemorrhage, hydrocephalus, or extra-axial collection. Stable mild cerebral atrophy and chronic microvascular ischemic changes. Vascular: Atherosclerotic vascular calcification of the carotid siphons. No hyperdense vessel. Skull: Negative for fracture or focal lesion. Sinuses/Orbits: No acute finding. Other: None. CT CERVICAL SPINE FINDINGS Alignment: Normal. Skull base and vertebrae: No acute fracture. No primary bone lesion or focal pathologic process. Soft tissues and spinal canal: No prevertebral fluid or swelling. No visible canal hematoma. Disc levels: Mild disc height loss from C4-C5 through C6-C7. Moderate uncovertebral hypertrophy at C5-C6 and C6-C7. Severe facet arthropathy throughout the cervical spine with fusion of the right C4-C5 and left C3-C4 facet joints. Upper chest: Negative. Other: None. IMPRESSION: 1. New questionable mass lesion in the right parietal lobe with surrounding vasogenic edema. Recommend contrast-enhanced MRI of the brain for further evaluation. 2. No evidence of traumatic injury within the head or cervical spine. Electronically Signed   By: Titus Dubin M.D.   On: 08/02/2017 08:29   Ct Chest W Contrast  Result Date: 08/02/2017 CLINICAL DATA:  Initial workup due to presumed CNS metastasis. EXAM: CT CHEST, ABDOMEN, AND PELVIS WITH CONTRAST TECHNIQUE: Multidetector CT imaging of the chest, abdomen and pelvis was performed following the standard protocol during bolus administration of intravenous contrast. CONTRAST:  75 cc ISOVUE-300 IOPAMIDOL (ISOVUE-300) INJECTION 61%, 70mL ISOVUE-300 IOPAMIDOL (ISOVUE-300) INJECTION 61% COMPARISON:  08/02/2017 head CT, chest radiograph  05/28/2017 FINDINGS: CT CHEST FINDINGS Cardiovascular: Retroesophageal course of the internal carotid arteries bilaterally. Conventional branch pattern of the great vessels without significant stenosis. Ectatic ascending thoracic aorta to 3.7 cm. No thoracic aortic dissection. Minimal coronary arteriosclerosis along left main and proximal LAD. No acute pulmonary embolus to the segmental level. Heart size is borderline enlarged without pericardial effusion. Mediastinum/Nodes: Thyroid goiter with coarse calcifications within the left lobe. Subcentimeter cystic nodules are identified the lower poles of both lobes. Patent midline trachea and mainstem bronchi. No mediastinal hilar lymphadenopathy. Lungs/Pleura: No dominant mass or pulmonary consolidation. Multilobar bilateral subpleural areas of atelectasis and/or scarring. Mild peribronchial thickening within both lower lobes with subpleural areas of interstitial prominence and/or fibrosis in the lower lobes bilaterally. Small paraseptal foci of emphysema along the periphery of the right middle and both lower lobes. No effusion or pneumothorax. Musculoskeletal: Subchondral cystic change of both humeral heads and glenoid fossae. No aggressive osteolytic or blastic disease. Degenerative disc disease T7 through T10. CT ABDOMEN PELVIS FINDINGS Hepatobiliary: The liver enhances homogeneously. The gallbladder is physiologically distended without calculus. No biliary dilatation noted. Pancreas: No enhancing pancreatic mass or ductal dilatation. No inflammation. There appears to be mild side branch ductal ectasia of the pancreatic tail. Tiny side-branch intraductal papillary mucinous neoplasms are not entirely excluded, series 2/54. Spleen: Normal size.  No enhancing mass. Adrenals/Urinary Tract: Normal bilateral adrenal glands. 2.2 cm upper pole right renal cyst, simple in appearance with smaller subcentimeter hypodense lesions of both kidneys statistically consistent with  cysts in the interpolar aspect of both kidneys and right lower pole. No nephrolithiasis, enhancing renal mass nor obstructive uropathy. The urinary bladder demonstrates no calculus or focal mural thickening. No focal mass. Stomach/Bowel: Small hiatal hernia. Decompressed stomach. Normal small bowel rotation is noted. No small-bowel dilatation or mass. No inflammation. The distal terminal ileum are unremarkable. No findings of acute appendicitis. Scattered colonic diverticulosis without acute diverticulitis. No annular constricting lesions. Moderate fecal retention within the descending and sigmoid colon. Vascular/Lymphatic: Mild-to-moderate aortic atherosclerosis. No aneurysm or adenopathy. Reproductive: Hysterectomy.  No adnexal mass. Other: No free air nor free fluid. Musculoskeletal: Degenerative disc disease L4-5. No aggressive osteolytic or blastic disease. Multilevel degenerative facet arthropathy of the lumbar spine. Sclerosis about the pubic symphysis and both SI joints. Lumbosacral transitional vertebral anatomy with pseudoarticulation of L5 with S1 on the left. IMPRESSION: Chest CT: 1. Multinodular goiter. 2. Coronary arteriosclerosis and aortic atherosclerosis. No aneurysm or acute pulmonary embolus. 3. No pulmonary lesions. 4. Minimal subpleural areas of bilateral atelectasis and/or fibrosis. CT AP: 1. Tiny cystic foci in the pancreatic tail may represent ectatic pancreatic duct side branches. Side branch IPMN might also be a possibility. 2. Bilateral renal cysts, the largest measuring 2.2 cm in the upper pole. The remainder are too small to further characterize in the interpolar aspect of both kidneys and right lower pole. 3. Scattered colonic diverticulosis without acute diverticulitis. Masslike abnormality identified. Electronically Signed   By: Ashley Royalty M.D.   On: 08/02/2017 19:16   Ct Cervical Spine Wo Contrast  Result Date: 08/02/2017 CLINICAL DATA:  Fall.  Hit left side of head. EXAM: CT  HEAD WITHOUT CONTRAST CT CERVICAL SPINE WITHOUT CONTRAST TECHNIQUE: Multidetector CT imaging of the head and cervical spine was performed following the standard protocol without intravenous contrast. Multiplanar CT image reconstructions of the cervical spine were also generated. COMPARISON:  CT head dated December 04, 2011. FINDINGS: CT HEAD FINDINGS Brain: There is new hypodensity within the right parietal lobe surrounding  a questionable mass lesion (series 4, image 52; series 5, image 25). There is mild effacement of the adjacent sulci. No evidence of acute infarction, hemorrhage, hydrocephalus, or extra-axial collection. Stable mild cerebral atrophy and chronic microvascular ischemic changes. Vascular: Atherosclerotic vascular calcification of the carotid siphons. No hyperdense vessel. Skull: Negative for fracture or focal lesion. Sinuses/Orbits: No acute finding. Other: None. CT CERVICAL SPINE FINDINGS Alignment: Normal. Skull base and vertebrae: No acute fracture. No primary bone lesion or focal pathologic process. Soft tissues and spinal canal: No prevertebral fluid or swelling. No visible canal hematoma. Disc levels: Mild disc height loss from C4-C5 through C6-C7. Moderate uncovertebral hypertrophy at C5-C6 and C6-C7. Severe facet arthropathy throughout the cervical spine with fusion of the right C4-C5 and left C3-C4 facet joints. Upper chest: Negative. Other: None. IMPRESSION: 1. New questionable mass lesion in the right parietal lobe with surrounding vasogenic edema. Recommend contrast-enhanced MRI of the brain for further evaluation. 2. No evidence of traumatic injury within the head or cervical spine. Electronically Signed   By: Titus Dubin M.D.   On: 08/02/2017 08:29   Mr Jodene Nam Head Wo Contrast  Result Date: 08/04/2017 CLINICAL DATA:  Recent fall. Seizure. Abnormal neuro exam. Abnormal CT of the head. EXAM: MRI HEAD WITHOUT CONTRAST MRA HEAD WITHOUT CONTRAST TECHNIQUE: Multiplanar, multiecho pulse  sequences of the brain and surrounding structures were obtained without intravenous contrast. Angiographic images of the head were obtained using MRA technique without contrast. COMPARISON:  CT head without contrast 08/02/2017. FINDINGS: MRI HEAD FINDINGS Brain: A mass lesion in the medial right parietal lobe measures at least 2.9 x 2.0 x 3.7 cm. There is significant surrounding vasogenic edema. This effaces the sulci and has some mass effect on the right lateral ventricle. There is no hydrocephalus. Restricted diffusion is evident within the lesion and scattered throughout the adjacent white matter. Additional cortical restricted diffusion is present separate from the measured mass lesion. Abnormal T2 signal extends into the splenium of the corpus callosum. There is additional signal surrounding the atrium of the right lateral ventricle. No acute hemorrhage is present. Mild periventricular white matter changes are present otherwise. No significant extra-axial fluid collection is present. The internal auditory canals are within normal limits bilaterally. The brainstem is normal. Remote lacunar infarcts are present in the inferior cerebellum bilaterally. No acute abnormality is present. Vascular: Flow is present in the major intracranial arteries. Skull and upper cervical spine: The skull base is within normal limits. The craniocervical junction is normal. Sinuses/Orbits: The paranasal sinuses and mastoid air cells are clear. MRA HEAD FINDINGS Atherosclerotic irregularity is present within the cavernous internal carotid arteries bilaterally. There is mild narrowing of approximately 50% at the ophthalmic segment on the right. ICA termini are intact. A moderate proximal right A1 segment stenosis is present. A moderate proximal left M1 segment stenosis is present. The anterior communicating artery is patent. There is asymmetric attenuation of the distal right M1 segment and right MCA branch vessels compared to the left.  The vertebral arteries are codominant. PICA origins are not visualized. Vertebrobasilar junction is normal. Both posterior cerebral arteries originate from basilar tip. There is signal loss in the PCA branches bilaterally. IMPRESSION: 1. Infiltrative mass lesion in the medial right parietal lobe measures at least 2.9 x 2.0 x 3.7 cm. 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 MRI with contrast could be used for further evaluation if clinically indicated.  T2 signal changes separate from the measured lesion likely represent infiltrative disease is well. 2. Mass effect from the primary lesion and surrounding vasogenic edema without hydrocephalus or downward herniation. 3. Mild stenosis involving the ophthalmic segment of the right internal carotid artery. 4. Moderate right A1 and left M1 proximal stenoses. 5. Asymmetric attenuation of the distal right M1 segment and right MCA branch vessels compared to the left. Electronically Signed   By: San Morelle M.D.   On: 08/04/2017 08:57   Mr Brain Wo Contrast  Result Date: 08/04/2017 CLINICAL DATA:  Recent fall. Seizure. Abnormal neuro exam. Abnormal CT of the head. EXAM: MRI HEAD WITHOUT CONTRAST MRA HEAD WITHOUT CONTRAST TECHNIQUE: Multiplanar, multiecho pulse sequences of the brain and surrounding structures were obtained without intravenous contrast. Angiographic images of the head were obtained using MRA technique without contrast. COMPARISON:  CT head without contrast 08/02/2017. FINDINGS: MRI HEAD FINDINGS Brain: A mass lesion in the medial right parietal lobe measures at least 2.9 x 2.0 x 3.7 cm. There is significant surrounding vasogenic edema. This effaces the sulci and has some mass effect on the right lateral ventricle. There is no hydrocephalus. Restricted diffusion is evident within the lesion and scattered throughout the adjacent  white matter. Additional cortical restricted diffusion is present separate from the measured mass lesion. Abnormal T2 signal extends into the splenium of the corpus callosum. There is additional signal surrounding the atrium of the right lateral ventricle. No acute hemorrhage is present. Mild periventricular white matter changes are present otherwise. No significant extra-axial fluid collection is present. The internal auditory canals are within normal limits bilaterally. The brainstem is normal. Remote lacunar infarcts are present in the inferior cerebellum bilaterally. No acute abnormality is present. Vascular: Flow is present in the major intracranial arteries. Skull and upper cervical spine: The skull base is within normal limits. The craniocervical junction is normal. Sinuses/Orbits: The paranasal sinuses and mastoid air cells are clear. MRA HEAD FINDINGS Atherosclerotic irregularity is present within the cavernous internal carotid arteries bilaterally. There is mild narrowing of approximately 50% at the ophthalmic segment on the right. ICA termini are intact. A moderate proximal right A1 segment stenosis is present. A moderate proximal left M1 segment stenosis is present. The anterior communicating artery is patent. There is asymmetric attenuation of the distal right M1 segment and right MCA branch vessels compared to the left. The vertebral arteries are codominant. PICA origins are not visualized. Vertebrobasilar junction is normal. Both posterior cerebral arteries originate from basilar tip. There is signal loss in the PCA branches bilaterally. IMPRESSION: 1. Infiltrative mass lesion in the medial right parietal lobe measures at least 2.9 x 2.0 x 3.7 cm. 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 MRI with contrast could be used for further evaluation if clinically indicated. T2 signal  changes separate from the measured lesion likely represent infiltrative disease is well. 2. Mass effect from the primary lesion and surrounding vasogenic edema without hydrocephalus or downward herniation. 3. Mild stenosis involving the ophthalmic segment of the right internal carotid artery. 4. Moderate right A1 and left M1 proximal stenoses. 5. Asymmetric attenuation of the distal right M1 segment and right MCA branch vessels compared to the left. Electronically Signed   By: San Morelle M.D.   On: 08/04/2017 08:57   Mr Brain W Contrast  Result Date: 08/04/2017 CLINICAL DATA:  Altered mental status for 2 days. Follow-up evaluation.  EXAM: MRI HEAD WITH CONTRAST TECHNIQUE: Multiplanar, multiecho pulse sequences of the brain and surrounding structures were obtained with intravenous contrast. CONTRAST:  25mL MULTIHANCE GADOBENATE DIMEGLUMINE 529 MG/ML IV SOLN COMPARISON:  MRI of the head without contrast Aug 04, 2017 at 0825 hours FINDINGS: Brain: 3 x 3.4 x 5.4 cm heterogeneously enhancing lobulated RIGHT mesial parietal lobe mass corresponding to previous abnormality. There 2 adjacent nodules within the splenium of the corpus callosum measuring to 18 x 8 mm. In addition, 4 mm nodular enhancement RIGHT inferior frontal cortex. No abnormal extra-axial enhancement. Vascular: Not tailored for evaluation. Skull and upper cervical spine: No abnormal osseous enhancement. Sinuses/Orbits: No abnormal enhancement. Other: None. IMPRESSION: 1. Four suspicious foci of intraparenchymal enhancement, dominant lesion RIGHT parietal lobe 3 x 3.4 x 5.4 cm. 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 Electronically Signed   By: Elon Alas M.D.   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 EXAM: CT CHEST, ABDOMEN, AND PELVIS WITH CONTRAST TECHNIQUE: Multidetector CT imaging of the chest, abdomen and  pelvis was performed following the standard protocol during bolus administration of intravenous contrast. CONTRAST:  75 cc ISOVUE-300 IOPAMIDOL (ISOVUE-300) INJECTION 61%, 22mL ISOVUE-300 IOPAMIDOL (ISOVUE-300) INJECTION 61% COMPARISON:  08/02/2017 head CT, chest radiograph 05/28/2017 FINDINGS: CT CHEST FINDINGS Cardiovascular: Retroesophageal course of the internal carotid arteries bilaterally. Conventional branch pattern of the great vessels without significant stenosis. Ectatic ascending thoracic aorta to 3.7 cm. No thoracic aortic dissection. Minimal coronary arteriosclerosis along left main and proximal LAD. No acute pulmonary embolus to the segmental level. Heart size is borderline enlarged without pericardial effusion. Mediastinum/Nodes: Thyroid goiter with coarse calcifications within the left lobe. Subcentimeter cystic nodules are identified the lower poles of both lobes. Patent midline trachea and mainstem bronchi. No mediastinal hilar lymphadenopathy. Lungs/Pleura: No dominant mass or pulmonary consolidation. Multilobar bilateral subpleural areas of atelectasis and/or scarring. Mild peribronchial thickening within both lower lobes with subpleural areas of interstitial prominence and/or fibrosis in the lower lobes bilaterally. Small paraseptal foci of emphysema along the periphery of the right middle and both lower lobes. No effusion or pneumothorax. Musculoskeletal: Subchondral cystic change of both humeral heads and glenoid fossae. No aggressive osteolytic or blastic disease. Degenerative disc disease T7 through T10. CT ABDOMEN PELVIS FINDINGS Hepatobiliary: The liver enhances homogeneously. The gallbladder is physiologically distended without calculus. No biliary dilatation noted. Pancreas: No enhancing pancreatic mass or ductal dilatation. No inflammation. There appears to be mild side branch ductal ectasia of the pancreatic tail. Tiny side-branch intraductal papillary mucinous neoplasms are not  entirely excluded, series 2/54. Spleen: Normal size.  No enhancing mass. Adrenals/Urinary Tract: Normal bilateral adrenal glands. 2.2 cm upper pole right renal cyst, simple in appearance with smaller subcentimeter hypodense lesions of both kidneys statistically consistent with cysts in the interpolar aspect of both kidneys and right lower pole. No nephrolithiasis, enhancing renal mass nor obstructive uropathy. The urinary bladder demonstrates no calculus or focal mural thickening. No focal mass. Stomach/Bowel: Small hiatal hernia. Decompressed stomach. Normal small bowel rotation is noted. No small-bowel dilatation or mass. No inflammation. The distal terminal ileum are unremarkable. No findings of acute appendicitis. Scattered colonic diverticulosis without acute diverticulitis. No annular constricting lesions. Moderate fecal retention within the descending and sigmoid colon. Vascular/Lymphatic: Mild-to-moderate aortic atherosclerosis. No aneurysm or adenopathy. Reproductive: Hysterectomy.  No adnexal mass. Other: No free air nor free fluid. Musculoskeletal: Degenerative disc disease L4-5. No  aggressive osteolytic or blastic disease. Multilevel degenerative facet arthropathy of the lumbar spine. Sclerosis about the pubic symphysis and both SI joints. Lumbosacral transitional vertebral anatomy with pseudoarticulation of L5 with S1 on the left. IMPRESSION: Chest CT: 1. Multinodular goiter. 2. Coronary arteriosclerosis and aortic atherosclerosis. No aneurysm or acute pulmonary embolus. 3. No pulmonary lesions. 4. Minimal subpleural areas of bilateral atelectasis and/or fibrosis. CT AP: 1. Tiny cystic foci in the pancreatic tail may represent ectatic pancreatic duct side branches. Side branch IPMN might also be a possibility. 2. Bilateral renal cysts, the largest measuring 2.2 cm in the upper pole. The remainder are too small to further characterize in the interpolar aspect of both kidneys and right lower pole. 3.  Scattered colonic diverticulosis without acute diverticulitis. Masslike abnormality identified. Electronically Signed   By: Ashley Royalty M.D.   On: 08/02/2017 19:16   Dg Knee Complete 4 Views Right  Result Date: 08/02/2017 CLINICAL DATA:  Fall in bathroom with right knee pain. EXAM: RIGHT KNEE - COMPLETE 4+ VIEW COMPARISON:  None. FINDINGS: Mild diffuse osteopenia. Mild tricompartmental osteoarthritic change worse over the lateral compartment. No evidence of acute fracture or dislocation. Small joint effusion is present. IMPRESSION: No acute fracture. Small joint effusion. Mild tricompartmental osteoarthritis. Electronically Signed   By: Marin Olp M.D.   On: 08/02/2017 08:45   Dg Knee Complete 4 Views Right  Result Date: 07/22/2017 CLINICAL DATA:  Fall in shower with right leg pain. Initial encounter. EXAM: RIGHT KNEE - COMPLETE 4+ VIEW COMPARISON:  02/19/2017 FINDINGS: Sizable joint effusion that was also seen previously. No definite fat component. No fracture or malalignment, sclerosis below the lateral tibial plateau is stable. Generalized degenerative marginal spurring. Osteopenia. IMPRESSION: 1. Large joint effusion which was also seen on a 02/19/2017 study. No visible fracture. 2. Mild generalized degenerative spurring. Electronically Signed   By: Monte Fantasia M.D.   On: 07/22/2017 13:51   Dg Foot Complete Right  Result Date: 07/22/2017 CLINICAL DATA:  Fall in shower with right leg pain. Initial encounter. EXAM: RIGHT FOOT COMPLETE - 3+ VIEW COMPARISON:  None. FINDINGS: Soft tissue swelling at the ankle as described on dedicated imaging. Bones are osteopenic. No acute fracture or malalignment. Small heel spur. Hallux valgus with early toe crossing. Osteopenia. IMPRESSION: No acute osseous finding. Electronically Signed   By: Monte Fantasia M.D.   On: 07/22/2017 13:54   Dg Hip Unilat W Or Wo Pelvis 2-3 Views Left  Result Date: 08/02/2017 CLINICAL DATA:  Fall in bathroom with left-sided  pain. EXAM: DG HIP (WITH OR WITHOUT PELVIS) 2-3V LEFT COMPARISON:  07/22/2017 FINDINGS: Mild diffuse osteopenia. Mild symmetric degenerative change of the hips. No evidence of acute fracture or dislocation. There are degenerative changes of the spine and sacroiliac joints. IMPRESSION: No acute fracture. Electronically Signed   By: Marin Olp M.D.   On: 08/02/2017 08:49   Dg Hip Unilat With Pelvis 2-3 Views Left  Result Date: 07/22/2017 CLINICAL DATA:  Fall from sitting. EXAM: DG HIP (WITH OR WITHOUT PELVIS) 2-3V LEFT COMPARISON:  None. FINDINGS: There is no evidence of hip fracture or dislocation. There is no evidence of arthropathy or other focal bone abnormality. Sclerotic change of the pubic symphysis. IMPRESSION: Normal left hip radiographs for age. Electronically Signed   By: Ulyses Jarred M.D.   On: 07/22/2017 13:44    Assessment/Plan  #1 history of brain lesion again she will have oncology follow-up continues on dexamethasone- at this point appears to be stable--she is  followed by oncology and neurosurgery-- Have been concerns for metastasis CT scan so far has been negative.  2.  Partial seizures involving left side-again has been started on Keppra no further seizures monitor.  3.  History of hypercalcemia  did receive a dose of Zometa in the hospital-calcium level did normalize update lab is pending.--She also has a history of hyperparathyroidism which I suspect is contributing to this as well.  4.  History of anxiety we will increase her at bedtime Ativan to 0.5 mg as needed and monitor.  5.  Diabetes type 2 this appears to be doing well diet controlled despite being on the dexamethasone CBGs have been largely in the mid 100s  6.  Hypertension this appears to be stable at the moment- she is on Coreg as well as Norvasc.  [id number] note  greater than 25 minutes spent assessing patient- discussing her concerns of bedside- reviewing her chart and labs- coordinating plan of care

## 2017-08-14 ENCOUNTER — Encounter (HOSPITAL_COMMUNITY)
Admission: RE | Admit: 2017-08-14 | Discharge: 2017-08-14 | Disposition: A | Discharge status: Home / Self Care | Payer: Medicare Other | Source: Skilled Nursing Facility | Attending: Internal Medicine | Admitting: Internal Medicine

## 2017-08-14 DIAGNOSIS — E119 Type 2 diabetes mellitus without complications: Secondary | ICD-10-CM | POA: Insufficient documentation

## 2017-08-14 DIAGNOSIS — E213 Hyperparathyroidism, unspecified: Secondary | ICD-10-CM | POA: Insufficient documentation

## 2017-08-14 DIAGNOSIS — C713 Malignant neoplasm of parietal lobe: Secondary | ICD-10-CM | POA: Insufficient documentation

## 2017-08-14 DIAGNOSIS — E030 Congenital hypothyroidism with diffuse goiter: Secondary | ICD-10-CM | POA: Insufficient documentation

## 2017-08-14 LAB — COMPREHENSIVE METABOLIC PANEL
ALT: 26 U/L (ref 14–54)
AST: 26 U/L (ref 15–41)
Albumin: 3.2 g/dL — ABNORMAL LOW (ref 3.5–5.0)
Alkaline Phosphatase: 59 U/L (ref 38–126)
Anion gap: 8 (ref 5–15)
BUN: 54 mg/dL — AB (ref 6–20)
CHLORIDE: 106 mmol/L (ref 101–111)
CO2: 22 mmol/L (ref 22–32)
CREATININE: 0.83 mg/dL (ref 0.44–1.00)
Calcium: 9.5 mg/dL (ref 8.9–10.3)
GFR calc Af Amer: >60 mL/min (ref >60)
GFR calc non Af Amer: >60 mL/min (ref >60)
Glucose, Bld: 123 mg/dL — ABNORMAL HIGH (ref 65–99)
Potassium: 4.9 mmol/L (ref 3.5–5.1)
SODIUM: 136 mmol/L (ref 135–145)
Total Bilirubin: 0.7 mg/dL (ref 0.3–1.2)
Total Protein: 6.5 g/dL (ref 6.5–8.1)

## 2017-08-15 ENCOUNTER — Ambulatory Visit: Payer: Medicare Other | Admitting: Orthopedic Surgery

## 2017-08-15 ENCOUNTER — Encounter (HOSPITAL_COMMUNITY)
Admission: RE | Admit: 2017-08-15 | Discharge: 2017-08-15 | Disposition: A | Discharge status: Home / Self Care | Payer: Medicare Other | Source: Skilled Nursing Facility | Attending: Internal Medicine | Admitting: Internal Medicine

## 2017-08-15 DIAGNOSIS — E213 Hyperparathyroidism, unspecified: Secondary | ICD-10-CM | POA: Insufficient documentation

## 2017-08-15 DIAGNOSIS — C713 Malignant neoplasm of parietal lobe: Secondary | ICD-10-CM | POA: Insufficient documentation

## 2017-08-15 DIAGNOSIS — E119 Type 2 diabetes mellitus without complications: Secondary | ICD-10-CM | POA: Insufficient documentation

## 2017-08-15 DIAGNOSIS — E030 Congenital hypothyroidism with diffuse goiter: Secondary | ICD-10-CM | POA: Insufficient documentation

## 2017-08-16 DEATH — deceased

## 2017-08-17 ENCOUNTER — Encounter: Payer: Self-pay | Admitting: Internal Medicine

## 2017-08-18 ENCOUNTER — Non-Acute Institutional Stay (SKILLED_NURSING_FACILITY): Payer: Medicare Other | Admitting: Internal Medicine

## 2017-08-18 ENCOUNTER — Encounter: Payer: Self-pay | Admitting: Internal Medicine

## 2017-08-18 ENCOUNTER — Encounter (HOSPITAL_COMMUNITY)
Admission: RE | Admit: 2017-08-18 | Discharge: 2017-08-18 | Disposition: A | Discharge status: Home / Self Care | Payer: Medicare Other | Source: Skilled Nursing Facility | Attending: Internal Medicine | Admitting: Internal Medicine

## 2017-08-18 DIAGNOSIS — I10 Essential (primary) hypertension: Secondary | ICD-10-CM | POA: Diagnosis not present

## 2017-08-18 DIAGNOSIS — E213 Hyperparathyroidism, unspecified: Secondary | ICD-10-CM | POA: Insufficient documentation

## 2017-08-18 DIAGNOSIS — G939 Disorder of brain, unspecified: Secondary | ICD-10-CM

## 2017-08-18 DIAGNOSIS — E119 Type 2 diabetes mellitus without complications: Secondary | ICD-10-CM | POA: Insufficient documentation

## 2017-08-18 DIAGNOSIS — D72829 Elevated white blood cell count, unspecified: Secondary | ICD-10-CM | POA: Diagnosis not present

## 2017-08-18 DIAGNOSIS — E030 Congenital hypothyroidism with diffuse goiter: Secondary | ICD-10-CM | POA: Insufficient documentation

## 2017-08-18 DIAGNOSIS — C713 Malignant neoplasm of parietal lobe: Secondary | ICD-10-CM | POA: Insufficient documentation

## 2017-08-18 LAB — CBC WITH DIFFERENTIAL/PLATELET
Basophils Absolute: 0 10*3/uL (ref 0.0–0.1)
Basophils Relative: 0 %
EOS ABS: 0 10*3/uL (ref 0.0–0.7)
EOS PCT: 0 %
HCT: 37.1 % (ref 36.0–46.0)
Hemoglobin: 12.1 g/dL (ref 12.0–15.0)
LYMPHS ABS: 1 10*3/uL (ref 0.7–4.0)
Lymphocytes Relative: 4 %
MCH: 31.3 pg (ref 26.0–34.0)
MCHC: 32.6 g/dL (ref 30.0–36.0)
MCV: 95.9 fL (ref 78.0–100.0)
MONO ABS: 1.2 10*3/uL — AB (ref 0.1–1.0)
Monocytes Relative: 5 %
Neutro Abs: 23.9 10*3/uL — ABNORMAL HIGH (ref 1.7–7.7)
Neutrophils Relative %: 91 %
PLATELETS: 145 10*3/uL — AB (ref 150–400)
RBC: 3.87 MIL/uL (ref 3.87–5.11)
RDW: 14.7 % (ref 11.5–15.5)
WBC: 26.1 10*3/uL — AB (ref 4.0–10.5)

## 2017-08-18 LAB — BASIC METABOLIC PANEL
Anion gap: 6 (ref 5–15)
BUN: 56 mg/dL — AB (ref 6–20)
CHLORIDE: 105 mmol/L (ref 101–111)
CO2: 27 mmol/L (ref 22–32)
Calcium: 10 mg/dL (ref 8.9–10.3)
Creatinine, Ser: 0.8 mg/dL (ref 0.44–1.00)
GFR calc Af Amer: >60 mL/min (ref >60)
GFR calc non Af Amer: >60 mL/min (ref >60)
GLUCOSE: 122 mg/dL — AB (ref 65–99)
Potassium: 5.5 mmol/L — ABNORMAL HIGH (ref 3.5–5.1)
SODIUM: 138 mmol/L (ref 135–145)

## 2017-08-18 NOTE — Progress Notes (Signed)
Location:   [facility] Room Number: 155/P Place of Service:  SNF (31) Provider:  Clydene Pugh, MD  Patient Care Team: Kathyrn Drown, MD as PCP - General (Family Medicine)  Extended Emergency Contact Information Primary Emergency Contact: Hedgepeth,[name] Address: [address]          [place], [place] [address] [name] of [place] Phone: [phone] Mobile Phone: [phone] Relation: Spouse Secondary Emergency Contact: cliborne,kathy Home Phone: [phone] Relation: Sister  Code Status:  DNR Goals of care: Advanced Directive information Advanced Directives 08/18/2017  Does Patient Have a Medical Advance Directive? Yes  Type of Advance Directive Out of facility DNR (pink MOST or yellow form)  Does patient want to make changes to medical advance directive? No - Patient declined  Copy of [place] in Chart? -  Would patient like information on creating a medical advance directive? No - Patient declined  Pre-existing out of facility DNR order (yellow form or pink MOST form) -     Chief Complaint  Patient presents with  . Acute Visit    Patient being seen for Increased WBC and Discharge Planning    HPI:  Pt is a 84 y.o. female seen today for an acute visit for Leucocytosis and Discharge planning. Patient has h/o Hypertension, Hypercalcemia with Hyperparathyroidism followed by Dr Dorris Fetch, CKD disease Stage 3 , Hypothyroid, And Macular Degeneration.  Patient presented to the ED for Recurrent falls at home. She says she has been not walking for past 1 year and has been dependent on her Husband. She was having more Frequent falls recently and husband was unable to take care of her. In the hospital CT scan of head  And MRI showed Brain Lesion in parietal area multifocal concerning for Metastatic Disease.  She was seen by oncology and plan for outpatient work Up.  She also had CT scan of abdomen and chest which was  negative for any malignancy. She also had UTI and is on antibiotics Her calcium was elevated she was treated with 1 dose of Zometa She also had non voluntary movements of Left Leg thought to be due to seizures and was started on Keppra.  Patient was started on Low dose of Ativan in facility at night for anxiety and she has done better since then. She is making slow progress with Therapy. Still continues to be Full assist. Though she is working on transfers. She sis see Neurosurgery and was told by them that she is not Surgical candidate. Patient has had Persistent Leucocytosis with WBC today of 26.  Patient continues to be Afebrile . Denies any fever or chills. No Cough or Chest pain. No SOB. No Dysuria.   Past Medical History:  Diagnosis Date  . Arthritis    Knees  . Chronic back pain   . Chronic pain of right knee   . Diabetes mellitus   . Gait abnormality   . Hypertension   . Macular degeneration   . Thyroid disease    Past Surgical History:  Procedure Laterality Date  . ABDOMINAL HYSTERECTOMY    . COLONOSCOPY    . SHOULDER SURGERY      Allergies  Allergen Reactions  . Aspirin   . Ibuprofen     Nausea or HTN  . Penicillins Rash    Has patient had a PCN reaction causing immediate rash, facial/tongue/throat swelling, SOB or lightheadedness with hypotension: unknown Has patient had a PCN reaction causing severe rash  involving mucus membranes or skin necrosis: unknown Has patient had a PCN reaction that required hospitalization: no Has patient had a PCN reaction occurring within the last 10 years: No If all of the above answers are "NO", then may proceed with Cephalosporin use.     Outpatient Encounter Medications as of 08/18/2017  Medication Sig  . acetaminophen (TYLENOL) 650 MG CR tablet Take 650 mg by mouth every 8 (eight) hours as needed for pain.   Marland Kitchen amLODipine (NORVASC) 2.5 MG tablet TAKE 1 TABLET BY MOUTH EVERY DAY  . carvedilol (COREG) 6.25 MG tablet Take 6.25 mg  by mouth 2 (two) times daily with a meal.  . Cholecalciferol 5000 units capsule Take 5,000 Units by mouth daily.  Marland Kitchen dexamethasone (DECADRON) 4 MG tablet Take 1 tablet (4 mg total) by mouth 2 (two) times daily with a meal.  . levETIRAcetam (KEPPRA) 500 MG tablet Take 1 tablet (500 mg total) by mouth 2 (two) times daily.  Marland Kitchen levothyroxine (SYNTHROID, LEVOTHROID) 150 MCG tablet Take 2 tablets on mon, wed, and Friday. Take one and a half tablets on tues, thurs, sat, and sun  . LORazepam (ATIVAN) 0.5 MG tablet Take 0.5 tablets (0.25 mg total) by mouth at bedtime as needed for anxiety.  . Multiple Vitamins-Minerals (MULTI-VITAMIN/MINERALS PO) Take by mouth. Give 1 tablet by mouth once a day  . polyethylene glycol (MIRALAX / GLYCOLAX) packet Take 17 g by mouth daily.   No facility-administered encounter medications on file as of 08/18/2017.      Review of Systems  Review of Systems  Constitutional: Negative for activity change, appetite change, chills, diaphoresis, fatigue and fever.  HENT: Negative for mouth sores, postnasal drip, rhinorrhea, sinus pain and sore throat.   Respiratory: Negative for apnea, cough, chest tightness, shortness of breath and wheezing.   Cardiovascular: Negative for chest pain, palpitations and leg swelling.  Gastrointestinal: Negative for abdominal distention, abdominal pain, constipation, diarrhea, nausea and vomiting.  Genitourinary: Negative for dysuria and frequency.  Musculoskeletal: Negative for arthralgias, joint swelling and myalgias.  Skin: Negative for rash.  Neurological: Negative for dizziness, syncope, weakness, light-headedness and numbness.  Psychiatric/Behavioral: Negative for behavioral problems, confusion and sleep disturbance.     Immunization History  Administered Date(s) Administered  . Influenza Split 11/30/2012  . Influenza,inj,Quad PF,6+ Mos 11/30/2014, 01/09/2017  . Influenza-Unspecified 01/17/2012, 12/21/2013  . Pneumococcal Conjugate-13  10/08/2013  . Pneumococcal Polysaccharide-23 10/24/2015  . Td 03/20/2011   Pertinent  Health Maintenance Due  Topic Date Due  . FOOT EXAM  09/06/2017 (Originally 03/12/2017)  . OPHTHALMOLOGY EXAM  09/06/2017 (Originally 08/01/2017)  . URINE MICROALBUMIN  09/06/2017 (Originally 09/07/2015)  . INFLUENZA VACCINE  10/16/2017  . HEMOGLOBIN A1C  12/13/2017  . DEXA SCAN  Completed  . PNA vac Low Risk Adult  Completed   Fall Risk  06/24/2017 09/30/2016 06/05/2015 11/30/2014 07/06/2013  Falls in the past year? No No No No No  Risk for fall due to : Impaired balance/gait;Impaired mobility;History of fall(s);Medication side effect - - - -   Functional Status Survey:    Vitals:   08/18/17 1608  BP: 115/68  Pulse: 64  Resp: 17  Temp: 97.6 F (36.4 C)   There is no height or weight on file to calculate BMI. Physical Exam  Constitutional: She is oriented to person, place, and time. She appears well-developed and well-nourished.  HENT:  Head: Normocephalic.  Mouth/Throat: Oropharynx is clear and moist.  Eyes: Pupils are equal, round, and reactive to light.  Neck:  Neck supple.  Cardiovascular: Normal rate and regular rhythm.  Pulmonary/Chest: Effort normal and breath sounds normal. No stridor. No respiratory distress. She has no wheezes.  Abdominal: Soft. Bowel sounds are normal. She exhibits no distension. There is no tenderness. There is no guarding.  Musculoskeletal:  Chronic Venous changes  Neurological: She is alert and oriented to person, place, and time.  Skin: Skin is warm and dry.  Psychiatric: She has a normal mood and affect. Her behavior is normal. Thought content normal.    Labs reviewed: Recent Labs    10/07/16 1119  08/03/17 0649 08/04/17 0529 08/04/2017 0436 08/07/17 0744 08/14/17 0726 08/18/17 0700  NA  --    < > 140 140 139 140 136 138  K  --    < > 4.4 4.5 4.5 4.4 4.9 5.5*  CL  --    < > 106 108 109 110 106 105  CO2  --    < > 24 24 24 23 22 27   GLUCOSE  --    < >  90 97 151* 123* 123* 122*  BUN  --    < > 25* 20 24* 34* 54* 56*  CREATININE  --    < > 0.97 0.91 0.93 0.93 0.83 0.80  CALCIUM 10.2   < > 10.2 9.6 9.8 9.4 9.5 10.0  MG 1.9  --  1.9 1.9 2.1  --   --   --   PHOS 3.0  --   --   --   --   --   --   --    < > = values in this interval not displayed.   Recent Labs    08/04/17 0529 08/04/2017 0436 08/14/17 0726  AST 16 18 26   ALT 9* 9* 26  ALKPHOS 57 64 59  BILITOT 0.5 0.5 0.7  PROT 6.8 7.4 6.5  ALBUMIN 3.1* 3.4* 3.2*   Recent Labs    08/03/17 0649 08/11/2017 0436 08/07/17 0744 08/18/17 0700  WBC 9.6 8.2 16.6* 26.1*  NEUTROABS 7.3 7.3  --  23.9*  HGB 11.3* 11.8* 11.3* 12.1  HCT 35.9* 37.6 36.1 37.1  MCV 97.6 97.7 97.0 95.9  PLT 267 289 312 145*   Lab Results  Component Value Date   TSH 3.140 08/02/2017   Lab Results  Component Value Date   HGBA1C 5.3 06/12/2017   Lab Results  Component Value Date   CHOL 147 05/09/2016   HDL 36 (L) 05/09/2016   LDLCALC 92 05/09/2016   TRIG 95 05/09/2016   CHOLHDL 4.1 05/09/2016    Significant Diagnostic Results in last 30 days:  Dg Tibia/fibula Right  Result Date: 07/22/2017 CLINICAL DATA:  Fall in shower with right leg pain. Initial encounter. EXAM: RIGHT TIBIA AND FIBULA - 2 VIEW COMPARISON:  12/04/2011 FINDINGS: Sizable knee joint effusion that was also seen on the 2018 knee film. Remote bimalleolar fractures with completed healing. No acute fracture or malalignment is seen. Nonspecific soft tissue swelling about the ankle. IMPRESSION: 1. No acute finding. 2. Knee joint effusion also seen on 02/19/2017 radiograph. Electronically Signed   By: Monte Fantasia M.D.   On: 07/22/2017 13:53   Dg Ankle Complete Left  Result Date: 08/02/2017 CLINICAL DATA:  Fall in bathroom with left ankle pain. EXAM: LEFT ANKLE COMPLETE - 3+ VIEW COMPARISON:  None. FINDINGS: Mild soft tissue swelling over the ankle. Ankle mortise is within normal. No evidence of acute fracture or dislocation. IMPRESSION: No  acute fracture. Electronically Signed  By: Marin Olp M.D.   On: 08/02/2017 08:47   Dg Ankle Complete Right  Result Date: 08/02/2017 CLINICAL DATA:  Fall in bathroom with right ankle pain. EXAM: RIGHT ANKLE - COMPLETE 3+ VIEW COMPARISON:  07/22/2017 FINDINGS: Examination demonstrates minimal soft tissue swelling over the ankle. No evidence of acute fracture or dislocation. Old healed distal fibular and medial malleolar fractures. Slight stable widening of the medial aspect of the ankle mortise. Small inferior calcaneal spur. IMPRESSION: No acute fracture. Old bimalleolar fractures and stable widening of the medial aspect of the ankle mortise. Electronically Signed   By: Marin Olp M.D.   On: 08/02/2017 08:46   Dg Ankle Complete Right  Result Date: 07/22/2017 CLINICAL DATA:  Fall in shower with right leg pain. Initial encounter. EXAM: RIGHT ANKLE - COMPLETE 3+ VIEW COMPARISON:  03/16/2012 FINDINGS: Soft tissue swelling. Remote medial and lateral malleolus fractures that are healed. Degenerative spurring at the ankle joint. Negative for joint effusion. Heel spur. Osteopenia. IMPRESSION: 1. Soft tissue swelling without acute osseous finding. 2. Remote and healed bimalleolar fractures. Electronically Signed   By: Monte Fantasia M.D.   On: 07/22/2017 13:49   Ct Head Wo Contrast  Result Date: 08/02/2017 CLINICAL DATA:  Fall.  Hit left side of head. EXAM: CT HEAD WITHOUT CONTRAST CT CERVICAL SPINE WITHOUT CONTRAST TECHNIQUE: Multidetector CT imaging of the head and cervical spine was performed following the standard protocol without intravenous contrast. Multiplanar CT image reconstructions of the cervical spine were also generated. COMPARISON:  CT head dated December 04, 2011. FINDINGS: CT HEAD FINDINGS Brain: There is new hypodensity within the right parietal lobe surrounding a questionable mass lesion (series 4, image 52; series 5, image 25). There is mild effacement of the adjacent sulci. No  evidence of acute infarction, hemorrhage, hydrocephalus, or extra-axial collection. Stable mild cerebral atrophy and chronic microvascular ischemic changes. Vascular: Atherosclerotic vascular calcification of the carotid siphons. No hyperdense vessel. Skull: Negative for fracture or focal lesion. Sinuses/Orbits: No acute finding. Other: None. CT CERVICAL SPINE FINDINGS Alignment: Normal. Skull base and vertebrae: No acute fracture. No primary bone lesion or focal pathologic process. Soft tissues and spinal canal: No prevertebral fluid or swelling. No visible canal hematoma. Disc levels: Mild disc height loss from C4-C5 through C6-C7. Moderate uncovertebral hypertrophy at C5-C6 and C6-C7. Severe facet arthropathy throughout the cervical spine with fusion of the right C4-C5 and left C3-C4 facet joints. Upper chest: Negative. Other: None. IMPRESSION: 1. New questionable mass lesion in the right parietal lobe with surrounding vasogenic edema. Recommend contrast-enhanced MRI of the brain for further evaluation. 2. No evidence of traumatic injury within the head or cervical spine. Electronically Signed   By: Titus Dubin M.D.   On: 08/02/2017 08:29   Ct Chest W Contrast  Result Date: 08/02/2017 CLINICAL DATA:  Initial workup due to presumed CNS metastasis. EXAM: CT CHEST, ABDOMEN, AND PELVIS WITH CONTRAST TECHNIQUE: Multidetector CT imaging of the chest, abdomen and pelvis was performed following the standard protocol during bolus administration of intravenous contrast. CONTRAST:  75 cc ISOVUE-300 IOPAMIDOL (ISOVUE-300) INJECTION 61%, 87mL ISOVUE-300 IOPAMIDOL (ISOVUE-300) INJECTION 61% COMPARISON:  08/02/2017 head CT, chest radiograph 05/28/2017 FINDINGS: CT CHEST FINDINGS Cardiovascular: Retroesophageal course of the internal carotid arteries bilaterally. Conventional branch pattern of the great vessels without significant stenosis. Ectatic ascending thoracic aorta to 3.7 cm. No thoracic aortic dissection.  Minimal coronary arteriosclerosis along left main and proximal LAD. No acute pulmonary embolus to the segmental level. Heart size is borderline  enlarged without pericardial effusion. Mediastinum/Nodes: Thyroid goiter with coarse calcifications within the left lobe. Subcentimeter cystic nodules are identified the lower poles of both lobes. Patent midline trachea and mainstem bronchi. No mediastinal hilar lymphadenopathy. Lungs/Pleura: No dominant mass or pulmonary consolidation. Multilobar bilateral subpleural areas of atelectasis and/or scarring. Mild peribronchial thickening within both lower lobes with subpleural areas of interstitial prominence and/or fibrosis in the lower lobes bilaterally. Small paraseptal foci of emphysema along the periphery of the right middle and both lower lobes. No effusion or pneumothorax. Musculoskeletal: Subchondral cystic change of both humeral heads and glenoid fossae. No aggressive osteolytic or blastic disease. Degenerative disc disease T7 through T10. CT ABDOMEN PELVIS FINDINGS Hepatobiliary: The liver enhances homogeneously. The gallbladder is physiologically distended without calculus. No biliary dilatation noted. Pancreas: No enhancing pancreatic mass or ductal dilatation. No inflammation. There appears to be mild side branch ductal ectasia of the pancreatic tail. Tiny side-branch intraductal papillary mucinous neoplasms are not entirely excluded, series 2/54. Spleen: Normal size.  No enhancing mass. Adrenals/Urinary Tract: Normal bilateral adrenal glands. 2.2 cm upper pole right renal cyst, simple in appearance with smaller subcentimeter hypodense lesions of both kidneys statistically consistent with cysts in the interpolar aspect of both kidneys and right lower pole. No nephrolithiasis, enhancing renal mass nor obstructive uropathy. The urinary bladder demonstrates no calculus or focal mural thickening. No focal mass. Stomach/Bowel: Small hiatal hernia. Decompressed stomach.  Normal small bowel rotation is noted. No small-bowel dilatation or mass. No inflammation. The distal terminal ileum are unremarkable. No findings of acute appendicitis. Scattered colonic diverticulosis without acute diverticulitis. No annular constricting lesions. Moderate fecal retention within the descending and sigmoid colon. Vascular/Lymphatic: Mild-to-moderate aortic atherosclerosis. No aneurysm or adenopathy. Reproductive: Hysterectomy.  No adnexal mass. Other: No free air nor free fluid. Musculoskeletal: Degenerative disc disease L4-5. No aggressive osteolytic or blastic disease. Multilevel degenerative facet arthropathy of the lumbar spine. Sclerosis about the pubic symphysis and both SI joints. Lumbosacral transitional vertebral anatomy with pseudoarticulation of L5 with S1 on the left. IMPRESSION: Chest CT: 1. Multinodular goiter. 2. Coronary arteriosclerosis and aortic atherosclerosis. No aneurysm or acute pulmonary embolus. 3. No pulmonary lesions. 4. Minimal subpleural areas of bilateral atelectasis and/or fibrosis. CT AP: 1. Tiny cystic foci in the pancreatic tail may represent ectatic pancreatic duct side branches. Side branch IPMN might also be a possibility. 2. Bilateral renal cysts, the largest measuring 2.2 cm in the upper pole. The remainder are too small to further characterize in the interpolar aspect of both kidneys and right lower pole. 3. Scattered colonic diverticulosis without acute diverticulitis. Masslike abnormality identified. Electronically Signed   By: Ashley Royalty M.D.   On: 08/02/2017 19:16   Ct Cervical Spine Wo Contrast  Result Date: 08/02/2017 CLINICAL DATA:  Fall.  Hit left side of head. EXAM: CT HEAD WITHOUT CONTRAST CT CERVICAL SPINE WITHOUT CONTRAST TECHNIQUE: Multidetector CT imaging of the head and cervical spine was performed following the standard protocol without intravenous contrast. Multiplanar CT image reconstructions of the cervical spine were also generated.  COMPARISON:  CT head dated December 04, 2011. FINDINGS: CT HEAD FINDINGS Brain: There is new hypodensity within the right parietal lobe surrounding a questionable mass lesion (series 4, image 52; series 5, image 25). There is mild effacement of the adjacent sulci. No evidence of acute infarction, hemorrhage, hydrocephalus, or extra-axial collection. Stable mild cerebral atrophy and chronic microvascular ischemic changes. Vascular: Atherosclerotic vascular calcification of the carotid siphons. No hyperdense vessel. Skull: Negative for fracture or focal  lesion. Sinuses/Orbits: No acute finding. Other: None. CT CERVICAL SPINE FINDINGS Alignment: Normal. Skull base and vertebrae: No acute fracture. No primary bone lesion or focal pathologic process. Soft tissues and spinal canal: No prevertebral fluid or swelling. No visible canal hematoma. Disc levels: Mild disc height loss from C4-C5 through C6-C7. Moderate uncovertebral hypertrophy at C5-C6 and C6-C7. Severe facet arthropathy throughout the cervical spine with fusion of the right C4-C5 and left C3-C4 facet joints. Upper chest: Negative. Other: None. IMPRESSION: 1. New questionable mass lesion in the right parietal lobe with surrounding vasogenic edema. Recommend contrast-enhanced MRI of the brain for further evaluation. 2. No evidence of traumatic injury within the head or cervical spine. Electronically Signed   By: Titus Dubin M.D.   On: 08/02/2017 08:29   Mr Jodene Nam Head Wo Contrast  Result Date: 08/04/2017 CLINICAL DATA:  Recent fall. Seizure. Abnormal neuro exam. Abnormal CT of the head. EXAM: MRI HEAD WITHOUT CONTRAST MRA HEAD WITHOUT CONTRAST TECHNIQUE: Multiplanar, multiecho pulse sequences of the brain and surrounding structures were obtained without intravenous contrast. Angiographic images of the head were obtained using MRA technique without contrast. COMPARISON:  CT head without contrast 08/02/2017. FINDINGS: MRI HEAD FINDINGS Brain: A mass lesion in  the medial right parietal lobe measures at least 2.9 x 2.0 x 3.7 cm. There is significant surrounding vasogenic edema. This effaces the sulci and has some mass effect on the right lateral ventricle. There is no hydrocephalus. Restricted diffusion is evident within the lesion and scattered throughout the adjacent white matter. Additional cortical restricted diffusion is present separate from the measured mass lesion. Abnormal T2 signal extends into the splenium of the corpus callosum. There is additional signal surrounding the atrium of the right lateral ventricle. No acute hemorrhage is present. Mild periventricular white matter changes are present otherwise. No significant extra-axial fluid collection is present. The internal auditory canals are within normal limits bilaterally. The brainstem is normal. Remote lacunar infarcts are present in the inferior cerebellum bilaterally. No acute abnormality is present. Vascular: Flow is present in the major intracranial arteries. Skull and upper cervical spine: The skull base is within normal limits. The craniocervical junction is normal. Sinuses/Orbits: The paranasal sinuses and mastoid air cells are clear. MRA HEAD FINDINGS Atherosclerotic irregularity is present within the cavernous internal carotid arteries bilaterally. There is mild narrowing of approximately 50% at the ophthalmic segment on the right. ICA termini are intact. A moderate proximal right A1 segment stenosis is present. A moderate proximal left M1 segment stenosis is present. The anterior communicating artery is patent. There is asymmetric attenuation of the distal right M1 segment and right MCA branch vessels compared to the left. The vertebral arteries are codominant. PICA origins are not visualized. Vertebrobasilar junction is normal. Both posterior cerebral arteries originate from basilar tip. There is signal loss in the PCA branches bilaterally. IMPRESSION: 1. Infiltrative mass lesion in the medial  right parietal lobe measures at least 2.9 x 2.0 x 3.7 cm. 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 MRI with contrast could be used for further evaluation if clinically indicated. T2 signal changes separate from the measured lesion likely represent infiltrative disease is well. 2. Mass effect from the primary lesion and surrounding vasogenic edema without hydrocephalus or downward herniation. 3. Mild stenosis involving the ophthalmic segment of the right internal carotid artery. 4. Moderate right A1 and left M1 proximal stenoses. 5. Asymmetric attenuation of  the distal right M1 segment and right MCA branch vessels compared to the left. Electronically Signed   By: San Morelle M.D.   On: 08/04/2017 08:57   Mr Brain Wo Contrast  Result Date: 08/04/2017 CLINICAL DATA:  Recent fall. Seizure. Abnormal neuro exam. Abnormal CT of the head. EXAM: MRI HEAD WITHOUT CONTRAST MRA HEAD WITHOUT CONTRAST TECHNIQUE: Multiplanar, multiecho pulse sequences of the brain and surrounding structures were obtained without intravenous contrast. Angiographic images of the head were obtained using MRA technique without contrast. COMPARISON:  CT head without contrast 08/02/2017. FINDINGS: MRI HEAD FINDINGS Brain: A mass lesion in the medial right parietal lobe measures at least 2.9 x 2.0 x 3.7 cm. There is significant surrounding vasogenic edema. This effaces the sulci and has some mass effect on the right lateral ventricle. There is no hydrocephalus. Restricted diffusion is evident within the lesion and scattered throughout the adjacent white matter. Additional cortical restricted diffusion is present separate from the measured mass lesion. Abnormal T2 signal extends into the splenium of the corpus callosum. There is additional signal surrounding the atrium of the right lateral ventricle. No acute hemorrhage  is present. Mild periventricular white matter changes are present otherwise. No significant extra-axial fluid collection is present. The internal auditory canals are within normal limits bilaterally. The brainstem is normal. Remote lacunar infarcts are present in the inferior cerebellum bilaterally. No acute abnormality is present. Vascular: Flow is present in the major intracranial arteries. Skull and upper cervical spine: The skull base is within normal limits. The craniocervical junction is normal. Sinuses/Orbits: The paranasal sinuses and mastoid air cells are clear. MRA HEAD FINDINGS Atherosclerotic irregularity is present within the cavernous internal carotid arteries bilaterally. There is mild narrowing of approximately 50% at the ophthalmic segment on the right. ICA termini are intact. A moderate proximal right A1 segment stenosis is present. A moderate proximal left M1 segment stenosis is present. The anterior communicating artery is patent. There is asymmetric attenuation of the distal right M1 segment and right MCA branch vessels compared to the left. The vertebral arteries are codominant. PICA origins are not visualized. Vertebrobasilar junction is normal. Both posterior cerebral arteries originate from basilar tip. There is signal loss in the PCA branches bilaterally. IMPRESSION: 1. Infiltrative mass lesion in the medial right parietal lobe measures at least 2.9 x 2.0 x 3.7 cm. 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 MRI with contrast could be used for further evaluation if clinically indicated. T2 signal changes separate from the measured lesion likely represent infiltrative disease is well. 2. Mass effect from the primary lesion and surrounding vasogenic edema without hydrocephalus or downward herniation. 3. Mild stenosis involving the ophthalmic segment of the right internal  carotid artery. 4. Moderate right A1 and left M1 proximal stenoses. 5. Asymmetric attenuation of the distal right M1 segment and right MCA branch vessels compared to the left. Electronically Signed   By: San Morelle M.D.   On: 08/04/2017 08:57   Mr Brain W Contrast  Result Date: 08/04/2017 CLINICAL DATA:  Altered mental status for 2 days. Follow-up evaluation. EXAM: MRI HEAD WITH CONTRAST TECHNIQUE: Multiplanar, multiecho pulse sequences of the brain and surrounding structures were obtained with intravenous contrast. CONTRAST:  76mL MULTIHANCE GADOBENATE DIMEGLUMINE 529 MG/ML IV SOLN COMPARISON:  MRI of the head without contrast Aug 04, 2017 at 0825 hours FINDINGS: Brain: 3 x 3.4 x 5.4 cm heterogeneously enhancing lobulated  RIGHT mesial parietal lobe mass corresponding to previous abnormality. There 2 adjacent nodules within the splenium of the corpus callosum measuring to 18 x 8 mm. In addition, 4 mm nodular enhancement RIGHT inferior frontal cortex. No abnormal extra-axial enhancement. Vascular: Not tailored for evaluation. Skull and upper cervical spine: No abnormal osseous enhancement. Sinuses/Orbits: No abnormal enhancement. Other: None. IMPRESSION: 1. Four suspicious foci of intraparenchymal enhancement, dominant lesion RIGHT parietal lobe 3 x 3.4 x 5.4 cm. 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 Electronically Signed   By: Elon Alas M.D.   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 EXAM: CT CHEST, ABDOMEN, AND PELVIS WITH CONTRAST TECHNIQUE: Multidetector CT imaging of the chest, abdomen and pelvis was performed following the standard protocol during bolus administration of intravenous contrast. CONTRAST:  75 cc ISOVUE-300 IOPAMIDOL (ISOVUE-300) INJECTION 61%, 51mL ISOVUE-300 IOPAMIDOL (ISOVUE-300) INJECTION 61% COMPARISON:  08/02/2017 head CT, chest radiograph  05/28/2017 FINDINGS: CT CHEST FINDINGS Cardiovascular: Retroesophageal course of the internal carotid arteries bilaterally. Conventional branch pattern of the great vessels without significant stenosis. Ectatic ascending thoracic aorta to 3.7 cm. No thoracic aortic dissection. Minimal coronary arteriosclerosis along left main and proximal LAD. No acute pulmonary embolus to the segmental level. Heart size is borderline enlarged without pericardial effusion. Mediastinum/Nodes: Thyroid goiter with coarse calcifications within the left lobe. Subcentimeter cystic nodules are identified the lower poles of both lobes. Patent midline trachea and mainstem bronchi. No mediastinal hilar lymphadenopathy. Lungs/Pleura: No dominant mass or pulmonary consolidation. Multilobar bilateral subpleural areas of atelectasis and/or scarring. Mild peribronchial thickening within both lower lobes with subpleural areas of interstitial prominence and/or fibrosis in the lower lobes bilaterally. Small paraseptal foci of emphysema along the periphery of the right middle and both lower lobes. No effusion or pneumothorax. Musculoskeletal: Subchondral cystic change of both humeral heads and glenoid fossae. No aggressive osteolytic or blastic disease. Degenerative disc disease T7 through T10. CT ABDOMEN PELVIS FINDINGS Hepatobiliary: The liver enhances homogeneously. The gallbladder is physiologically distended without calculus. No biliary dilatation noted. Pancreas: No enhancing pancreatic mass or ductal dilatation. No inflammation. There appears to be mild side branch ductal ectasia of the pancreatic tail. Tiny side-branch intraductal papillary mucinous neoplasms are not entirely excluded, series 2/54. Spleen: Normal size.  No enhancing mass. Adrenals/Urinary Tract: Normal bilateral adrenal glands. 2.2 cm upper pole right renal cyst, simple in appearance with smaller subcentimeter hypodense lesions of both kidneys statistically consistent with  cysts in the interpolar aspect of both kidneys and right lower pole. No nephrolithiasis, enhancing renal mass nor obstructive uropathy. The urinary bladder demonstrates no calculus or focal mural thickening. No focal mass. Stomach/Bowel: Small hiatal hernia. Decompressed stomach. Normal small bowel rotation is noted. No small-bowel dilatation or mass. No inflammation. The distal terminal ileum are unremarkable. No findings of acute appendicitis. Scattered colonic diverticulosis without acute diverticulitis. No annular constricting lesions. Moderate fecal retention within the descending and sigmoid colon. Vascular/Lymphatic: Mild-to-moderate aortic atherosclerosis. No aneurysm or adenopathy. Reproductive: Hysterectomy.  No adnexal mass. Other: No free air nor free fluid. Musculoskeletal: Degenerative disc disease L4-5. No aggressive osteolytic or blastic disease. Multilevel degenerative facet arthropathy of the lumbar spine. Sclerosis about the pubic symphysis and both SI joints. Lumbosacral transitional vertebral anatomy with pseudoarticulation of L5 with S1 on the left. IMPRESSION: Chest CT: 1. Multinodular goiter. 2. Coronary arteriosclerosis and aortic atherosclerosis. No aneurysm or acute pulmonary embolus. 3. No pulmonary  lesions. 4. Minimal subpleural areas of bilateral atelectasis and/or fibrosis. CT AP: 1. Tiny cystic foci in the pancreatic tail may represent ectatic pancreatic duct side branches. Side branch IPMN might also be a possibility. 2. Bilateral renal cysts, the largest measuring 2.2 cm in the upper pole. The remainder are too small to further characterize in the interpolar aspect of both kidneys and right lower pole. 3. Scattered colonic diverticulosis without acute diverticulitis. Masslike abnormality identified. Electronically Signed   By: Ashley Royalty M.D.   On: 08/02/2017 19:16   Dg Knee Complete 4 Views Right  Result Date: 08/02/2017 CLINICAL DATA:  Fall in bathroom with right knee pain.  EXAM: RIGHT KNEE - COMPLETE 4+ VIEW COMPARISON:  None. FINDINGS: Mild diffuse osteopenia. Mild tricompartmental osteoarthritic change worse over the lateral compartment. No evidence of acute fracture or dislocation. Small joint effusion is present. IMPRESSION: No acute fracture. Small joint effusion. Mild tricompartmental osteoarthritis. Electronically Signed   By: Marin Olp M.D.   On: 08/02/2017 08:45   Dg Knee Complete 4 Views Right  Result Date: 07/22/2017 CLINICAL DATA:  Fall in shower with right leg pain. Initial encounter. EXAM: RIGHT KNEE - COMPLETE 4+ VIEW COMPARISON:  02/19/2017 FINDINGS: Sizable joint effusion that was also seen previously. No definite fat component. No fracture or malalignment, sclerosis below the lateral tibial plateau is stable. Generalized degenerative marginal spurring. Osteopenia. IMPRESSION: 1. Large joint effusion which was also seen on a 02/19/2017 study. No visible fracture. 2. Mild generalized degenerative spurring. Electronically Signed   By: Monte Fantasia M.D.   On: 07/22/2017 13:51   Dg Foot Complete Right  Result Date: 07/22/2017 CLINICAL DATA:  Fall in shower with right leg pain. Initial encounter. EXAM: RIGHT FOOT COMPLETE - 3+ VIEW COMPARISON:  None. FINDINGS: Soft tissue swelling at the ankle as described on dedicated imaging. Bones are osteopenic. No acute fracture or malalignment. Small heel spur. Hallux valgus with early toe crossing. Osteopenia. IMPRESSION: No acute osseous finding. Electronically Signed   By: Monte Fantasia M.D.   On: 07/22/2017 13:54   Dg Hip Unilat W Or Wo Pelvis 2-3 Views Left  Result Date: 08/02/2017 CLINICAL DATA:  Fall in bathroom with left-sided pain. EXAM: DG HIP (WITH OR WITHOUT PELVIS) 2-3V LEFT COMPARISON:  07/22/2017 FINDINGS: Mild diffuse osteopenia. Mild symmetric degenerative change of the hips. No evidence of acute fracture or dislocation. There are degenerative changes of the spine and sacroiliac joints. IMPRESSION:  No acute fracture. Electronically Signed   By: Marin Olp M.D.   On: 08/02/2017 08:49   Dg Hip Unilat With Pelvis 2-3 Views Left  Result Date: 07/22/2017 CLINICAL DATA:  Fall from sitting. EXAM: DG HIP (WITH OR WITHOUT PELVIS) 2-3V LEFT COMPARISON:  None. FINDINGS: There is no evidence of hip fracture or dislocation. There is no evidence of arthropathy or other focal bone abnormality. Sclerotic change of the pubic symphysis. IMPRESSION: Normal left hip radiographs for age. Electronically Signed   By: Ulyses Jarred M.D.   On: 07/22/2017 13:44    Assessment/Plan Lesion of parietal lobe of brain Concerning for metastatic disease/Primary GBM . Her CT scan of Chest and Abdomen has been negative so far. Per Patient Neurosurgery does not think she is candidate for surgery or any other Workup. D/W the patient at this time she is not planning to follow with Oncology Either. Plan for Family Meeting on wed with Education officer, museum. I Have D/W them about possible getting Hospice involve Leucocytosis Seems most likely due to Steroids.  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  She says she has not had any other problems Hypercalcemia Patient has a history of hypercalcemia. She was given 1 dose of Zometa in hospital  Hypothyroidism On Levothyroxine TSH normal in hospital.  Hyperparathyroidism With Hypercalcemia Follows with Dr Dorris Fetch  Essential hypertension BP controlled on Coreg and Amlodipine.  Constipation Had Abdominal Xray in facility which showed Moderate constipation Doing well on Miralax Diabetes Her A1C was 5.3 in 03/19 Not on Any hypoglycemic  Anxiety Doing well on low dose of Ativan Discharge Planning Patient is aware that she is going to need full assist. She has decided to stay in  facility for Now. Will D/W the Family about Hospice   Family/ staff Communication:   Labs/tests ordered:   Total time spent in this patient care encounter was _25 minutes; greater than 50% of the visit spent counseling patient, reviewing records , Labs and coordinating care for problems addressed at this encounter.

## 2017-08-20 ENCOUNTER — Non-Acute Institutional Stay (SKILLED_NURSING_FACILITY): Payer: Medicare Other | Admitting: Internal Medicine

## 2017-08-20 ENCOUNTER — Encounter: Payer: Self-pay | Admitting: Internal Medicine

## 2017-08-20 DIAGNOSIS — E1165 Type 2 diabetes mellitus with hyperglycemia: Secondary | ICD-10-CM

## 2017-08-20 DIAGNOSIS — F419 Anxiety disorder, unspecified: Secondary | ICD-10-CM

## 2017-08-20 DIAGNOSIS — G939 Disorder of brain, unspecified: Secondary | ICD-10-CM | POA: Diagnosis not present

## 2017-08-20 DIAGNOSIS — E875 Hyperkalemia: Secondary | ICD-10-CM

## 2017-08-20 DIAGNOSIS — IMO0002 Reserved for concepts with insufficient information to code with codable children: Secondary | ICD-10-CM

## 2017-08-20 DIAGNOSIS — E118 Type 2 diabetes mellitus with unspecified complications: Secondary | ICD-10-CM

## 2017-08-20 NOTE — Progress Notes (Signed)
Location:   [place] Room Number: 155/P Place of Service:  SNF (31) Provider:  Ewell Poe, MD  Patient Care Team: Kathyrn Drown, MD as PCP - General (Family Medicine)  Extended Emergency Contact Information Primary Emergency Contact: Marks,Cynthia Address: [address]          [place], [place] [address] [name] of [place] Phone: [phone] Mobile Phone: [phone] Relation: Spouse Secondary Emergency Contact: Marks,Cynthia Home Phone: [phone] Relation: Sister  Code Status:  DNR Goals of care: Advanced Directive information Advanced Directives 08/18/2017  Does Patient Have a Medical Advance Directive? Yes  Type of Advance Directive Out of facility DNR (pink MOST or yellow form)  Does patient want to make changes to medical advance directive? No - Patient declined  Copy of [place] in Chart? -  Would patient like information on creating a medical advance directive? No - Patient declined  Pre-existing out of facility DNR order (yellow form or pink MOST form) -    Chief complaint- acute visit secondary to concerns about status with history of brain lesion- anxiety    HPI:  Pt is a 84 y.o. female seen today for an acute visit for concerns about her status with history of brain lesion with associated anxiety.  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  She had had increased falls at home which precipitated the work-up.  Her calcium also was elevated and she was treated with a dose of Zometa  She was also started on Keppra for concerns that she was having seizures with nonvoluntary movements of her left  leg.  She subsequently is seeing neurosurgery and told that she was not a surgical candidate.  Today she is somewhat anxious about her status.  She does receive Ativan as needed at night but appears she she would benefit from some antianxiety medication during the day as well.  I did spend time reassuring her that she was not in this alone she had strong family support friends support and that we would be there for her as well.  She appeared to be reassured-but still believe she would most likely benefit from more routine dosing of an antianxiety medication.  She does remain on dexamethasone twice a day her dose was reduced by Dr. Lyndel Safe recently- white count was elevated earlier this week at around 26,000 this was thought to be steroid related she does not really show any signs of infection.  Update labs are pending.  She also had a mildly elevated potassium of 5.5-this could be hemolysis but will need to be rechecked apparently she is a difficult person to obtain labs from so we will try to do this tomorrow  Currently other than feeling anxious she has no complaints.  Vital signs appear to be stable blood pressure is somewhat elevated with systolic of 025 but again she is somewhat anxious earlier today blood pressure was 109/60   Past Medical History:  Diagnosis Date  . Arthritis    Knees  . Chronic back pain   . Chronic pain of right knee   . Diabetes mellitus   . Gait abnormality   . Hypertension   . Macular degeneration   .  Thyroid disease    Past Surgical History:  Procedure Laterality Date  . ABDOMINAL HYSTERECTOMY    . COLONOSCOPY    . SHOULDER SURGERY      Allergies  Allergen Reactions  . Aspirin   . Ibuprofen     Nausea or HTN  . Penicillins Rash    Has patient had a PCN reaction causing immediate rash, facial/tongue/throat swelling, SOB or lightheadedness with hypotension: unknown Has patient had a PCN reaction causing severe rash involving mucus membranes  or skin necrosis: unknown Has patient had a PCN reaction that required hospitalization: no Has patient had a PCN reaction occurring within the last 10 years: No If all of the above answers are "NO", then may proceed with Cephalosporin use.     Outpatient Encounter Medications as of 08/20/2017  Medication Sig  . acetaminophen (TYLENOL) 650 MG CR tablet Take 650 mg by mouth every 8 (eight) hours as needed for pain.   Marland Kitchen amLODipine (NORVASC) 2.5 MG tablet TAKE 1 TABLET BY MOUTH EVERY DAY  . carvedilol (COREG) 6.25 MG tablet Take 6.25 mg by mouth 2 (two) times daily with a meal.  . Cholecalciferol 5000 units capsule Take 5,000 Units by mouth daily.  Marland Kitchen dexamethasone (DECADRON) 6 MG tablet Take 3 mg by mouth 2 (two) times daily with a meal.  . levETIRAcetam (KEPPRA) 500 MG tablet Take 1 tablet (500 mg total) by mouth 2 (two) times daily.  Marland Kitchen levothyroxine (SYNTHROID, LEVOTHROID) 150 MCG tablet Take 2 tablets on mon, wed, and Friday. Take one and a half tablets on tues, thurs, sat, and sun  . LORazepam (ATIVAN) 0.5 MG tablet Take 0.5 tablets (0.25 mg total) by mouth at bedtime as needed for anxiety.  . Multiple Vitamins-Minerals (MULTI-VITAMIN/MINERALS PO) Take by mouth. Give 1 tablet by mouth once a day  . polyethylene glycol (MIRALAX / GLYCOLAX) packet Take 17 g by mouth daily.  . [DISCONTINUED] dexamethasone (DECADRON) 4 MG tablet Take 1 tablet (4 mg total) by mouth 2 (two) times daily with a meal.   No facility-administered encounter medications on file as of 08/20/2017.     Review of Systems   In general she is not complaining of any fever or chills   skin is not complaining of rashes or itchin    head ears eyes nose mouth throat is not complaining of any visual changes beyond baseline Does not complain of sore throat.  Story is not complaining of shortness of breath or cough.  Cardiac denies chest pain has some baseline lower extremity edema fairly mild.  GI does not complain of  abdominal discomfort nausea vomiting diarrhea constipation has somewhat of a spotty appetite per family.  Skeletal has some weakness but does not complain of joint pain.  Neurologic continues to deny any dizziness headaches  or syncope- recent evidence of seizures.  Psych again she does continue to have some anxiety appears somewhat reassured speaking with her --but suspect  this will continue to be an issue  Immunization History  Administered Date(s) Administered  . Influenza Split 11/30/2012  . Influenza,inj,Quad PF,6+ Mos 11/30/2014, 01/09/2017  . Influenza-Unspecified 01/17/2012, 12/21/2013  . Pneumococcal Conjugate-13 10/08/2013  . Pneumococcal Polysaccharide-23 10/24/2015  . Td 03/20/2011   Pertinent  Health Maintenance Due  Topic Date Due  . FOOT EXAM  09/06/2017 (Originally 03/12/2017)  . OPHTHALMOLOGY EXAM  09/06/2017 (Originally 08/01/2017)  . URINE MICROALBUMIN  09/06/2017 (Originally 09/07/2015)  . INFLUENZA VACCINE  10/16/2017  . HEMOGLOBIN A1C  12/13/2017  . DEXA  SCAN  Completed  . PNA vac Low Risk Adult  Completed   Fall Risk  06/24/2017 09/30/2016 06/05/2015 11/30/2014 07/06/2013  Falls in the past year? No No No No No  Risk for fall due to : Impaired balance/gait;Impaired mobility;History of fall(s);Medication side effect - - - -   Functional Status Survey:    Vitals:   08/20/17 1447  BP: (!) 162/90  Pulse: 70  Resp: 16  Temp: 97.9 F (36.6 C)  TempSrc: Oral  Blood pressure earlier today was 109/60  Physical Exam  In general this is a very pleasant elderly female in no distress sitting in her wheelchair she does appear mildly anxious.  Her skin is warm and dry.  Eyes she does have somewhat limited vision with history of macular degeneration at baseline.  Chest is clear to auscultation there is no labored breathing.  Heart is regular rate and rhythm without murmur gallop or rub she has mild lower extremity edema this does not appear to be increasing    Abdomen is soft nontender with positive bowel sounds  Musculoskeletal-moves all extremities at baseline with some baseline lower extremity weakness    neurologic appears grossly speech is clear no lateralizing findings  Psych she is alert and oriented pleasant somewhat anxious  Labs reviewed: Recent Labs    10/07/16 1119  08/03/17 0649 08/04/17 0529 08/01/2017 0436 08/07/17 0744 08/14/17 0726 08/18/17 0700  NA  --    < > 140 140 139 140 136 138  K  --    < > 4.4 4.5 4.5 4.4 4.9 5.5*  CL  --    < > 106 108 109 110 106 105  CO2  --    < > 24 24 24 23 22 27   GLUCOSE  --    < > 90 97 151* 123* 123* 122*  BUN  --    < > 25* 20 24* 34* 54* 56*  CREATININE  --    < > 0.97 0.91 0.93 0.93 0.83 0.80  CALCIUM 10.2   < > 10.2 9.6 9.8 9.4 9.5 10.0  MG 1.9  --  1.9 1.9 2.1  --   --   --   PHOS 3.0  --   --   --   --   --   --   --    < > = values in this interval not displayed.   Recent Labs    08/04/17 0529 07/30/2017 0436 08/14/17 0726  AST 16 18 26   ALT 9* 9* 26  ALKPHOS 57 64 59  BILITOT 0.5 0.5 0.7  PROT 6.8 7.4 6.5  ALBUMIN 3.1* 3.4* 3.2*   Recent Labs    08/03/17 0649 07/25/2017 0436 08/07/17 0744 08/18/17 0700  WBC 9.6 8.2 16.6* 26.1*  NEUTROABS 7.3 7.3  --  23.9*  HGB 11.3* 11.8* 11.3* 12.1  HCT 35.9* 37.6 36.1 37.1  MCV 97.6 97.7 97.0 95.9  PLT 267 289 312 145*   Lab Results  Component Value Date   TSH 3.140 08/02/2017   Lab Results  Component Value Date   HGBA1C 5.3 06/12/2017   Lab Results  Component Value Date   CHOL 147 05/09/2016   HDL 36 (L) 05/09/2016   LDLCALC 92 05/09/2016   TRIG 95 05/09/2016   CHOLHDL 4.1 05/09/2016    Significant Diagnostic Results in last 30 days:  Dg Tibia/fibula Right  Result Date: 07/22/2017 CLINICAL DATA:  Fall in shower with right leg pain. Initial encounter. EXAM: RIGHT  TIBIA AND FIBULA - 2 VIEW COMPARISON:  12/04/2011 FINDINGS: Sizable knee joint effusion that was also seen on the 2018 knee film. Remote  bimalleolar fractures with completed healing. No acute fracture or malalignment is seen. Nonspecific soft tissue swelling about the ankle. IMPRESSION: 1. No acute finding. 2. Knee joint effusion also seen on 02/19/2017 radiograph. Electronically Signed   By: Monte Fantasia M.D.   On: 07/22/2017 13:53   Dg Ankle Complete Left  Result Date: 08/02/2017 CLINICAL DATA:  Fall in bathroom with left ankle pain. EXAM: LEFT ANKLE COMPLETE - 3+ VIEW COMPARISON:  None. FINDINGS: Mild soft tissue swelling over the ankle. Ankle mortise is within normal. No evidence of acute fracture or dislocation. IMPRESSION: No acute fracture. Electronically Signed   By: Marin Olp M.D.   On: 08/02/2017 08:47   Dg Ankle Complete Right  Result Date: 08/02/2017 CLINICAL DATA:  Fall in bathroom with right ankle pain. EXAM: RIGHT ANKLE - COMPLETE 3+ VIEW COMPARISON:  07/22/2017 FINDINGS: Examination demonstrates minimal soft tissue swelling over the ankle. No evidence of acute fracture or dislocation. Old healed distal fibular and medial malleolar fractures. Slight stable widening of the medial aspect of the ankle mortise. Small inferior calcaneal spur. IMPRESSION: No acute fracture. Old bimalleolar fractures and stable widening of the medial aspect of the ankle mortise. Electronically Signed   By: Marin Olp M.D.   On: 08/02/2017 08:46   Dg Ankle Complete Right  Result Date: 07/22/2017 CLINICAL DATA:  Fall in shower with right leg pain. Initial encounter. EXAM: RIGHT ANKLE - COMPLETE 3+ VIEW COMPARISON:  03/16/2012 FINDINGS: Soft tissue swelling. Remote medial and lateral malleolus fractures that are healed. Degenerative spurring at the ankle joint. Negative for joint effusion. Heel spur. Osteopenia. IMPRESSION: 1. Soft tissue swelling without acute osseous finding. 2. Remote and healed bimalleolar fractures. Electronically Signed   By: Monte Fantasia M.D.   On: 07/22/2017 13:49   Ct Head Wo Contrast  Result Date:  08/02/2017 CLINICAL DATA:  Fall.  Hit left side of head. EXAM: CT HEAD WITHOUT CONTRAST CT CERVICAL SPINE WITHOUT CONTRAST TECHNIQUE: Multidetector CT imaging of the head and cervical spine was performed following the standard protocol without intravenous contrast. Multiplanar CT image reconstructions of the cervical spine were also generated. COMPARISON:  CT head dated December 04, 2011. FINDINGS: CT HEAD FINDINGS Brain: There is new hypodensity within the right parietal lobe surrounding a questionable mass lesion (series 4, image 52; series 5, image 25). There is mild effacement of the adjacent sulci. No evidence of acute infarction, hemorrhage, hydrocephalus, or extra-axial collection. Stable mild cerebral atrophy and chronic microvascular ischemic changes. Vascular: Atherosclerotic vascular calcification of the carotid siphons. No hyperdense vessel. Skull: Negative for fracture or focal lesion. Sinuses/Orbits: No acute finding. Other: None. CT CERVICAL SPINE FINDINGS Alignment: Normal. Skull base and vertebrae: No acute fracture. No primary bone lesion or focal pathologic process. Soft tissues and spinal canal: No prevertebral fluid or swelling. No visible canal hematoma. Disc levels: Mild disc height loss from C4-C5 through C6-C7. Moderate uncovertebral hypertrophy at C5-C6 and C6-C7. Severe facet arthropathy throughout the cervical spine with fusion of the right C4-C5 and left C3-C4 facet joints. Upper chest: Negative. Other: None. IMPRESSION: 1. New questionable mass lesion in the right parietal lobe with surrounding vasogenic edema. Recommend contrast-enhanced MRI of the brain for further evaluation. 2. No evidence of traumatic injury within the head or cervical spine. Electronically Signed   By: Titus Dubin M.D.   On: 08/02/2017  08:29   Ct Chest W Contrast  Result Date: 08/02/2017 CLINICAL DATA:  Initial workup due to presumed CNS metastasis. EXAM: CT CHEST, ABDOMEN, AND PELVIS WITH CONTRAST  TECHNIQUE: Multidetector CT imaging of the chest, abdomen and pelvis was performed following the standard protocol during bolus administration of intravenous contrast. CONTRAST:  75 cc ISOVUE-300 IOPAMIDOL (ISOVUE-300) INJECTION 61%, 81mL ISOVUE-300 IOPAMIDOL (ISOVUE-300) INJECTION 61% COMPARISON:  08/02/2017 head CT, chest radiograph 05/28/2017 FINDINGS: CT CHEST FINDINGS Cardiovascular: Retroesophageal course of the internal carotid arteries bilaterally. Conventional branch pattern of the great vessels without significant stenosis. Ectatic ascending thoracic aorta to 3.7 cm. No thoracic aortic dissection. Minimal coronary arteriosclerosis along left main and proximal LAD. No acute pulmonary embolus to the segmental level. Heart size is borderline enlarged without pericardial effusion. Mediastinum/Nodes: Thyroid goiter with coarse calcifications within the left lobe. Subcentimeter cystic nodules are identified the lower poles of both lobes. Patent midline trachea and mainstem bronchi. No mediastinal hilar lymphadenopathy. Lungs/Pleura: No dominant mass or pulmonary consolidation. Multilobar bilateral subpleural areas of atelectasis and/or scarring. Mild peribronchial thickening within both lower lobes with subpleural areas of interstitial prominence and/or fibrosis in the lower lobes bilaterally. Small paraseptal foci of emphysema along the periphery of the right middle and both lower lobes. No effusion or pneumothorax. Musculoskeletal: Subchondral cystic change of both humeral heads and glenoid fossae. No aggressive osteolytic or blastic disease. Degenerative disc disease T7 through T10. CT ABDOMEN PELVIS FINDINGS Hepatobiliary: The liver enhances homogeneously. The gallbladder is physiologically distended without calculus. No biliary dilatation noted. Pancreas: No enhancing pancreatic mass or ductal dilatation. No inflammation. There appears to be mild side branch ductal ectasia of the pancreatic tail. Tiny  side-branch intraductal papillary mucinous neoplasms are not entirely excluded, series 2/54. Spleen: Normal size.  No enhancing mass. Adrenals/Urinary Tract: Normal bilateral adrenal glands. 2.2 cm upper pole right renal cyst, simple in appearance with smaller subcentimeter hypodense lesions of both kidneys statistically consistent with cysts in the interpolar aspect of both kidneys and right lower pole. No nephrolithiasis, enhancing renal mass nor obstructive uropathy. The urinary bladder demonstrates no calculus or focal mural thickening. No focal mass. Stomach/Bowel: Small hiatal hernia. Decompressed stomach. Normal small bowel rotation is noted. No small-bowel dilatation or mass. No inflammation. The distal terminal ileum are unremarkable. No findings of acute appendicitis. Scattered colonic diverticulosis without acute diverticulitis. No annular constricting lesions. Moderate fecal retention within the descending and sigmoid colon. Vascular/Lymphatic: Mild-to-moderate aortic atherosclerosis. No aneurysm or adenopathy. Reproductive: Hysterectomy.  No adnexal mass. Other: No free air nor free fluid. Musculoskeletal: Degenerative disc disease L4-5. No aggressive osteolytic or blastic disease. Multilevel degenerative facet arthropathy of the lumbar spine. Sclerosis about the pubic symphysis and both SI joints. Lumbosacral transitional vertebral anatomy with pseudoarticulation of L5 with S1 on the left. IMPRESSION: Chest CT: 1. Multinodular goiter. 2. Coronary arteriosclerosis and aortic atherosclerosis. No aneurysm or acute pulmonary embolus. 3. No pulmonary lesions. 4. Minimal subpleural areas of bilateral atelectasis and/or fibrosis. CT AP: 1. Tiny cystic foci in the pancreatic tail may represent ectatic pancreatic duct side branches. Side branch IPMN might also be a possibility. 2. Bilateral renal cysts, the largest measuring 2.2 cm in the upper pole. The remainder are too small to further characterize in the  interpolar aspect of both kidneys and right lower pole. 3. Scattered colonic diverticulosis without acute diverticulitis. Masslike abnormality identified. Electronically Signed   By: Ashley Royalty M.D.   On: 08/02/2017 19:16   Ct Cervical Spine Wo Contrast  Result Date:  08/02/2017 CLINICAL DATA:  Fall.  Hit left side of head. EXAM: CT HEAD WITHOUT CONTRAST CT CERVICAL SPINE WITHOUT CONTRAST TECHNIQUE: Multidetector CT imaging of the head and cervical spine was performed following the standard protocol without intravenous contrast. Multiplanar CT image reconstructions of the cervical spine were also generated. COMPARISON:  CT head dated December 04, 2011. FINDINGS: CT HEAD FINDINGS Brain: There is new hypodensity within the right parietal lobe surrounding a questionable mass lesion (series 4, image 52; series 5, image 25). There is mild effacement of the adjacent sulci. No evidence of acute infarction, hemorrhage, hydrocephalus, or extra-axial collection. Stable mild cerebral atrophy and chronic microvascular ischemic changes. Vascular: Atherosclerotic vascular calcification of the carotid siphons. No hyperdense vessel. Skull: Negative for fracture or focal lesion. Sinuses/Orbits: No acute finding. Other: None. CT CERVICAL SPINE FINDINGS Alignment: Normal. Skull base and vertebrae: No acute fracture. No primary bone lesion or focal pathologic process. Soft tissues and spinal canal: No prevertebral fluid or swelling. No visible canal hematoma. Disc levels: Mild disc height loss from C4-C5 through C6-C7. Moderate uncovertebral hypertrophy at C5-C6 and C6-C7. Severe facet arthropathy throughout the cervical spine with fusion of the right C4-C5 and left C3-C4 facet joints. Upper chest: Negative. Other: None. IMPRESSION: 1. New questionable mass lesion in the right parietal lobe with surrounding vasogenic edema. Recommend contrast-enhanced MRI of the brain for further evaluation. 2. No evidence of traumatic injury  within the head or cervical spine. Electronically Signed   By: Titus Dubin M.D.   On: 08/02/2017 08:29   Mr Jodene Nam Head Wo Contrast  Result Date: 08/04/2017 CLINICAL DATA:  Recent fall. Seizure. Abnormal neuro exam. Abnormal CT of the head. EXAM: MRI HEAD WITHOUT CONTRAST MRA HEAD WITHOUT CONTRAST TECHNIQUE: Multiplanar, multiecho pulse sequences of the brain and surrounding structures were obtained without intravenous contrast. Angiographic images of the head were obtained using MRA technique without contrast. COMPARISON:  CT head without contrast 08/02/2017. FINDINGS: MRI HEAD FINDINGS Brain: A mass lesion in the medial right parietal lobe measures at least 2.9 x 2.0 x 3.7 cm. There is significant surrounding vasogenic edema. This effaces the sulci and has some mass effect on the right lateral ventricle. There is no hydrocephalus. Restricted diffusion is evident within the lesion and scattered throughout the adjacent white matter. Additional cortical restricted diffusion is present separate from the measured mass lesion. Abnormal T2 signal extends into the splenium of the corpus callosum. There is additional signal surrounding the atrium of the right lateral ventricle. No acute hemorrhage is present. Mild periventricular white matter changes are present otherwise. No significant extra-axial fluid collection is present. The internal auditory canals are within normal limits bilaterally. The brainstem is normal. Remote lacunar infarcts are present in the inferior cerebellum bilaterally. No acute abnormality is present. Vascular: Flow is present in the major intracranial arteries. Skull and upper cervical spine: The skull base is within normal limits. The craniocervical junction is normal. Sinuses/Orbits: The paranasal sinuses and mastoid air cells are clear. MRA HEAD FINDINGS Atherosclerotic irregularity is present within the cavernous internal carotid arteries bilaterally. There is mild narrowing of  approximately 50% at the ophthalmic segment on the right. ICA termini are intact. A moderate proximal right A1 segment stenosis is present. A moderate proximal left M1 segment stenosis is present. The anterior communicating artery is patent. There is asymmetric attenuation of the distal right M1 segment and right MCA branch vessels compared to the left. The vertebral arteries are codominant. PICA origins are not visualized. Vertebrobasilar junction  is normal. Both posterior cerebral arteries originate from basilar tip. There is signal loss in the PCA branches bilaterally. IMPRESSION: 1. Infiltrative mass lesion in the medial right parietal lobe measures at least 2.9 x 2.0 x 3.7 cm. 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 MRI with contrast could be used for further evaluation if clinically indicated. T2 signal changes separate from the measured lesion likely represent infiltrative disease is well. 2. Mass effect from the primary lesion and surrounding vasogenic edema without hydrocephalus or downward herniation. 3. Mild stenosis involving the ophthalmic segment of the right internal carotid artery. 4. Moderate right A1 and left M1 proximal stenoses. 5. Asymmetric attenuation of the distal right M1 segment and right MCA branch vessels compared to the left. Electronically Signed   By: San Morelle M.D.   On: 08/04/2017 08:57   Mr Brain Wo Contrast  Result Date: 08/04/2017 CLINICAL DATA:  Recent fall. Seizure. Abnormal neuro exam. Abnormal CT of the head. EXAM: MRI HEAD WITHOUT CONTRAST MRA HEAD WITHOUT CONTRAST TECHNIQUE: Multiplanar, multiecho pulse sequences of the brain and surrounding structures were obtained without intravenous contrast. Angiographic images of the head were obtained using MRA technique without contrast. COMPARISON:  CT head without contrast 08/02/2017. FINDINGS:  MRI HEAD FINDINGS Brain: A mass lesion in the medial right parietal lobe measures at least 2.9 x 2.0 x 3.7 cm. There is significant surrounding vasogenic edema. This effaces the sulci and has some mass effect on the right lateral ventricle. There is no hydrocephalus. Restricted diffusion is evident within the lesion and scattered throughout the adjacent white matter. Additional cortical restricted diffusion is present separate from the measured mass lesion. Abnormal T2 signal extends into the splenium of the corpus callosum. There is additional signal surrounding the atrium of the right lateral ventricle. No acute hemorrhage is present. Mild periventricular white matter changes are present otherwise. No significant extra-axial fluid collection is present. The internal auditory canals are within normal limits bilaterally. The brainstem is normal. Remote lacunar infarcts are present in the inferior cerebellum bilaterally. No acute abnormality is present. Vascular: Flow is present in the major intracranial arteries. Skull and upper cervical spine: The skull base is within normal limits. The craniocervical junction is normal. Sinuses/Orbits: The paranasal sinuses and mastoid air cells are clear. MRA HEAD FINDINGS Atherosclerotic irregularity is present within the cavernous internal carotid arteries bilaterally. There is mild narrowing of approximately 50% at the ophthalmic segment on the right. ICA termini are intact. A moderate proximal right A1 segment stenosis is present. A moderate proximal left M1 segment stenosis is present. The anterior communicating artery is patent. There is asymmetric attenuation of the distal right M1 segment and right MCA branch vessels compared to the left. The vertebral arteries are codominant. PICA origins are not visualized. Vertebrobasilar junction is normal. Both posterior cerebral arteries originate from basilar tip. There is signal loss in the PCA branches bilaterally. IMPRESSION: 1.  Infiltrative mass lesion in the medial right parietal lobe measures at least 2.9 x 2.0 x 3.7 cm. 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 MRI with contrast could be used for further evaluation if clinically indicated. T2 signal changes separate from the measured lesion likely represent infiltrative disease is well. 2. Mass effect from the primary lesion and surrounding vasogenic edema without hydrocephalus or downward herniation. 3.  Mild stenosis involving the ophthalmic segment of the right internal carotid artery. 4. Moderate right A1 and left M1 proximal stenoses. 5. Asymmetric attenuation of the distal right M1 segment and right MCA branch vessels compared to the left. Electronically Signed   By: San Morelle M.D.   On: 08/04/2017 08:57   Mr Brain W Contrast  Result Date: 08/04/2017 CLINICAL DATA:  Altered mental status for 2 days. Follow-up evaluation. EXAM: MRI HEAD WITH CONTRAST TECHNIQUE: Multiplanar, multiecho pulse sequences of the brain and surrounding structures were obtained with intravenous contrast. CONTRAST:  69mL MULTIHANCE GADOBENATE DIMEGLUMINE 529 MG/ML IV SOLN COMPARISON:  MRI of the head without contrast Aug 04, 2017 at 0825 hours FINDINGS: Brain: 3 x 3.4 x 5.4 cm heterogeneously enhancing lobulated RIGHT mesial parietal lobe mass corresponding to previous abnormality. There 2 adjacent nodules within the splenium of the corpus callosum measuring to 18 x 8 mm. In addition, 4 mm nodular enhancement RIGHT inferior frontal cortex. No abnormal extra-axial enhancement. Vascular: Not tailored for evaluation. Skull and upper cervical spine: No abnormal osseous enhancement. Sinuses/Orbits: No abnormal enhancement. Other: None. IMPRESSION: 1. Four suspicious foci of intraparenchymal enhancement, dominant lesion RIGHT parietal lobe 3 x 3.4 x 5.4 cm. Constellation of  findings seen with multifocal GBM or metastasis. Electronically Signed   By: Elon Alas M.D.   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 EXAM: CT CHEST, ABDOMEN, AND PELVIS WITH CONTRAST TECHNIQUE: Multidetector CT imaging of the chest, abdomen and pelvis was performed following the standard protocol during bolus administration of intravenous contrast. CONTRAST:  75 cc ISOVUE-300 IOPAMIDOL (ISOVUE-300) INJECTION 61%, 75mL ISOVUE-300 IOPAMIDOL (ISOVUE-300) INJECTION 61% COMPARISON:  08/02/2017 head CT, chest radiograph 05/28/2017 FINDINGS: CT CHEST FINDINGS Cardiovascular: Retroesophageal course of the internal carotid arteries bilaterally. Conventional branch pattern of the great vessels without significant stenosis. Ectatic ascending thoracic aorta to 3.7 cm. No thoracic aortic dissection. Minimal coronary arteriosclerosis along left main and proximal LAD. No acute pulmonary embolus to the segmental level. Heart size is borderline enlarged without pericardial effusion. Mediastinum/Nodes: Thyroid goiter with coarse calcifications within the left lobe. Subcentimeter cystic nodules are identified the lower poles of both lobes. Patent midline trachea and mainstem bronchi. No mediastinal hilar lymphadenopathy. Lungs/Pleura: No dominant mass or pulmonary consolidation. Multilobar bilateral subpleural areas of atelectasis and/or scarring. Mild peribronchial thickening within both lower lobes with subpleural areas of interstitial prominence and/or fibrosis in the lower lobes bilaterally. Small paraseptal foci of emphysema along the periphery of the right middle and both lower lobes. No effusion or pneumothorax. Musculoskeletal: Subchondral cystic change of both humeral heads and glenoid fossae. No aggressive osteolytic or blastic disease. Degenerative disc disease T7 through T10. CT ABDOMEN PELVIS FINDINGS  Hepatobiliary: The liver enhances homogeneously. The gallbladder is physiologically distended without calculus. No biliary dilatation noted. Pancreas: No enhancing pancreatic mass or ductal dilatation. No inflammation. There appears to be mild side branch ductal ectasia of the pancreatic tail. Tiny side-branch intraductal papillary mucinous neoplasms are not entirely excluded, series 2/54. Spleen: Normal size.  No enhancing mass. Adrenals/Urinary Tract: Normal bilateral adrenal glands. 2.2 cm upper pole right renal cyst, simple in appearance with smaller subcentimeter hypodense lesions of both kidneys statistically consistent with cysts in the interpolar aspect of both kidneys and right lower pole. No nephrolithiasis, enhancing renal mass nor obstructive uropathy. The urinary bladder demonstrates no calculus or focal mural thickening. No focal mass. Stomach/Bowel: Small  hiatal hernia. Decompressed stomach. Normal small bowel rotation is noted. No small-bowel dilatation or mass. No inflammation. The distal terminal ileum are unremarkable. No findings of acute appendicitis. Scattered colonic diverticulosis without acute diverticulitis. No annular constricting lesions. Moderate fecal retention within the descending and sigmoid colon. Vascular/Lymphatic: Mild-to-moderate aortic atherosclerosis. No aneurysm or adenopathy. Reproductive: Hysterectomy.  No adnexal mass. Other: No free air nor free fluid. Musculoskeletal: Degenerative disc disease L4-5. No aggressive osteolytic or blastic disease. Multilevel degenerative facet arthropathy of the lumbar spine. Sclerosis about the pubic symphysis and both SI joints. Lumbosacral transitional vertebral anatomy with pseudoarticulation of L5 with S1 on the left. IMPRESSION: Chest CT: 1. Multinodular goiter. 2. Coronary arteriosclerosis and aortic atherosclerosis. No aneurysm or acute pulmonary embolus. 3. No pulmonary lesions. 4. Minimal subpleural areas of bilateral atelectasis  and/or fibrosis. CT AP: 1. Tiny cystic foci in the pancreatic tail may represent ectatic pancreatic duct side branches. Side branch IPMN might also be a possibility. 2. Bilateral renal cysts, the largest measuring 2.2 cm in the upper pole. The remainder are too small to further characterize in the interpolar aspect of both kidneys and right lower pole. 3. Scattered colonic diverticulosis without acute diverticulitis. Masslike abnormality identified. Electronically Signed   By: Ashley Royalty M.D.   On: 08/02/2017 19:16   Dg Knee Complete 4 Views Right  Result Date: 08/02/2017 CLINICAL DATA:  Fall in bathroom with right knee pain. EXAM: RIGHT KNEE - COMPLETE 4+ VIEW COMPARISON:  None. FINDINGS: Mild diffuse osteopenia. Mild tricompartmental osteoarthritic change worse over the lateral compartment. No evidence of acute fracture or dislocation. Small joint effusion is present. IMPRESSION: No acute fracture. Small joint effusion. Mild tricompartmental osteoarthritis. Electronically Signed   By: Marin Olp M.D.   On: 08/02/2017 08:45   Dg Knee Complete 4 Views Right  Result Date: 07/22/2017 CLINICAL DATA:  Fall in shower with right leg pain. Initial encounter. EXAM: RIGHT KNEE - COMPLETE 4+ VIEW COMPARISON:  02/19/2017 FINDINGS: Sizable joint effusion that was also seen previously. No definite fat component. No fracture or malalignment, sclerosis below the lateral tibial plateau is stable. Generalized degenerative marginal spurring. Osteopenia. IMPRESSION: 1. Large joint effusion which was also seen on a 02/19/2017 study. No visible fracture. 2. Mild generalized degenerative spurring. Electronically Signed   By: Monte Fantasia M.D.   On: 07/22/2017 13:51   Dg Foot Complete Right  Result Date: 07/22/2017 CLINICAL DATA:  Fall in shower with right leg pain. Initial encounter. EXAM: RIGHT FOOT COMPLETE - 3+ VIEW COMPARISON:  None. FINDINGS: Soft tissue swelling at the ankle as described on dedicated imaging. Bones  are osteopenic. No acute fracture or malalignment. Small heel spur. Hallux valgus with early toe crossing. Osteopenia. IMPRESSION: No acute osseous finding. Electronically Signed   By: Monte Fantasia M.D.   On: 07/22/2017 13:54   Dg Hip Unilat W Or Wo Pelvis 2-3 Views Left  Result Date: 08/02/2017 CLINICAL DATA:  Fall in bathroom with left-sided pain. EXAM: DG HIP (WITH OR WITHOUT PELVIS) 2-3V LEFT COMPARISON:  07/22/2017 FINDINGS: Mild diffuse osteopenia. Mild symmetric degenerative change of the hips. No evidence of acute fracture or dislocation. There are degenerative changes of the spine and sacroiliac joints. IMPRESSION: No acute fracture. Electronically Signed   By: Marin Olp M.D.   On: 08/02/2017 08:49   Dg Hip Unilat With Pelvis 2-3 Views Left  Result Date: 07/22/2017 CLINICAL DATA:  Fall from sitting. EXAM: DG HIP (WITH OR WITHOUT PELVIS) 2-3V LEFT COMPARISON:  None. FINDINGS:  There is no evidence of hip fracture or dislocation. There is no evidence of arthropathy or other focal bone abnormality. Sclerotic change of the pubic symphysis. IMPRESSION: Normal left hip radiographs for age. Electronically Signed   By: Ulyses Jarred M.D.   On: 07/22/2017 13:44    Assessment/Plan  #1- history of brain lesion- she has consulted with surgery and thought not to be a surgical candidate-she is on dexamethasone dose was recently reduced- we did reassure her today that she was not in this alone-she appeared to be reassured-however I suspect she will continue at times to have anxiety- nursing staff feels she would benefit from routine dosing-at this point will increase Ativan to twice daily routine-hold for any sedation or respiratory depression.  2.  History of leukocytosis update labs are pending she does not really show any signs of infection fever chills shortness of breath cough dysuria-I suspect this is steroid related will await updated labs scheduled for Monday, January 10.  3- history of type  2 diabetes this is diet-controlled and appears to be stable with blood sugars largely in the lower mid 100s.  4.  History of hyperkalemia-this could be hemolysis lab variation renal function appears to be relatively stable- we will try to update a BMP tomorrow-again getting a lab today would be difficult since patient is a difficult person to draw labs from she has expressed minimal lab draws secondary to comfort.  5.-  Hypertension she is on Coreg as well as Norvasc I suspect her elevated systolic today is more anxiety related will continue to monitor  [id number]-of note greater than 25 minutes spent assessing patient-reviewing her chart and labs- discussing her status with nursing staff- coordinating and formulating a plan of care for numerous diagnoses- of note greater than 50% of time spent coordinating a plan of care

## 2017-08-21 ENCOUNTER — Non-Acute Institutional Stay (SKILLED_NURSING_FACILITY): Payer: Medicare Other | Admitting: Internal Medicine

## 2017-08-21 ENCOUNTER — Encounter (HOSPITAL_COMMUNITY)
Admission: RE | Admit: 2017-08-21 | Discharge: 2017-08-21 | Disposition: A | Discharge status: Home / Self Care | Payer: Medicare Other | Source: Skilled Nursing Facility | Attending: Internal Medicine | Admitting: Internal Medicine

## 2017-08-21 ENCOUNTER — Encounter: Payer: Self-pay | Admitting: Internal Medicine

## 2017-08-21 ENCOUNTER — Telehealth: Payer: Self-pay | Admitting: *Deleted

## 2017-08-21 DIAGNOSIS — F419 Anxiety disorder, unspecified: Secondary | ICD-10-CM | POA: Diagnosis not present

## 2017-08-21 DIAGNOSIS — E875 Hyperkalemia: Secondary | ICD-10-CM | POA: Diagnosis not present

## 2017-08-21 DIAGNOSIS — E213 Hyperparathyroidism, unspecified: Secondary | ICD-10-CM | POA: Insufficient documentation

## 2017-08-21 DIAGNOSIS — E038 Other specified hypothyroidism: Secondary | ICD-10-CM | POA: Insufficient documentation

## 2017-08-21 DIAGNOSIS — E119 Type 2 diabetes mellitus without complications: Secondary | ICD-10-CM | POA: Insufficient documentation

## 2017-08-21 DIAGNOSIS — C713 Malignant neoplasm of parietal lobe: Secondary | ICD-10-CM | POA: Insufficient documentation

## 2017-08-21 LAB — BASIC METABOLIC PANEL WITH GFR
Anion gap: 3 — ABNORMAL LOW (ref 5–15)
BUN: 51 mg/dL — ABNORMAL HIGH (ref 6–20)
CO2: 29 mmol/L (ref 22–32)
Calcium: 10.1 mg/dL (ref 8.9–10.3)
Chloride: 107 mmol/L (ref 101–111)
Creatinine, Ser: 0.83 mg/dL (ref 0.44–1.00)
GFR calc Af Amer: >60 mL/min
GFR calc non Af Amer: >60 mL/min
Glucose, Bld: 101 mg/dL — ABNORMAL HIGH (ref 65–99)
Potassium: 5.6 mmol/L — ABNORMAL HIGH (ref 3.5–5.1)
Sodium: 139 mmol/L (ref 135–145)

## 2017-08-21 NOTE — Telephone Encounter (Signed)
Patient family has some concerns about upcoming appt with Dr Mickeal Skinner.  [facility] states they don't want  Any further treatment.  Left message for sister to return call to discuss.

## 2017-08-21 NOTE — Progress Notes (Signed)
This is an acute visit.  Level care skilled.  Facility is [facility].  Chief complaint-acute visit secondary to hyperkalemia- follow-up anxiety   History of present illness.   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   It was noted the labs earlier this week her potassium was mildly elevated at 5.5- this was rechecked today to assess for accuracy and it does appear to be accurate with a potassium of 5.6.  She is not on an ACE inhibitor or potassium supplementation.  Her renal function appears stable with a creatinine of 0.83 and BUN of 51  She is not complaining of any chest pain or palpitations or syncope or significant pain.  She is sitting in her wheelchair comfortably visiting with her sister and husband   Past Medical History:  Diagnosis Date  . Arthritis    Knees  . Chronic back pain   . Chronic pain of right knee   . Diabetes mellitus   . Gait abnormality   . Hypertension   . Macular degeneration   . Thyroid disease         Past Surgical History:  Procedure Laterality Date  . ABDOMINAL HYSTERECTOMY    . COLONOSCOPY    . SHOULDER SURGERY           Allergies  Allergen Reactions  .  Aspirin   . Ibuprofen     Nausea or HTN  . Penicillins Rash    Has patient had a PCN reaction causing immediate rash, facial/tongue/throat swelling, SOB or lightheadedness with hypotension: unknown Has patient had a PCN reaction causing severe rash involving mucus membranes or skin necrosis: unknown Has patient had a PCN reaction that required hospitalization: no Has patient had a PCN reaction occurring within the last 10 years: No If all of the above answers are "NO", then may proceed with Cephalosporin use.          MEDICATIONS  Medication Sig  . acetaminophen (TYLENOL) 650 MG CR tablet Take 650 mg by mouth every 8 (eight) hours as needed for pain.   Marland Kitchen amLODipine (NORVASC) 2.5 MG tablet TAKE 1 TABLET BY MOUTH EVERY DAY  . carvedilol (COREG) 6.25 MG tablet Take 6.25 mg by mouth 2 (two) times daily with a meal.  . Cholecalciferol 5000 units capsule Take 5,000 Units by mouth daily.  Marland Kitchen dexamethasone (DECADRON) 6 MG tablet Take 3 mg by mouth 2 (two) times daily with a meal.  . levETIRAcetam (KEPPRA) 500 MG tablet Take 1 tablet (500 mg total) by mouth 2 (two) times daily.  Marland Kitchen levothyroxine (SYNTHROID, LEVOTHROID) 150 MCG tablet Take 2 tablets on mon, wed, and Friday. Take one and a half tablets on tues, thurs, sat, and sun  . LORazepam (ATIVAN) 0.5 MG  tablet Take 0.5 tablets (0.25 mg total) BID  . Multiple Vitamins-Minerals (MULTI-VITAMIN/MINERALS PO) Take by mouth. Give 1 tablet by mouth once a day  . polyethylene glycol (MIRALAX / GLYCOLAX) packet Take 17 g by mouth daily.  . [DISCONTINUED] dexamethasone (DECADRON) 4 MG tablet Take 1 tablet (4 mg total) by mouth 2 (two) times daily with a meal.   No facility-administered encounter medications on file as of 08/20/2017.     Review of Systems   In general she is not complaining of any fever or chills   skin is not complaining of rashes or itching    head ears eyes nose mouth throat I Does not really complain of visual  changes sore throat or headache   Respiratory-she is not complaining of shortness of breath or cough  Cardiac denies chest pain has some baseline lower extremity edema-which appears to be fairly mild  GI does not complain of abdominal discomfort nausea vomiting diarrhea constipation    Musculoskeletal is not complaining of joint pain currently.  Neurologic  Continues to deny dizziness or headaches or numbness  Psych Says her anxiety is improved today      Immunization History  Administered Date(s) Administered  . Influenza Split 11/30/2012  . Influenza,inj,Quad PF,6+ Mos 11/30/2014, 01/09/2017  . Influenza-Unspecified 01/17/2012, 12/21/2013  . Pneumococcal Conjugate-13 10/08/2013  . Pneumococcal Polysaccharide-23 10/24/2015  . Td 03/20/2011       Pertinent  Health Maintenance Due  Topic Date Due  . FOOT EXAM  09/06/2017 (Originally 03/12/2017)  . OPHTHALMOLOGY EXAM  09/06/2017 (Originally 08/01/2017)  . URINE MICROALBUMIN  09/06/2017 (Originally 09/07/2015)  . INFLUENZA VACCINE  10/16/2017  . HEMOGLOBIN A1C  12/13/2017  . DEXA SCAN  Completed  . PNA vac Low Risk Adult  Completed   Fall Risk  06/24/2017 09/30/2016 06/05/2015 11/30/2014 07/06/2013  Falls in the past year? No No No No No  Risk for fall due to : Impaired balance/gait;Impaired mobility;History of fall(s);Medication side effect - - - -   Physical exam.  She is afebrile pulse is 62 respirations of 18 blood pressure 132/78  In general this is a very pleasant elderly female in no distress sitting comfortably in her wheelchair she appears to be in good spirits visiting with family    chest is clear to auscultation there is no labored breathing  Heart is regular rate and rhythm without murmur gallop or rub has baseline lower extremity edema   abdomen is obese soft nontender with positive bowel sounds  Musculoskeletal continues to move all extremities x4 at baseline and ambulates largely in  wheelchair.  Neurologic appears grossly intact her speech is clear could not really appreciate lateralizing findings  Psych she is alert and oriented pleasant and appropriate  Labs.  August 21, 2017.  Sodium 139 potassium 5.6 BUN 51 creatinine 0.83- calcium is 10.1  August 18, 2017.  WBC 26.1-hemoglobin 12.1 platelets 145.  Assessment and plan.  Hyperkalemia- this was discussed with Dr. Lyndel Safe and will give her 1 dose of Kayexalate 15 g and recheck a BMP next week-she appears to be asymptomatic.  Also have spoken with dietary who we will put her on a low potassium diet.  2.  Anxiety this appears to be improved with the routine Ativan at this point will monitor.  [id number]

## 2017-08-22 ENCOUNTER — Other Ambulatory Visit: Payer: Self-pay | Admitting: *Deleted

## 2017-08-22 NOTE — Patient Outreach (Signed)
[facility]) Care Management  08/22/2017  Cynthia Marks 06-09-1932 [id number]   Notified by [facility] nurse that patient will transition to LTC with Hospice at facility. No [facility] care management needs identified at this time.  Royetta Crochet. Laymond Purser, RN, BSN, [place] [phone]) Business Cell  [phone]) Toll Free Office

## 2017-08-25 ENCOUNTER — Encounter (HOSPITAL_COMMUNITY)
Admission: RE | Admit: 2017-08-25 | Discharge: 2017-08-25 | Disposition: A | Discharge status: Home / Self Care | Payer: Medicare Other | Source: Skilled Nursing Facility | Attending: Internal Medicine | Admitting: Internal Medicine

## 2017-08-25 DIAGNOSIS — I10 Essential (primary) hypertension: Secondary | ICD-10-CM | POA: Diagnosis not present

## 2017-08-25 LAB — BASIC METABOLIC PANEL
ANION GAP: 5 (ref 5–15)
BUN: 47 mg/dL — ABNORMAL HIGH (ref 6–20)
CALCIUM: 9.7 mg/dL (ref 8.9–10.3)
CO2: 26 mmol/L (ref 22–32)
Chloride: 109 mmol/L (ref 101–111)
Creatinine, Ser: 0.74 mg/dL (ref 0.44–1.00)
GFR calc non Af Amer: >60 mL/min (ref >60)
Glucose, Bld: 98 mg/dL (ref 65–99)
Potassium: 4.8 mmol/L (ref 3.5–5.1)
Sodium: 140 mmol/L (ref 135–145)

## 2017-08-25 LAB — CBC
HEMATOCRIT: 34.2 % — AB (ref 36.0–46.0)
Hemoglobin: 11.3 g/dL — ABNORMAL LOW (ref 12.0–15.0)
MCH: 31.4 pg (ref 26.0–34.0)
MCHC: 33 g/dL (ref 30.0–36.0)
MCV: 95 fL (ref 78.0–100.0)
PLATELETS: 76 10*3/uL — AB (ref 150–400)
RBC: 3.6 MIL/uL — ABNORMAL LOW (ref 3.87–5.11)
RDW: 14.6 % (ref 11.5–15.5)
WBC: 19.8 10*3/uL — AB (ref 4.0–10.5)

## 2017-08-26 DIAGNOSIS — R231 Pallor: Secondary | ICD-10-CM | POA: Diagnosis not present

## 2017-08-26 DIAGNOSIS — C713 Malignant neoplasm of parietal lobe: Secondary | ICD-10-CM | POA: Diagnosis not present

## 2017-08-26 DIAGNOSIS — E039 Hypothyroidism, unspecified: Secondary | ICD-10-CM | POA: Diagnosis not present

## 2017-08-26 DIAGNOSIS — I10 Essential (primary) hypertension: Secondary | ICD-10-CM | POA: Diagnosis not present

## 2017-08-26 DIAGNOSIS — E118 Type 2 diabetes mellitus with unspecified complications: Secondary | ICD-10-CM | POA: Diagnosis not present

## 2017-08-26 DIAGNOSIS — R1310 Dysphagia, unspecified: Secondary | ICD-10-CM | POA: Diagnosis not present

## 2017-08-26 DIAGNOSIS — R531 Weakness: Secondary | ICD-10-CM | POA: Diagnosis not present

## 2017-08-26 DIAGNOSIS — E212 Other hyperparathyroidism: Secondary | ICD-10-CM | POA: Diagnosis not present

## 2017-08-27 ENCOUNTER — Encounter (HOSPITAL_COMMUNITY)
Admission: RE | Admit: 2017-08-27 | Discharge: 2017-08-27 | Disposition: A | Discharge status: Home / Self Care | Payer: Medicare Other | Source: Skilled Nursing Facility | Attending: Internal Medicine | Admitting: Internal Medicine

## 2017-08-27 DIAGNOSIS — E038 Other specified hypothyroidism: Secondary | ICD-10-CM | POA: Diagnosis present

## 2017-08-27 DIAGNOSIS — E119 Type 2 diabetes mellitus without complications: Secondary | ICD-10-CM | POA: Diagnosis present

## 2017-08-27 DIAGNOSIS — I10 Essential (primary) hypertension: Secondary | ICD-10-CM | POA: Diagnosis not present

## 2017-08-27 DIAGNOSIS — E213 Hyperparathyroidism, unspecified: Secondary | ICD-10-CM | POA: Insufficient documentation

## 2017-08-27 DIAGNOSIS — C713 Malignant neoplasm of parietal lobe: Secondary | ICD-10-CM | POA: Diagnosis not present

## 2017-08-27 DIAGNOSIS — E118 Type 2 diabetes mellitus with unspecified complications: Secondary | ICD-10-CM | POA: Diagnosis not present

## 2017-08-27 DIAGNOSIS — R531 Weakness: Secondary | ICD-10-CM | POA: Diagnosis not present

## 2017-08-27 DIAGNOSIS — E039 Hypothyroidism, unspecified: Secondary | ICD-10-CM | POA: Diagnosis not present

## 2017-08-27 DIAGNOSIS — E212 Other hyperparathyroidism: Secondary | ICD-10-CM | POA: Diagnosis not present

## 2017-08-27 LAB — BASIC METABOLIC PANEL
Anion gap: 6 (ref 5–15)
BUN: 52 mg/dL — ABNORMAL HIGH (ref 6–20)
CO2: 27 mmol/L (ref 22–32)
CREATININE: 0.71 mg/dL (ref 0.44–1.00)
Calcium: 9.8 mg/dL (ref 8.9–10.3)
Chloride: 106 mmol/L (ref 101–111)
GFR calc Af Amer: >60 mL/min (ref >60)
Glucose, Bld: 114 mg/dL — ABNORMAL HIGH (ref 65–99)
Potassium: 5.1 mmol/L (ref 3.5–5.1)
SODIUM: 139 mmol/L (ref 135–145)

## 2017-08-28 ENCOUNTER — Inpatient Hospital Stay: Payer: Medicare Other | Admitting: Internal Medicine

## 2017-08-28 DIAGNOSIS — I10 Essential (primary) hypertension: Secondary | ICD-10-CM | POA: Diagnosis not present

## 2017-08-28 DIAGNOSIS — R531 Weakness: Secondary | ICD-10-CM | POA: Diagnosis not present

## 2017-08-28 DIAGNOSIS — C713 Malignant neoplasm of parietal lobe: Secondary | ICD-10-CM | POA: Diagnosis not present

## 2017-08-28 DIAGNOSIS — E118 Type 2 diabetes mellitus with unspecified complications: Secondary | ICD-10-CM | POA: Diagnosis not present

## 2017-08-28 DIAGNOSIS — E212 Other hyperparathyroidism: Secondary | ICD-10-CM | POA: Diagnosis not present

## 2017-08-28 DIAGNOSIS — E039 Hypothyroidism, unspecified: Secondary | ICD-10-CM | POA: Diagnosis not present

## 2017-08-29 ENCOUNTER — Non-Acute Institutional Stay (SKILLED_NURSING_FACILITY): Payer: Medicare Other | Admitting: Internal Medicine

## 2017-08-29 ENCOUNTER — Other Ambulatory Visit: Payer: Self-pay

## 2017-08-29 ENCOUNTER — Encounter: Payer: Self-pay | Admitting: Internal Medicine

## 2017-08-29 DIAGNOSIS — I10 Essential (primary) hypertension: Secondary | ICD-10-CM

## 2017-08-29 DIAGNOSIS — E875 Hyperkalemia: Secondary | ICD-10-CM

## 2017-08-29 DIAGNOSIS — G939 Disorder of brain, unspecified: Secondary | ICD-10-CM

## 2017-08-29 DIAGNOSIS — F32A Depression, unspecified: Secondary | ICD-10-CM

## 2017-08-29 DIAGNOSIS — F329 Major depressive disorder, single episode, unspecified: Secondary | ICD-10-CM | POA: Diagnosis not present

## 2017-08-29 DIAGNOSIS — E212 Other hyperparathyroidism: Secondary | ICD-10-CM | POA: Diagnosis not present

## 2017-08-29 DIAGNOSIS — C713 Malignant neoplasm of parietal lobe: Secondary | ICD-10-CM | POA: Diagnosis not present

## 2017-08-29 DIAGNOSIS — E118 Type 2 diabetes mellitus with unspecified complications: Secondary | ICD-10-CM | POA: Diagnosis not present

## 2017-08-29 DIAGNOSIS — E039 Hypothyroidism, unspecified: Secondary | ICD-10-CM | POA: Diagnosis not present

## 2017-08-29 DIAGNOSIS — R531 Weakness: Secondary | ICD-10-CM | POA: Diagnosis not present

## 2017-08-29 MED ORDER — LORAZEPAM 0.5 MG PO TABS: 0.5000 mg | ORAL_TABLET | Freq: Every day | ORAL | 0 refills | Status: DC

## 2017-08-29 NOTE — Telephone Encounter (Signed)
RX Fax for [facility]@ [phone]  

## 2017-08-29 NOTE — Progress Notes (Signed)
Location:   [facility] Room Number: 155/P Place of Service:  SNF (31) Provider:  Ewell Poe, MD  Patient Care Team: Kathyrn Drown, MD as PCP - General (Family Medicine)  Extended Emergency Contact Information Primary Emergency Contact: Luebbe,[name] Address: [address]          [place], [place] [address] [name] of [place] Phone: [phone] Mobile Phone: [phone] Relation: Spouse Secondary Emergency Contact: cliborne,kathy Home Phone: [phone] Relation: Sister  Code Status:  DNR Goals of care: Advanced Directive information Advanced Directives 08/29/2017  Does Patient Have a Medical Advance Directive? Yes  Type of Advance Directive Out of facility DNR (pink MOST or yellow form)  Does patient want to make changes to medical advance directive? No - Patient declined  Copy of [place] in Chart? -  Would patient like information on creating a medical advance directive? No - Patient declined  Pre-existing out of facility DNR order (yellow form or pink MOST form) -     Chief complaint- acute visit follow-up hyperkalemia- also concerns for depression  HPI:  Pt is a 84 y.o. female seen today for an acute visit for follow-up of hyperkalemia-as well as nursing concerns patient may be somewhat depressed.  Patient has a history of hypertension as well as hypercalcemia with hyperparathyroidism as well as chronic kidney disease hypothyroidism and macular degeneration.  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  She is also on Keppra for suspected seizure activity.  Hospice services and hospice nurse noted that she appears to be showing some signs of depression due to mood.  Talking with her today she does agree with this-.  She also has anxiety and has  Ativan twice a day and apparently this is  helping  Currently she is resting in bed comfortably and visiting with family.  She does state that she does feel somewhat depressed and is open to starting depressant.  Regards to hyperkalemia at one point she had a potassium of 5.6.  Is not on potassium are an ACE inhibitor.  She did receive a dose of Kayexalate and this did normalize- updated labs show potassium of 5.1.---.      Past Medical History:  Diagnosis Date  . Arthritis    Knees  . Chronic back pain   . Chronic pain of right knee   . Diabetes mellitus   . Gait abnormality   . Hypertension   . Macular degeneration   . Thyroid disease    Past Surgical History:  Procedure Laterality Date  . ABDOMINAL HYSTERECTOMY    . COLONOSCOPY    . SHOULDER SURGERY      Allergies  Allergen Reactions  . Aspirin   . Ibuprofen     Nausea or HTN  . Penicillins Rash    Has patient had a PCN reaction causing immediate rash, facial/tongue/throat swelling, SOB or lightheadedness with hypotension: unknown Has patient had a PCN reaction causing severe rash involving mucus membranes or skin necrosis: unknown Has patient had a PCN reaction that required hospitalization: no Has patient had a PCN reaction occurring within the last 10 years: No If all of the above answers are "NO", then may proceed with Cephalosporin use.     Outpatient Encounter Medications as of 08/29/2017  Medication Sig  . acetaminophen (TYLENOL) 650 MG CR tablet Take 650 mg by mouth every 8 (  eight) hours as needed for pain.   Marland Kitchen amLODipine (NORVASC) 2.5 MG tablet TAKE 1 TABLET BY MOUTH EVERY DAY  . carvedilol (COREG) 6.25 MG tablet Take 6.25 mg by mouth 2 (two) times daily with a meal.  . Cholecalciferol 5000 units capsule Take 5,000 Units by mouth daily.  Marland Kitchen dexamethasone (DECADRON) 6 MG tablet Take 3 mg by mouth 2 (two) times daily with a meal.  . levETIRAcetam (KEPPRA) 500 MG tablet Take 1 tablet (500 mg total) by mouth 2 (two) times daily.  Marland Kitchen levothyroxine  (SYNTHROID, LEVOTHROID) 150 MCG tablet Take 2 tablets on mon, wed, and Friday. Take one and a half tablets on tues, thurs, sat, and sun  . LORazepam (ATIVAN) 0.5 MG tablet Give 1 tablet by mouth at bedtime for agitation and anxiety or depression.  Marland Kitchen LORazepam (ATIVAN) 0.5 MG tablet Give 1/2 tablet (0.25 mg ) by mouth every morning for persistent agitation and anxiety  . Multiple Vitamins-Minerals (MULTI-VITAMIN/MINERALS PO) Take by mouth. Give 1 tablet by mouth once a day  . polyethylene glycol (MIRALAX / GLYCOLAX) packet Take 17 g by mouth as needed.   . polyethylene glycol (MIRALAX / GLYCOLAX) packet Take 17 g by mouth daily.  . [DISCONTINUED] LORazepam (ATIVAN) 0.5 MG tablet Take 0.5 tablets (0.25 mg total) by mouth at bedtime as needed for anxiety.   No facility-administered encounter medications on file as of 08/29/2017.     Review of Systems   General she is not complaining of fever chills she does appear to be somewhat more weak agraffe skin is not complaining of rashes or itching.  Head ears eyes nose mouth and throat does not complain of visual changes or sore throat.  Spray is not complaining shortness of breath or cough.  Cardiac is not complaining of any chest pain has some mild lower extremity edema which appears relatively stable.  GI does not complain of abdominal pain nausea vomiting diarrhea or constipation.  GU does not complain of dysuria.  Musculoskeletal at times will complain of back pain but says Tylenol she receives does help  Neurologic does not complain of dizziness headache or syncope.  Psych again does have some history of anxiety --Ativan appears to help-nursing staff has noted somewhat of a depressed subdued mood she is in agreement with this  Immunization History  Administered Date(s) Administered  . Influenza Split 11/30/2012  . Influenza,inj,Quad PF,6+ Mos 11/30/2014, 01/09/2017  . Influenza-Unspecified 01/17/2012, 12/21/2013  . Pneumococcal  Conjugate-13 10/08/2013  . Pneumococcal Polysaccharide-23 10/24/2015  . Td 03/20/2011   Pertinent  Health Maintenance Due  Topic Date Due  . FOOT EXAM  09/06/2017 (Originally 03/12/2017)  . OPHTHALMOLOGY EXAM  09/06/2017 (Originally 08/01/2017)  . URINE MICROALBUMIN  09/06/2017 (Originally 09/07/2015)  . INFLUENZA VACCINE  10/16/2017  . HEMOGLOBIN A1C  12/13/2017  . DEXA SCAN  Completed  . PNA vac Low Risk Adult  Completed   Fall Risk  06/24/2017 09/30/2016 06/05/2015 11/30/2014 07/06/2013  Falls in the past year? No No No No No  Risk for fall due to : Impaired balance/gait;Impaired mobility;History of fall(s);Medication side effect - - - -   Functional Status Survey:    She is afebrile pulse is 72 respirations of 17 blood pressure taken manually 158/80 previous blood pressures 107/56-119/63- apparently at times will have spikes slightly higher than this  Physical Exam   In general this is a very pleasant elderly female no distress lying comfortably in bed.  Her skin is warm and dry.  Eyes  visual acuity appears intact sclera and conjunctive are clear.  Oropharynx is clear tongue is midline full range of motion.  Chest is clear to auscultation there is no labored breathing.  Heart is regular rate and rhythm without murmur gallop or rub she has baseline lower extremity edema.  Her abdomen is somewhat obese soft it is nontender has positive bowel sounds  Muscular skeletal does move all extremities x4 limited exam since she is in bed but does appear to have some lower extremity weakness-  Neurologic cannot really appreciate lateralizing findings she does have weakness her speech is clear could not appreciate any tremors.  Psych she is alert and oriented pleasant and appropriate does admit to feeling somewhat subdued- is understandable considering her medical challenges  Labs reviewed: Recent Labs    10/07/16 1119  08/03/17 0649 08/04/17 0529 07/29/2017 0436  08/21/17 0718  08/25/17 0758 08/27/17 0741  NA  --    < > 140 140 139   < > 139 140 139  K  --    < > 4.4 4.5 4.5   < > 5.6* 4.8 5.1  CL  --    < > 106 108 109   < > 107 109 106  CO2  --    < > 24 24 24    < > 29 26 27   GLUCOSE  --    < > 90 97 151*   < > 101* 98 114*  BUN  --    < > 25* 20 24*   < > 51* 47* 52*  CREATININE  --    < > 0.97 0.91 0.93   < > 0.83 0.74 0.71  CALCIUM 10.2   < > 10.2 9.6 9.8   < > 10.1 9.7 9.8  MG 1.9  --  1.9 1.9 2.1  --   --   --   --   PHOS 3.0  --   --   --   --   --   --   --   --    < > = values in this interval not displayed.   Recent Labs    08/04/17 0529 07/28/2017 0436 08/14/17 0726  AST 16 18 26   ALT 9* 9* 26  ALKPHOS 57 64 59  BILITOT 0.5 0.5 0.7  PROT 6.8 7.4 6.5  ALBUMIN 3.1* 3.4* 3.2*   Recent Labs    08/03/17 0649 07/28/2017 0436 08/07/17 0744 08/18/17 0700 08/25/17 0758  WBC 9.6 8.2 16.6* 26.1* 19.8*  NEUTROABS 7.3 7.3  --  23.9*  --   HGB 11.3* 11.8* 11.3* 12.1 11.3*  HCT 35.9* 37.6 36.1 37.1 34.2*  MCV 97.6 97.7 97.0 95.9 95.0  PLT 267 289 312 145* 76*   Lab Results  Component Value Date   TSH 3.140 08/02/2017   Lab Results  Component Value Date   HGBA1C 5.3 06/12/2017   Lab Results  Component Value Date   CHOL 147 05/09/2016   HDL 36 (L) 05/09/2016   LDLCALC 92 05/09/2016   TRIG 95 05/09/2016   CHOLHDL 4.1 05/09/2016    Significant Diagnostic Results in last 30 days:  Dg Ankle Complete Left  Result Date: 08/02/2017 CLINICAL DATA:  Fall in bathroom with left ankle pain. EXAM: LEFT ANKLE COMPLETE - 3+ VIEW COMPARISON:  None. FINDINGS: Mild soft tissue swelling over the ankle. Ankle mortise is within normal. No evidence of acute fracture or dislocation. IMPRESSION: No acute fracture. Electronically Signed   By: Marin Olp  M.D.   On: 08/02/2017 08:47   Dg Ankle Complete Right  Result Date: 08/02/2017 CLINICAL DATA:  Fall in bathroom with right ankle pain. EXAM: RIGHT ANKLE - COMPLETE 3+ VIEW COMPARISON:  07/22/2017 FINDINGS:  Examination demonstrates minimal soft tissue swelling over the ankle. No evidence of acute fracture or dislocation. Old healed distal fibular and medial malleolar fractures. Slight stable widening of the medial aspect of the ankle mortise. Small inferior calcaneal spur. IMPRESSION: No acute fracture. Old bimalleolar fractures and stable widening of the medial aspect of the ankle mortise. Electronically Signed   By: Marin Olp M.D.   On: 08/02/2017 08:46   Ct Head Wo Contrast  Result Date: 08/02/2017 CLINICAL DATA:  Fall.  Hit left side of head. EXAM: CT HEAD WITHOUT CONTRAST CT CERVICAL SPINE WITHOUT CONTRAST TECHNIQUE: Multidetector CT imaging of the head and cervical spine was performed following the standard protocol without intravenous contrast. Multiplanar CT image reconstructions of the cervical spine were also generated. COMPARISON:  CT head dated December 04, 2011. FINDINGS: CT HEAD FINDINGS Brain: There is new hypodensity within the right parietal lobe surrounding a questionable mass lesion (series 4, image 52; series 5, image 25). There is mild effacement of the adjacent sulci. No evidence of acute infarction, hemorrhage, hydrocephalus, or extra-axial collection. Stable mild cerebral atrophy and chronic microvascular ischemic changes. Vascular: Atherosclerotic vascular calcification of the carotid siphons. No hyperdense vessel. Skull: Negative for fracture or focal lesion. Sinuses/Orbits: No acute finding. Other: None. CT CERVICAL SPINE FINDINGS Alignment: Normal. Skull base and vertebrae: No acute fracture. No primary bone lesion or focal pathologic process. Soft tissues and spinal canal: No prevertebral fluid or swelling. No visible canal hematoma. Disc levels: Mild disc height loss from C4-C5 through C6-C7. Moderate uncovertebral hypertrophy at C5-C6 and C6-C7. Severe facet arthropathy throughout the cervical spine with fusion of the right C4-C5 and left C3-C4 facet joints. Upper chest:  Negative. Other: None. IMPRESSION: 1. New questionable mass lesion in the right parietal lobe with surrounding vasogenic edema. Recommend contrast-enhanced MRI of the brain for further evaluation. 2. No evidence of traumatic injury within the head or cervical spine. Electronically Signed   By: Titus Dubin M.D.   On: 08/02/2017 08:29   Ct Chest W Contrast  Result Date: 08/02/2017 CLINICAL DATA:  Initial workup due to presumed CNS metastasis. EXAM: CT CHEST, ABDOMEN, AND PELVIS WITH CONTRAST TECHNIQUE: Multidetector CT imaging of the chest, abdomen and pelvis was performed following the standard protocol during bolus administration of intravenous contrast. CONTRAST:  75 cc ISOVUE-300 IOPAMIDOL (ISOVUE-300) INJECTION 61%, 53mL ISOVUE-300 IOPAMIDOL (ISOVUE-300) INJECTION 61% COMPARISON:  08/02/2017 head CT, chest radiograph 05/28/2017 FINDINGS: CT CHEST FINDINGS Cardiovascular: Retroesophageal course of the internal carotid arteries bilaterally. Conventional branch pattern of the great vessels without significant stenosis. Ectatic ascending thoracic aorta to 3.7 cm. No thoracic aortic dissection. Minimal coronary arteriosclerosis along left main and proximal LAD. No acute pulmonary embolus to the segmental level. Heart size is borderline enlarged without pericardial effusion. Mediastinum/Nodes: Thyroid goiter with coarse calcifications within the left lobe. Subcentimeter cystic nodules are identified the lower poles of both lobes. Patent midline trachea and mainstem bronchi. No mediastinal hilar lymphadenopathy. Lungs/Pleura: No dominant mass or pulmonary consolidation. Multilobar bilateral subpleural areas of atelectasis and/or scarring. Mild peribronchial thickening within both lower lobes with subpleural areas of interstitial prominence and/or fibrosis in the lower lobes bilaterally. Small paraseptal foci of emphysema along the periphery of the right middle and both lower lobes. No effusion or pneumothorax.  Musculoskeletal: Subchondral cystic change of both humeral heads and glenoid fossae. No aggressive osteolytic or blastic disease. Degenerative disc disease T7 through T10. CT ABDOMEN PELVIS FINDINGS Hepatobiliary: The liver enhances homogeneously. The gallbladder is physiologically distended without calculus. No biliary dilatation noted. Pancreas: No enhancing pancreatic mass or ductal dilatation. No inflammation. There appears to be mild side branch ductal ectasia of the pancreatic tail. Tiny side-branch intraductal papillary mucinous neoplasms are not entirely excluded, series 2/54. Spleen: Normal size.  No enhancing mass. Adrenals/Urinary Tract: Normal bilateral adrenal glands. 2.2 cm upper pole right renal cyst, simple in appearance with smaller subcentimeter hypodense lesions of both kidneys statistically consistent with cysts in the interpolar aspect of both kidneys and right lower pole. No nephrolithiasis, enhancing renal mass nor obstructive uropathy. The urinary bladder demonstrates no calculus or focal mural thickening. No focal mass. Stomach/Bowel: Small hiatal hernia. Decompressed stomach. Normal small bowel rotation is noted. No small-bowel dilatation or mass. No inflammation. The distal terminal ileum are unremarkable. No findings of acute appendicitis. Scattered colonic diverticulosis without acute diverticulitis. No annular constricting lesions. Moderate fecal retention within the descending and sigmoid colon. Vascular/Lymphatic: Mild-to-moderate aortic atherosclerosis. No aneurysm or adenopathy. Reproductive: Hysterectomy.  No adnexal mass. Other: No free air nor free fluid. Musculoskeletal: Degenerative disc disease L4-5. No aggressive osteolytic or blastic disease. Multilevel degenerative facet arthropathy of the lumbar spine. Sclerosis about the pubic symphysis and both SI joints. Lumbosacral transitional vertebral anatomy with pseudoarticulation of L5 with S1 on the left. IMPRESSION: Chest CT:  1. Multinodular goiter. 2. Coronary arteriosclerosis and aortic atherosclerosis. No aneurysm or acute pulmonary embolus. 3. No pulmonary lesions. 4. Minimal subpleural areas of bilateral atelectasis and/or fibrosis. CT AP: 1. Tiny cystic foci in the pancreatic tail may represent ectatic pancreatic duct side branches. Side branch IPMN might also be a possibility. 2. Bilateral renal cysts, the largest measuring 2.2 cm in the upper pole. The remainder are too small to further characterize in the interpolar aspect of both kidneys and right lower pole. 3. Scattered colonic diverticulosis without acute diverticulitis. Masslike abnormality identified. Electronically Signed   By: Ashley Royalty M.D.   On: 08/02/2017 19:16   Ct Cervical Spine Wo Contrast  Result Date: 08/02/2017 CLINICAL DATA:  Fall.  Hit left side of head. EXAM: CT HEAD WITHOUT CONTRAST CT CERVICAL SPINE WITHOUT CONTRAST TECHNIQUE: Multidetector CT imaging of the head and cervical spine was performed following the standard protocol without intravenous contrast. Multiplanar CT image reconstructions of the cervical spine were also generated. COMPARISON:  CT head dated December 04, 2011. FINDINGS: CT HEAD FINDINGS Brain: There is new hypodensity within the right parietal lobe surrounding a questionable mass lesion (series 4, image 52; series 5, image 25). There is mild effacement of the adjacent sulci. No evidence of acute infarction, hemorrhage, hydrocephalus, or extra-axial collection. Stable mild cerebral atrophy and chronic microvascular ischemic changes. Vascular: Atherosclerotic vascular calcification of the carotid siphons. No hyperdense vessel. Skull: Negative for fracture or focal lesion. Sinuses/Orbits: No acute finding. Other: None. CT CERVICAL SPINE FINDINGS Alignment: Normal. Skull base and vertebrae: No acute fracture. No primary bone lesion or focal pathologic process. Soft tissues and spinal canal: No prevertebral fluid or swelling. No  visible canal hematoma. Disc levels: Mild disc height loss from C4-C5 through C6-C7. Moderate uncovertebral hypertrophy at C5-C6 and C6-C7. Severe facet arthropathy throughout the cervical spine with fusion of the right C4-C5 and left C3-C4 facet joints. Upper chest: Negative. Other: None. IMPRESSION: 1. New questionable mass lesion in the  right parietal lobe with surrounding vasogenic edema. Recommend contrast-enhanced MRI of the brain for further evaluation. 2. No evidence of traumatic injury within the head or cervical spine. Electronically Signed   By: Titus Dubin M.D.   On: 08/02/2017 08:29   Mr Jodene Nam Head Wo Contrast  Result Date: 08/04/2017 CLINICAL DATA:  Recent fall. Seizure. Abnormal neuro exam. Abnormal CT of the head. EXAM: MRI HEAD WITHOUT CONTRAST MRA HEAD WITHOUT CONTRAST TECHNIQUE: Multiplanar, multiecho pulse sequences of the brain and surrounding structures were obtained without intravenous contrast. Angiographic images of the head were obtained using MRA technique without contrast. COMPARISON:  CT head without contrast 08/02/2017. FINDINGS: MRI HEAD FINDINGS Brain: A mass lesion in the medial right parietal lobe measures at least 2.9 x 2.0 x 3.7 cm. There is significant surrounding vasogenic edema. This effaces the sulci and has some mass effect on the right lateral ventricle. There is no hydrocephalus. Restricted diffusion is evident within the lesion and scattered throughout the adjacent white matter. Additional cortical restricted diffusion is present separate from the measured mass lesion. Abnormal T2 signal extends into the splenium of the corpus callosum. There is additional signal surrounding the atrium of the right lateral ventricle. No acute hemorrhage is present. Mild periventricular white matter changes are present otherwise. No significant extra-axial fluid collection is present. The internal auditory canals are within normal limits bilaterally. The brainstem is normal. Remote  lacunar infarcts are present in the inferior cerebellum bilaterally. No acute abnormality is present. Vascular: Flow is present in the major intracranial arteries. Skull and upper cervical spine: The skull base is within normal limits. The craniocervical junction is normal. Sinuses/Orbits: The paranasal sinuses and mastoid air cells are clear. MRA HEAD FINDINGS Atherosclerotic irregularity is present within the cavernous internal carotid arteries bilaterally. There is mild narrowing of approximately 50% at the ophthalmic segment on the right. ICA termini are intact. A moderate proximal right A1 segment stenosis is present. A moderate proximal left M1 segment stenosis is present. The anterior communicating artery is patent. There is asymmetric attenuation of the distal right M1 segment and right MCA branch vessels compared to the left. The vertebral arteries are codominant. PICA origins are not visualized. Vertebrobasilar junction is normal. Both posterior cerebral arteries originate from basilar tip. There is signal loss in the PCA branches bilaterally. IMPRESSION: 1. Infiltrative mass lesion in the medial right parietal lobe measures at least 2.9 x 2.0 x 3.7 cm. 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 MRI with contrast could be used for further evaluation if clinically indicated. T2 signal changes separate from the measured lesion likely represent infiltrative disease is well. 2. Mass effect from the primary lesion and surrounding vasogenic edema without hydrocephalus or downward herniation. 3. Mild stenosis involving the ophthalmic segment of the right internal carotid artery. 4. Moderate right A1 and left M1 proximal stenoses. 5. Asymmetric attenuation of the distal right M1 segment and right MCA branch vessels compared to the left. Electronically Signed   By: San Morelle M.D.   On:  08/04/2017 08:57   Mr Brain Wo Contrast  Result Date: 08/04/2017 CLINICAL DATA:  Recent fall. Seizure. Abnormal neuro exam. Abnormal CT of the head. EXAM: MRI HEAD WITHOUT CONTRAST MRA HEAD WITHOUT CONTRAST TECHNIQUE: Multiplanar, multiecho pulse sequences of the brain and surrounding structures were obtained without intravenous contrast. Angiographic images of the head were obtained using MRA technique without contrast. COMPARISON:  CT head without contrast 08/02/2017. FINDINGS: MRI HEAD FINDINGS Brain: A mass lesion in the medial right parietal lobe measures at least 2.9 x 2.0 x 3.7 cm. There is significant surrounding vasogenic edema. This effaces the sulci and has some mass effect on the right lateral ventricle. There is no hydrocephalus. Restricted diffusion is evident within the lesion and scattered throughout the adjacent white matter. Additional cortical restricted diffusion is present separate from the measured mass lesion. Abnormal T2 signal extends into the splenium of the corpus callosum. There is additional signal surrounding the atrium of the right lateral ventricle. No acute hemorrhage is present. Mild periventricular white matter changes are present otherwise. No significant extra-axial fluid collection is present. The internal auditory canals are within normal limits bilaterally. The brainstem is normal. Remote lacunar infarcts are present in the inferior cerebellum bilaterally. No acute abnormality is present. Vascular: Flow is present in the major intracranial arteries. Skull and upper cervical spine: The skull base is within normal limits. The craniocervical junction is normal. Sinuses/Orbits: The paranasal sinuses and mastoid air cells are clear. MRA HEAD FINDINGS Atherosclerotic irregularity is present within the cavernous internal carotid arteries bilaterally. There is mild narrowing of approximately 50% at the ophthalmic segment on the right. ICA termini are intact. A moderate proximal  right A1 segment stenosis is present. A moderate proximal left M1 segment stenosis is present. The anterior communicating artery is patent. There is asymmetric attenuation of the distal right M1 segment and right MCA branch vessels compared to the left. The vertebral arteries are codominant. PICA origins are not visualized. Vertebrobasilar junction is normal. Both posterior cerebral arteries originate from basilar tip. There is signal loss in the PCA branches bilaterally. IMPRESSION: 1. Infiltrative mass lesion in the medial right parietal lobe measures at least 2.9 x 2.0 x 3.7 cm. 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 MRI with contrast could be used for further evaluation if clinically indicated. T2 signal changes separate from the measured lesion likely represent infiltrative disease is well. 2. Mass effect from the primary lesion and surrounding vasogenic edema without hydrocephalus or downward herniation. 3. Mild stenosis involving the ophthalmic segment of the right internal carotid artery. 4. Moderate right A1 and left M1 proximal stenoses. 5. Asymmetric attenuation of the distal right M1 segment and right MCA branch vessels compared to the left. Electronically Signed   By: San Morelle M.D.   On: 08/04/2017 08:57   Mr Brain W Contrast  Result Date: 08/04/2017 CLINICAL DATA:  Altered mental status for 2 days. Follow-up evaluation. EXAM: MRI HEAD WITH CONTRAST TECHNIQUE: Multiplanar, multiecho pulse sequences of the brain and surrounding structures were obtained with intravenous contrast. CONTRAST:  19mL MULTIHANCE GADOBENATE DIMEGLUMINE 529 MG/ML IV SOLN COMPARISON:  MRI of the head without contrast Aug 04, 2017 at 0825 hours FINDINGS: Brain: 3 x 3.4 x 5.4 cm heterogeneously enhancing lobulated RIGHT mesial parietal lobe mass corresponding to previous abnormality. There 2  adjacent nodules within the splenium of the corpus callosum measuring to 18 x 8 mm. In addition, 4 mm nodular enhancement RIGHT inferior frontal cortex. No abnormal extra-axial enhancement. Vascular: Not tailored for evaluation. Skull and upper cervical spine: No abnormal osseous enhancement. Sinuses/Orbits: No abnormal enhancement. Other: None. IMPRESSION: 1. Four suspicious foci of intraparenchymal enhancement, dominant lesion RIGHT parietal lobe 3 x 3.4 x 5.4 cm. 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 Electronically Signed  By: Elon Alas M.D.   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 EXAM: CT CHEST, ABDOMEN, AND PELVIS WITH CONTRAST TECHNIQUE: Multidetector CT imaging of the chest, abdomen and pelvis was performed following the standard protocol during bolus administration of intravenous contrast. CONTRAST:  75 cc ISOVUE-300 IOPAMIDOL (ISOVUE-300) INJECTION 61%, 64mL ISOVUE-300 IOPAMIDOL (ISOVUE-300) INJECTION 61% COMPARISON:  08/02/2017 head CT, chest radiograph 05/28/2017 FINDINGS: CT CHEST FINDINGS Cardiovascular: Retroesophageal course of the internal carotid arteries bilaterally. Conventional branch pattern of the great vessels without significant stenosis. Ectatic ascending thoracic aorta to 3.7 cm. No thoracic aortic dissection. Minimal coronary arteriosclerosis along left main and proximal LAD. No acute pulmonary embolus to the segmental level. Heart size is borderline enlarged without pericardial effusion. Mediastinum/Nodes: Thyroid goiter with coarse calcifications within the left lobe. Subcentimeter cystic nodules are identified the lower poles of both lobes. Patent midline trachea and mainstem bronchi. No mediastinal hilar lymphadenopathy. Lungs/Pleura: No dominant mass or pulmonary consolidation. Multilobar bilateral subpleural areas of atelectasis and/or  scarring. Mild peribronchial thickening within both lower lobes with subpleural areas of interstitial prominence and/or fibrosis in the lower lobes bilaterally. Small paraseptal foci of emphysema along the periphery of the right middle and both lower lobes. No effusion or pneumothorax. Musculoskeletal: Subchondral cystic change of both humeral heads and glenoid fossae. No aggressive osteolytic or blastic disease. Degenerative disc disease T7 through T10. CT ABDOMEN PELVIS FINDINGS Hepatobiliary: The liver enhances homogeneously. The gallbladder is physiologically distended without calculus. No biliary dilatation noted. Pancreas: No enhancing pancreatic mass or ductal dilatation. No inflammation. There appears to be mild side branch ductal ectasia of the pancreatic tail. Tiny side-branch intraductal papillary mucinous neoplasms are not entirely excluded, series 2/54. Spleen: Normal size.  No enhancing mass. Adrenals/Urinary Tract: Normal bilateral adrenal glands. 2.2 cm upper pole right renal cyst, simple in appearance with smaller subcentimeter hypodense lesions of both kidneys statistically consistent with cysts in the interpolar aspect of both kidneys and right lower pole. No nephrolithiasis, enhancing renal mass nor obstructive uropathy. The urinary bladder demonstrates no calculus or focal mural thickening. No focal mass. Stomach/Bowel: Small hiatal hernia. Decompressed stomach. Normal small bowel rotation is noted. No small-bowel dilatation or mass. No inflammation. The distal terminal ileum are unremarkable. No findings of acute appendicitis. Scattered colonic diverticulosis without acute diverticulitis. No annular constricting lesions. Moderate fecal retention within the descending and sigmoid colon. Vascular/Lymphatic: Mild-to-moderate aortic atherosclerosis. No aneurysm or adenopathy. Reproductive: Hysterectomy.  No adnexal mass. Other: No free air nor free fluid. Musculoskeletal: Degenerative disc disease  L4-5. No aggressive osteolytic or blastic disease. Multilevel degenerative facet arthropathy of the lumbar spine. Sclerosis about the pubic symphysis and both SI joints. Lumbosacral transitional vertebral anatomy with pseudoarticulation of L5 with S1 on the left. IMPRESSION: Chest CT: 1. Multinodular goiter. 2. Coronary arteriosclerosis and aortic atherosclerosis. No aneurysm or acute pulmonary embolus. 3. No pulmonary lesions. 4. Minimal subpleural areas of bilateral atelectasis and/or fibrosis. CT AP: 1. Tiny cystic foci in the pancreatic tail may represent ectatic pancreatic duct side branches. Side branch IPMN might also be a possibility. 2. Bilateral renal cysts, the largest measuring 2.2 cm in the upper pole. The remainder are too small to further characterize in the interpolar aspect of both kidneys and right lower pole. 3. Scattered colonic diverticulosis without acute diverticulitis. Masslike abnormality identified. Electronically Signed   By: Ashley Royalty M.D.   On: 08/02/2017 19:16  Dg Knee Complete 4 Views Right  Result Date: 08/02/2017 CLINICAL DATA:  Fall in bathroom with right knee pain. EXAM: RIGHT KNEE - COMPLETE 4+ VIEW COMPARISON:  None. FINDINGS: Mild diffuse osteopenia. Mild tricompartmental osteoarthritic change worse over the lateral compartment. No evidence of acute fracture or dislocation. Small joint effusion is present. IMPRESSION: No acute fracture. Small joint effusion. Mild tricompartmental osteoarthritis. Electronically Signed   By: Marin Olp M.D.   On: 08/02/2017 08:45   Dg Hip Unilat W Or Wo Pelvis 2-3 Views Left  Result Date: 08/02/2017 CLINICAL DATA:  Fall in bathroom with left-sided pain. EXAM: DG HIP (WITH OR WITHOUT PELVIS) 2-3V LEFT COMPARISON:  07/22/2017 FINDINGS: Mild diffuse osteopenia. Mild symmetric degenerative change of the hips. No evidence of acute fracture or dislocation. There are degenerative changes of the spine and sacroiliac joints. IMPRESSION: No  acute fracture. Electronically Signed   By: Marin Olp M.D.   On: 08/02/2017 08:49    Assessment/Plan  #1 hyperkalemia- she did receive a Kayexalate at one point and it did normalize- subsequent lab showed high normal at 5.1-this has been discussed with Dr. Lyndel Safe and will update this next week  Clinically she appears to be stable.  2.  Depression- patient is in agreement that she feels somewhat depressed- nursing staff is noted this as well and is understandable with her condition-will start low-dose Zoloft 25 mg a day for 1 week and then increase to 50 mg a day Continue to monitor.  3  HTN- she is on Coreg as well as Norvasc- appears at times to have some variable blood pressures but no consistent elevations at this point   will monitor  #4 history of brain lesion--she is not thought to be a good surgical candidate she is on dexamethasone.  At this point continue to monitor she is under hospice care comfort appears to be controlled but will have to be watched.  5.  History of type 2 diabetes diet-controlled this appears stable blood sugars have been largely in the lower to mid 100s.  6.  History of leukocytosis--thought to be steroid related white count was 19,800 on lab done 4 days ago which actually is down from around 26,000 she does not have fever chills increased chest congestion or cough or significant dysuria   [phone]

## 2017-09-01 ENCOUNTER — Encounter (HOSPITAL_COMMUNITY)
Admission: RE | Admit: 2017-09-01 | Discharge: 2017-09-01 | Disposition: A | Discharge status: Home / Self Care | Payer: Medicare Other | Source: Skilled Nursing Facility | Attending: Internal Medicine | Admitting: Internal Medicine

## 2017-09-01 ENCOUNTER — Non-Acute Institutional Stay (SKILLED_NURSING_FACILITY): Payer: Medicare Other | Admitting: Internal Medicine

## 2017-09-01 ENCOUNTER — Encounter: Payer: Self-pay | Admitting: Internal Medicine

## 2017-09-01 DIAGNOSIS — E038 Other specified hypothyroidism: Secondary | ICD-10-CM | POA: Diagnosis not present

## 2017-09-01 DIAGNOSIS — E118 Type 2 diabetes mellitus with unspecified complications: Secondary | ICD-10-CM | POA: Diagnosis not present

## 2017-09-01 DIAGNOSIS — E213 Hyperparathyroidism, unspecified: Secondary | ICD-10-CM | POA: Insufficient documentation

## 2017-09-01 DIAGNOSIS — G939 Disorder of brain, unspecified: Secondary | ICD-10-CM

## 2017-09-01 DIAGNOSIS — E039 Hypothyroidism, unspecified: Secondary | ICD-10-CM | POA: Diagnosis not present

## 2017-09-01 DIAGNOSIS — M545 Low back pain, unspecified: Secondary | ICD-10-CM

## 2017-09-01 DIAGNOSIS — C713 Malignant neoplasm of parietal lobe: Secondary | ICD-10-CM | POA: Insufficient documentation

## 2017-09-01 DIAGNOSIS — I10 Essential (primary) hypertension: Secondary | ICD-10-CM

## 2017-09-01 DIAGNOSIS — E119 Type 2 diabetes mellitus without complications: Secondary | ICD-10-CM | POA: Diagnosis not present

## 2017-09-01 DIAGNOSIS — E212 Other hyperparathyroidism: Secondary | ICD-10-CM | POA: Diagnosis not present

## 2017-09-01 DIAGNOSIS — M549 Dorsalgia, unspecified: Secondary | ICD-10-CM | POA: Insufficient documentation

## 2017-09-01 DIAGNOSIS — R531 Weakness: Secondary | ICD-10-CM | POA: Diagnosis not present

## 2017-09-01 LAB — CBC WITH DIFFERENTIAL/PLATELET
Basophils Absolute: 0 10*3/uL (ref 0.0–0.1)
Basophils Relative: 0 %
EOS ABS: 0 10*3/uL (ref 0.0–0.7)
EOS PCT: 0 %
HCT: 35.6 % — ABNORMAL LOW (ref 36.0–46.0)
Hemoglobin: 11.5 g/dL — ABNORMAL LOW (ref 12.0–15.0)
Lymphocytes Relative: 7 %
Lymphs Abs: 1.5 10*3/uL (ref 0.7–4.0)
MCH: 31.5 pg (ref 26.0–34.0)
MCHC: 32.3 g/dL (ref 30.0–36.0)
MCV: 97.5 fL (ref 78.0–100.0)
Monocytes Absolute: 0.8 10*3/uL (ref 0.1–1.0)
Monocytes Relative: 4 %
Neutro Abs: 19.5 10*3/uL — ABNORMAL HIGH (ref 1.7–7.7)
Neutrophils Relative %: 89 %
PLATELETS: 98 10*3/uL — AB (ref 150–400)
RBC: 3.65 MIL/uL — AB (ref 3.87–5.11)
RDW: 14.6 % (ref 11.5–15.5)
WBC: 21.8 10*3/uL — AB (ref 4.0–10.5)

## 2017-09-01 LAB — BASIC METABOLIC PANEL
ANION GAP: 5 (ref 5–15)
BUN: 47 mg/dL — ABNORMAL HIGH (ref 6–20)
CO2: 29 mmol/L (ref 22–32)
Calcium: 10 mg/dL (ref 8.9–10.3)
Chloride: 108 mmol/L (ref 101–111)
Creatinine, Ser: 0.6 mg/dL (ref 0.44–1.00)
GFR calc Af Amer: >60 mL/min (ref >60)
Glucose, Bld: 108 mg/dL — ABNORMAL HIGH (ref 65–99)
POTASSIUM: 4.9 mmol/L (ref 3.5–5.1)
Sodium: 142 mmol/L (ref 135–145)

## 2017-09-01 NOTE — Progress Notes (Signed)
Location:   [facility] Room Number: 155/P Place of Service:  SNF (31) Provider:  Clydene Pugh, MD  Patient Care Team: Kathyrn Drown, MD as PCP - General (Family Medicine)  Extended Emergency Contact Information Primary Emergency Contact: Scaife,Cynthia Marks Address: [address]          [place], [place] [address] [name] of [place] Phone: [phone] Mobile Phone: [phone] Relation: Spouse Secondary Emergency Contact: cliborne,kathy Home Phone: [phone] Relation: Sister  Code Status:  DNR Goals of care: Advanced Directive information Advanced Directives 09/01/2017  Does Patient Have a Medical Advance Directive? Yes  Type of Advance Directive Out of facility DNR (pink MOST or yellow form)  Does patient want to make changes to medical advance directive? No - Patient declined  Copy of Landa in Chart? -  Would patient like information on creating a medical advance directive? No - Patient declined  Pre-existing out of facility DNR order (yellow form or pink MOST form) -     Chief Complaint  Patient presents with  . Acute Visit    Patiet c/o Increased pain in back and weakness    HPI:  Pt is a 84 y.o. female seen today for an acute visit for Pain in the Back. Patient has also noticed that she is feeling weak on Left Side. Patient has h/o Hypertension, Hypercalcemia with Hyperparathyroidism followed by Dr Dorris Fetch, CKD disease Stage 3 , Hypothyroid, And Macular Degeneration.  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 She has been evaluated by Neurosurgery and thought to be not candidate for any Aggressive therapy and family has decided her to be hospice.  Patient recently has noticed increased weakness in her Left Upper and Lower Extremity. She denies any Head ache , Nausea or Vomiting. Also c/o Pain in lower back. She is also having anxiety though  controlled on Ativan PRN. Also C/O Constipation   Past Medical History:  Diagnosis Date  . Arthritis    Knees  . Chronic back pain   . Chronic pain of right knee   . Diabetes mellitus   . Gait abnormality   . Hypertension   . Macular degeneration   . Thyroid disease    Past Surgical History:  Procedure Laterality Date  . ABDOMINAL HYSTERECTOMY    . COLONOSCOPY    . SHOULDER SURGERY      Allergies  Allergen Reactions  . Aspirin   . Ibuprofen     Nausea or HTN  . Penicillins Rash    Has patient had a PCN reaction causing immediate rash, facial/tongue/throat swelling, SOB or lightheadedness with hypotension: unknown Has patient had a PCN reaction causing severe rash involving mucus membranes or skin necrosis: unknown Has patient had a PCN reaction that required hospitalization: no Has patient had a PCN reaction occurring within the last 10 years: No If all of the above answers are "NO", then may proceed with Cephalosporin use.     Outpatient Encounter Medications as of 09/01/2017  Medication Sig  . acetaminophen (TYLENOL) 650 MG CR tablet Take 650 mg by mouth every 8 (eight) hours as needed for pain.   Marland Kitchen amLODipine (NORVASC) 2.5 MG tablet TAKE 1 TABLET BY MOUTH EVERY DAY  . carvedilol (COREG) 6.25 MG tablet Take 6.25 mg by mouth 2 (two) times daily with a meal.  . Cholecalciferol 5000 units capsule Take 5,000 Units by mouth daily.  Marland Kitchen dexamethasone (  DECADRON) 6 MG tablet Take 3 mg by mouth 2 (two) times daily with a meal.  . levETIRAcetam (KEPPRA) 500 MG tablet Take 1 tablet (500 mg total) by mouth 2 (two) times daily.  Marland Kitchen levothyroxine (SYNTHROID, LEVOTHROID) 150 MCG tablet Take 2 tablets on mon, wed, and Friday. Take one and a half tablets on tues, thurs, sat, and sun  . LORazepam (ATIVAN) 0.5 MG tablet Give 1/2 tablet (0.25 mg ) by mouth every morning for persistent agitation and anxiety  . LORazepam (ATIVAN) 0.5 MG tablet Take 1 tablet (0.5 mg total) by mouth at bedtime.  Give 1 tablet by mouth at bedtime for agitation and anxiety or depression.  . Multiple Vitamins-Minerals (MULTI-VITAMIN/MINERALS PO) Take by mouth. Give 1 tablet by mouth once a day  . polyethylene glycol (MIRALAX / GLYCOLAX) packet Take 17 g by mouth as needed.   . polyethylene glycol (MIRALAX / GLYCOLAX) packet Take 17 g by mouth daily.  . sertraline (ZOLOFT) 25 MG tablet Take 25 mg by mouth daily. Take from 08/29/2017-09/06/2017  . sertraline (ZOLOFT) 50 MG tablet Take 50 mg by mouth daily. Starting 08/17/2017   No facility-administered encounter medications on file as of 09/01/2017.      Review of Systems  Constitutional: Positive for activity change and fatigue.  HENT: Negative.   Respiratory: Negative.   Cardiovascular: Negative.   Gastrointestinal: Positive for constipation.  Genitourinary: Negative.   Musculoskeletal: Positive for back pain.  Skin: Negative.   Neurological: Positive for weakness.  Psychiatric/Behavioral: The patient is nervous/anxious.     Immunization History  Administered Date(s) Administered  . Influenza Split 11/30/2012  . Influenza,inj,Quad PF,6+ Mos 11/30/2014, 01/09/2017  . Influenza-Unspecified 01/17/2012, 12/21/2013  . Pneumococcal Conjugate-13 10/08/2013  . Pneumococcal Polysaccharide-23 10/24/2015  . Td 03/20/2011   Pertinent  Health Maintenance Due  Topic Date Due  . FOOT EXAM  09/06/2017 (Originally 03/12/2017)  . OPHTHALMOLOGY EXAM  09/06/2017 (Originally 08/01/2017)  . URINE MICROALBUMIN  09/06/2017 (Originally 09/07/2015)  . INFLUENZA VACCINE  10/16/2017  . HEMOGLOBIN A1C  12/13/2017  . DEXA SCAN  Completed  . PNA vac Low Risk Adult  Completed   Fall Risk  06/24/2017 09/30/2016 06/05/2015 11/30/2014 07/06/2013  Falls in the past year? No No No No No  Risk for fall due to : Impaired balance/gait;Impaired mobility;History of fall(s);Medication side effect - - - -   Functional Status Survey:    Vitals:   09/01/17 0927  BP: (!) 155/74    Pulse: (!) 54  Resp: 18  Temp: 97.6 F (36.4 C)  TempSrc: Oral   There is no height or weight on file to calculate BMI. Physical Exam  Constitutional: She is oriented to person, place, and time. She appears well-developed and well-nourished.  HENT:  Head: Normocephalic.  Mouth/Throat: Oropharynx is clear and moist.  Eyes: Pupils are equal, round, and reactive to light.  Neck: Neck supple.  Cardiovascular: Normal rate and regular rhythm.  No murmur heard. Pulmonary/Chest: Effort normal and breath sounds normal. No stridor. No respiratory distress. She has no wheezes.  Abdominal: Soft. Bowel sounds are normal. She exhibits no distension. There is no tenderness. There is no guarding.  Musculoskeletal:  Mild edema Bilateral  Lymphadenopathy:    She has no cervical adenopathy.  Neurological: She is alert and oriented to person, place, and time.  Has 3/5 Strength in Left UE and LE 4/5 in Right UE and LE  Skin: Skin is warm and dry.  Psychiatric: She has a normal mood and  affect. Her behavior is normal. Thought content normal.    Labs reviewed: Recent Labs    10/07/16 1119  08/03/17 0649 08/04/17 0529 08/14/2017 0436  08/21/17 0718 08/25/17 0758 08/27/17 0741  NA  --    < > 140 140 139   < > 139 140 139  K  --    < > 4.4 4.5 4.5   < > 5.6* 4.8 5.1  CL  --    < > 106 108 109   < > 107 109 106  CO2  --    < > 24 24 24    < > 29 26 27   GLUCOSE  --    < > 90 97 151*   < > 101* 98 114*  BUN  --    < > 25* 20 24*   < > 51* 47* 52*  CREATININE  --    < > 0.97 0.91 0.93   < > 0.83 0.74 0.71  CALCIUM 10.2   < > 10.2 9.6 9.8   < > 10.1 9.7 9.8  MG 1.9  --  1.9 1.9 2.1  --   --   --   --   PHOS 3.0  --   --   --   --   --   --   --   --    < > = values in this interval not displayed.   Recent Labs    08/04/17 0529 07/16/2017 0436 08/14/17 0726  AST 16 18 26   ALT 9* 9* 26  ALKPHOS 57 64 59  BILITOT 0.5 0.5 0.7  PROT 6.8 7.4 6.5  ALBUMIN 3.1* 3.4* 3.2*   Recent Labs     08/03/17 0649 08/02/2017 0436 08/07/17 0744 08/18/17 0700 08/25/17 0758  WBC 9.6 8.2 16.6* 26.1* 19.8*  NEUTROABS 7.3 7.3  --  23.9*  --   HGB 11.3* 11.8* 11.3* 12.1 11.3*  HCT 35.9* 37.6 36.1 37.1 34.2*  MCV 97.6 97.7 97.0 95.9 95.0  PLT 267 289 312 145* 76*   Lab Results  Component Value Date   TSH 3.140 08/02/2017   Lab Results  Component Value Date   HGBA1C 5.3 06/12/2017   Lab Results  Component Value Date   CHOL 147 05/09/2016   HDL 36 (L) 05/09/2016   LDLCALC 92 05/09/2016   TRIG 95 05/09/2016   CHOLHDL 4.1 05/09/2016    Significant Diagnostic Results in last 30 days:  Ct Chest W Contrast  Result Date: 08/02/2017 CLINICAL DATA:  Initial workup due to presumed CNS metastasis. EXAM: CT CHEST, ABDOMEN, AND PELVIS WITH CONTRAST TECHNIQUE: Multidetector CT imaging of the chest, abdomen and pelvis was performed following the standard protocol during bolus administration of intravenous contrast. CONTRAST:  75 cc ISOVUE-300 IOPAMIDOL (ISOVUE-300) INJECTION 61%, 37mL ISOVUE-300 IOPAMIDOL (ISOVUE-300) INJECTION 61% COMPARISON:  08/02/2017 head CT, chest radiograph 05/28/2017 FINDINGS: CT CHEST FINDINGS Cardiovascular: Retroesophageal course of the internal carotid arteries bilaterally. Conventional branch pattern of the great vessels without significant stenosis. Ectatic ascending thoracic aorta to 3.7 cm. No thoracic aortic dissection. Minimal coronary arteriosclerosis along left main and proximal LAD. No acute pulmonary embolus to the segmental level. Heart size is borderline enlarged without pericardial effusion. Mediastinum/Nodes: Thyroid goiter with coarse calcifications within the left lobe. Subcentimeter cystic nodules are identified the lower poles of both lobes. Patent midline trachea and mainstem bronchi. No mediastinal hilar lymphadenopathy. Lungs/Pleura: No dominant mass or pulmonary consolidation. Multilobar bilateral subpleural areas of atelectasis and/or scarring. Mild  peribronchial  thickening within both lower lobes with subpleural areas of interstitial prominence and/or fibrosis in the lower lobes bilaterally. Small paraseptal foci of emphysema along the periphery of the right middle and both lower lobes. No effusion or pneumothorax. Musculoskeletal: Subchondral cystic change of both humeral heads and glenoid fossae. No aggressive osteolytic or blastic disease. Degenerative disc disease T7 through T10. CT ABDOMEN PELVIS FINDINGS Hepatobiliary: The liver enhances homogeneously. The gallbladder is physiologically distended without calculus. No biliary dilatation noted. Pancreas: No enhancing pancreatic mass or ductal dilatation. No inflammation. There appears to be mild side branch ductal ectasia of the pancreatic tail. Tiny side-branch intraductal papillary mucinous neoplasms are not entirely excluded, series 2/54. Spleen: Normal size.  No enhancing mass. Adrenals/Urinary Tract: Normal bilateral adrenal glands. 2.2 cm upper pole right renal cyst, simple in appearance with smaller subcentimeter hypodense lesions of both kidneys statistically consistent with cysts in the interpolar aspect of both kidneys and right lower pole. No nephrolithiasis, enhancing renal mass nor obstructive uropathy. The urinary bladder demonstrates no calculus or focal mural thickening. No focal mass. Stomach/Bowel: Small hiatal hernia. Decompressed stomach. Normal small bowel rotation is noted. No small-bowel dilatation or mass. No inflammation. The distal terminal ileum are unremarkable. No findings of acute appendicitis. Scattered colonic diverticulosis without acute diverticulitis. No annular constricting lesions. Moderate fecal retention within the descending and sigmoid colon. Vascular/Lymphatic: Mild-to-moderate aortic atherosclerosis. No aneurysm or adenopathy. Reproductive: Hysterectomy.  No adnexal mass. Other: No free air nor free fluid. Musculoskeletal: Degenerative disc disease L4-5. No  aggressive osteolytic or blastic disease. Multilevel degenerative facet arthropathy of the lumbar spine. Sclerosis about the pubic symphysis and both SI joints. Lumbosacral transitional vertebral anatomy with pseudoarticulation of L5 with S1 on the left. IMPRESSION: Chest CT: 1. Multinodular goiter. 2. Coronary arteriosclerosis and aortic atherosclerosis. No aneurysm or acute pulmonary embolus. 3. No pulmonary lesions. 4. Minimal subpleural areas of bilateral atelectasis and/or fibrosis. CT AP: 1. Tiny cystic foci in the pancreatic tail may represent ectatic pancreatic duct side branches. Side branch IPMN might also be a possibility. 2. Bilateral renal cysts, the largest measuring 2.2 cm in the upper pole. The remainder are too small to further characterize in the interpolar aspect of both kidneys and right lower pole. 3. Scattered colonic diverticulosis without acute diverticulitis. Masslike abnormality identified. Electronically Signed   By: Ashley Royalty M.D.   On: 08/02/2017 19:16   Mr Jodene Nam Head Wo Contrast  Result Date: 08/04/2017 CLINICAL DATA:  Recent fall. Seizure. Abnormal neuro exam. Abnormal CT of the head. EXAM: MRI HEAD WITHOUT CONTRAST MRA HEAD WITHOUT CONTRAST TECHNIQUE: Multiplanar, multiecho pulse sequences of the brain and surrounding structures were obtained without intravenous contrast. Angiographic images of the head were obtained using MRA technique without contrast. COMPARISON:  CT head without contrast 08/02/2017. FINDINGS: MRI HEAD FINDINGS Brain: A mass lesion in the medial right parietal lobe measures at least 2.9 x 2.0 x 3.7 cm. There is significant surrounding vasogenic edema. This effaces the sulci and has some mass effect on the right lateral ventricle. There is no hydrocephalus. Restricted diffusion is evident within the lesion and scattered throughout the adjacent white matter. Additional cortical restricted diffusion is present separate from the measured mass lesion. Abnormal T2  signal extends into the splenium of the corpus callosum. There is additional signal surrounding the atrium of the right lateral ventricle. No acute hemorrhage is present. Mild periventricular white matter changes are present otherwise. No significant extra-axial fluid collection is present. The internal auditory canals are within normal  limits bilaterally. The brainstem is normal. Remote lacunar infarcts are present in the inferior cerebellum bilaterally. No acute abnormality is present. Vascular: Flow is present in the major intracranial arteries. Skull and upper cervical spine: The skull base is within normal limits. The craniocervical junction is normal. Sinuses/Orbits: The paranasal sinuses and mastoid air cells are clear. MRA HEAD FINDINGS Atherosclerotic irregularity is present within the cavernous internal carotid arteries bilaterally. There is mild narrowing of approximately 50% at the ophthalmic segment on the right. ICA termini are intact. A moderate proximal right A1 segment stenosis is present. A moderate proximal left M1 segment stenosis is present. The anterior communicating artery is patent. There is asymmetric attenuation of the distal right M1 segment and right MCA branch vessels compared to the left. The vertebral arteries are codominant. PICA origins are not visualized. Vertebrobasilar junction is normal. Both posterior cerebral arteries originate from basilar tip. There is signal loss in the PCA branches bilaterally. IMPRESSION: 1. Infiltrative mass lesion in the medial right parietal lobe measures at least 2.9 x 2.0 x 3.7 cm. 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 MRI with contrast could be used for further evaluation if clinically indicated. T2 signal changes separate from the measured lesion likely represent infiltrative disease is well. 2. Mass effect from the primary  lesion and surrounding vasogenic edema without hydrocephalus or downward herniation. 3. Mild stenosis involving the ophthalmic segment of the right internal carotid artery. 4. Moderate right A1 and left M1 proximal stenoses. 5. Asymmetric attenuation of the distal right M1 segment and right MCA branch vessels compared to the left. Electronically Signed   By: San Morelle M.D.   On: 08/04/2017 08:57   Mr Brain Wo Contrast  Result Date: 08/04/2017 CLINICAL DATA:  Recent fall. Seizure. Abnormal neuro exam. Abnormal CT of the head. EXAM: MRI HEAD WITHOUT CONTRAST MRA HEAD WITHOUT CONTRAST TECHNIQUE: Multiplanar, multiecho pulse sequences of the brain and surrounding structures were obtained without intravenous contrast. Angiographic images of the head were obtained using MRA technique without contrast. COMPARISON:  CT head without contrast 08/02/2017. FINDINGS: MRI HEAD FINDINGS Brain: A mass lesion in the medial right parietal lobe measures at least 2.9 x 2.0 x 3.7 cm. There is significant surrounding vasogenic edema. This effaces the sulci and has some mass effect on the right lateral ventricle. There is no hydrocephalus. Restricted diffusion is evident within the lesion and scattered throughout the adjacent white matter. Additional cortical restricted diffusion is present separate from the measured mass lesion. Abnormal T2 signal extends into the splenium of the corpus callosum. There is additional signal surrounding the atrium of the right lateral ventricle. No acute hemorrhage is present. Mild periventricular white matter changes are present otherwise. No significant extra-axial fluid collection is present. The internal auditory canals are within normal limits bilaterally. The brainstem is normal. Remote lacunar infarcts are present in the inferior cerebellum bilaterally. No acute abnormality is present. Vascular: Flow is present in the major intracranial arteries. Skull and upper cervical spine: The  skull base is within normal limits. The craniocervical junction is normal. Sinuses/Orbits: The paranasal sinuses and mastoid air cells are clear. MRA HEAD FINDINGS Atherosclerotic irregularity is present within the cavernous internal carotid arteries bilaterally. There is mild narrowing of approximately 50% at the ophthalmic segment on the right. ICA termini are intact. A moderate proximal right A1 segment stenosis is present. A moderate proximal left M1 segment stenosis  is present. The anterior communicating artery is patent. There is asymmetric attenuation of the distal right M1 segment and right MCA branch vessels compared to the left. The vertebral arteries are codominant. PICA origins are not visualized. Vertebrobasilar junction is normal. Both posterior cerebral arteries originate from basilar tip. There is signal loss in the PCA branches bilaterally. IMPRESSION: 1. Infiltrative mass lesion in the medial right parietal lobe measures at least 2.9 x 2.0 x 3.7 cm. 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 MRI with contrast could be used for further evaluation if clinically indicated. T2 signal changes separate from the measured lesion likely represent infiltrative disease is well. 2. Mass effect from the primary lesion and surrounding vasogenic edema without hydrocephalus or downward herniation. 3. Mild stenosis involving the ophthalmic segment of the right internal carotid artery. 4. Moderate right A1 and left M1 proximal stenoses. 5. Asymmetric attenuation of the distal right M1 segment and right MCA branch vessels compared to the left. Electronically Signed   By: San Morelle M.D.   On: 08/04/2017 08:57   Mr Brain W Contrast  Result Date: 08/04/2017 CLINICAL DATA:  Altered mental status for 2 days. Follow-up evaluation. EXAM: MRI HEAD WITH CONTRAST TECHNIQUE: Multiplanar,  multiecho pulse sequences of the brain and surrounding structures were obtained with intravenous contrast. CONTRAST:  62mL MULTIHANCE GADOBENATE DIMEGLUMINE 529 MG/ML IV SOLN COMPARISON:  MRI of the head without contrast Aug 04, 2017 at 0825 hours FINDINGS: Brain: 3 x 3.4 x 5.4 cm heterogeneously enhancing lobulated RIGHT mesial parietal lobe mass corresponding to previous abnormality. There 2 adjacent nodules within the splenium of the corpus callosum measuring to 18 x 8 mm. In addition, 4 mm nodular enhancement RIGHT inferior frontal cortex. No abnormal extra-axial enhancement. Vascular: Not tailored for evaluation. Skull and upper cervical spine: No abnormal osseous enhancement. Sinuses/Orbits: No abnormal enhancement. Other: None. IMPRESSION: 1. Four suspicious foci of intraparenchymal enhancement, dominant lesion RIGHT parietal lobe 3 x 3.4 x 5.4 cm. 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 Electronically Signed   By: Elon Alas M.D.   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 EXAM: CT CHEST, ABDOMEN, AND PELVIS WITH CONTRAST TECHNIQUE: Multidetector CT imaging of the chest, abdomen and pelvis was performed following the standard protocol during bolus administration of intravenous contrast. CONTRAST:  75 cc ISOVUE-300 IOPAMIDOL (ISOVUE-300) INJECTION 61%, 43mL ISOVUE-300 IOPAMIDOL (ISOVUE-300) INJECTION 61% COMPARISON:  08/02/2017 head CT, chest radiograph 05/28/2017 FINDINGS: CT CHEST FINDINGS Cardiovascular: Retroesophageal course of the internal carotid arteries bilaterally. Conventional branch pattern of the great vessels without significant stenosis. Ectatic ascending thoracic aorta to 3.7 cm. No thoracic aortic dissection. Minimal coronary arteriosclerosis along left main and proximal LAD. No acute pulmonary embolus to the segmental level. Heart size is borderline enlarged  without pericardial effusion. Mediastinum/Nodes: Thyroid goiter with coarse calcifications within the left lobe. Subcentimeter cystic nodules are identified the lower poles of both lobes. Patent midline trachea and mainstem bronchi. No mediastinal hilar lymphadenopathy. Lungs/Pleura: No dominant mass or pulmonary consolidation. Multilobar bilateral subpleural areas of atelectasis and/or scarring. Mild peribronchial thickening within both lower lobes with subpleural areas of interstitial prominence and/or fibrosis in the lower lobes bilaterally. Small paraseptal foci of emphysema along the periphery of the right middle and both lower lobes. No effusion or pneumothorax. Musculoskeletal: Subchondral cystic  change of both humeral heads and glenoid fossae. No aggressive osteolytic or blastic disease. Degenerative disc disease T7 through T10. CT ABDOMEN PELVIS FINDINGS Hepatobiliary: The liver enhances homogeneously. The gallbladder is physiologically distended without calculus. No biliary dilatation noted. Pancreas: No enhancing pancreatic mass or ductal dilatation. No inflammation. There appears to be mild side branch ductal ectasia of the pancreatic tail. Tiny side-branch intraductal papillary mucinous neoplasms are not entirely excluded, series 2/54. Spleen: Normal size.  No enhancing mass. Adrenals/Urinary Tract: Normal bilateral adrenal glands. 2.2 cm upper pole right renal cyst, simple in appearance with smaller subcentimeter hypodense lesions of both kidneys statistically consistent with cysts in the interpolar aspect of both kidneys and right lower pole. No nephrolithiasis, enhancing renal mass nor obstructive uropathy. The urinary bladder demonstrates no calculus or focal mural thickening. No focal mass. Stomach/Bowel: Small hiatal hernia. Decompressed stomach. Normal small bowel rotation is noted. No small-bowel dilatation or mass. No inflammation. The distal terminal ileum are unremarkable. No findings of  acute appendicitis. Scattered colonic diverticulosis without acute diverticulitis. No annular constricting lesions. Moderate fecal retention within the descending and sigmoid colon. Vascular/Lymphatic: Mild-to-moderate aortic atherosclerosis. No aneurysm or adenopathy. Reproductive: Hysterectomy.  No adnexal mass. Other: No free air nor free fluid. Musculoskeletal: Degenerative disc disease L4-5. No aggressive osteolytic or blastic disease. Multilevel degenerative facet arthropathy of the lumbar spine. Sclerosis about the pubic symphysis and both SI joints. Lumbosacral transitional vertebral anatomy with pseudoarticulation of L5 with S1 on the left. IMPRESSION: Chest CT: 1. Multinodular goiter. 2. Coronary arteriosclerosis and aortic atherosclerosis. No aneurysm or acute pulmonary embolus. 3. No pulmonary lesions. 4. Minimal subpleural areas of bilateral atelectasis and/or fibrosis. CT AP: 1. Tiny cystic foci in the pancreatic tail may represent ectatic pancreatic duct side branches. Side branch IPMN might also be a possibility. 2. Bilateral renal cysts, the largest measuring 2.2 cm in the upper pole. The remainder are too small to further characterize in the interpolar aspect of both kidneys and right lower pole. 3. Scattered colonic diverticulosis without acute diverticulitis. Masslike abnormality identified. Electronically Signed   By: Ashley Royalty M.D.   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 She says she has not had any other problems Leucocytosis Most Likely due to Steroids. No Source of infection  Essential hypertension BP  controlled on Coreg and Amlodipine. Constipation Incase the Miralax to BID Diabetes Her A1C was 5.3 in 03/19 Not on Any hypoglycemic  Hypothyroidism On Levothyroxine TSH normal in hospital.   Hyperkalemia with CKD Responded to [facility] Comfort Care under Hospice  Family/ staff Communication:   Labs/tests ordered:

## 2017-09-02 DIAGNOSIS — E039 Hypothyroidism, unspecified: Secondary | ICD-10-CM | POA: Diagnosis not present

## 2017-09-02 DIAGNOSIS — R531 Weakness: Secondary | ICD-10-CM | POA: Diagnosis not present

## 2017-09-02 DIAGNOSIS — E118 Type 2 diabetes mellitus with unspecified complications: Secondary | ICD-10-CM | POA: Diagnosis not present

## 2017-09-02 DIAGNOSIS — E212 Other hyperparathyroidism: Secondary | ICD-10-CM | POA: Diagnosis not present

## 2017-09-02 DIAGNOSIS — C713 Malignant neoplasm of parietal lobe: Secondary | ICD-10-CM | POA: Diagnosis not present

## 2017-09-02 DIAGNOSIS — I10 Essential (primary) hypertension: Secondary | ICD-10-CM | POA: Diagnosis not present

## 2017-09-03 ENCOUNTER — Encounter: Payer: Self-pay | Admitting: Internal Medicine

## 2017-09-03 ENCOUNTER — Non-Acute Institutional Stay (SKILLED_NURSING_FACILITY): Payer: Medicare Other | Admitting: Internal Medicine

## 2017-09-03 DIAGNOSIS — E212 Other hyperparathyroidism: Secondary | ICD-10-CM | POA: Diagnosis not present

## 2017-09-03 DIAGNOSIS — F329 Major depressive disorder, single episode, unspecified: Secondary | ICD-10-CM

## 2017-09-03 DIAGNOSIS — F32A Depression, unspecified: Secondary | ICD-10-CM

## 2017-09-03 DIAGNOSIS — R531 Weakness: Secondary | ICD-10-CM | POA: Diagnosis not present

## 2017-09-03 DIAGNOSIS — I10 Essential (primary) hypertension: Secondary | ICD-10-CM | POA: Diagnosis not present

## 2017-09-03 DIAGNOSIS — E118 Type 2 diabetes mellitus with unspecified complications: Secondary | ICD-10-CM | POA: Diagnosis not present

## 2017-09-03 DIAGNOSIS — R52 Pain, unspecified: Secondary | ICD-10-CM

## 2017-09-03 DIAGNOSIS — C713 Malignant neoplasm of parietal lobe: Secondary | ICD-10-CM | POA: Diagnosis not present

## 2017-09-03 DIAGNOSIS — E039 Hypothyroidism, unspecified: Secondary | ICD-10-CM | POA: Diagnosis not present

## 2017-09-03 NOTE — Progress Notes (Signed)
Location:   [facility] Room Number: 155/P Place of Service:  SNF (31) Provider:  Ewell Poe, MD  Patient Care Team: Kathyrn Drown, MD as PCP - General (Family Medicine)  Extended Emergency Contact Information Primary Emergency Contact: Vacca,[name] Address: [address]          [place], [place] [address] [name] of [place] Phone: [phone] Mobile Phone: [phone] Relation: Spouse Secondary Emergency Contact: cliborne,kathy Home Phone: [phone] Relation: Sister  Code Status:  DNR Goals of care: Advanced Directive information Advanced Directives 09/03/2017  Does Patient Have a Medical Advance Directive? Yes  Type of Advance Directive (No Data)  Does patient want to make changes to medical advance directive? No - Patient declined  Copy of [place] in Chart? -  Would patient like information on creating a medical advance directive? No - Patient declined  Pre-existing out of facility DNR order (yellow form or pink MOST form) -     Chief Complaint  Patient presents with  . Acute Visit    Patient is being seen for Pain Management    HPI:  Pt is a 84 y.o. female seen today for an acute visit for follow-up of pain management  Patient has h/o Hypertension, Hypercalcemia with Hyperparathyroidism followed by Dr Dorris Fetch, CKD disease Stage 3 , Hypothyroid, And Macular Degeneration.  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 She has been evaluated by Neurosurgery and thought to be not candidate for any Aggressive therapy and family has decided her to be hospice.   She continues on Decadron this was recently increased secondary to some increased left-sided weakness.  Nursing staff feels she has had some increased pain recently-she is on Norco 1 or 2 tabs every 6 hours as needed they feel she would benefit from dosing being more frequent.  Patient  appears to be in agreement with this.  Currently she is lying in bed comfortably vital signs appear to be stable she has no acute complaints.  Appears to be feeling a bit better than when I last saw her she has been started on Zoloft for suspected depression she also continues on Ativan as needed for anxiety.  Currently main issue appears to be pain control     Past Medical History:  Diagnosis Date  . Arthritis    Knees  . Chronic back pain   . Chronic pain of right knee   . Diabetes mellitus   . Gait abnormality   . Hypertension   . Macular degeneration   . Thyroid disease    Past Surgical History:  Procedure Laterality Date  . ABDOMINAL HYSTERECTOMY    . COLONOSCOPY    . SHOULDER SURGERY      Allergies  Allergen Reactions  . Aspirin   . Ibuprofen     Nausea or HTN  . Penicillins Rash    Has patient had a PCN reaction causing immediate rash, facial/tongue/throat swelling, SOB or lightheadedness with hypotension: unknown Has patient had a PCN reaction causing severe rash involving mucus membranes or skin necrosis: unknown Has patient had a PCN reaction that required hospitalization: no Has patient had a PCN reaction occurring within the last 10 years: No If all of the above answers are "NO", then may proceed with Cephalosporin use.     Outpatient Encounter Medications as of 09/03/2017  Medication Sig  . acetaminophen (TYLENOL) 650 MG CR tablet Take 650 mg by  mouth every 8 (eight) hours as needed for pain.   Marland Kitchen amLODipine (NORVASC) 2.5 MG tablet TAKE 1 TABLET BY MOUTH EVERY DAY  . carvedilol (COREG) 6.25 MG tablet Take 6.25 mg by mouth 2 (two) times daily with a meal.  . dexamethasone (DECADRON) 4 MG tablet Take 4 mg by mouth 2 (two) times daily with a meal.  . HYDROcodone-acetaminophen (NORCO/VICODIN) 5-325 MG tablet Take 1 tablet by mouth every 4 (four) hours as needed for moderate pain.  Marland Kitchen HYDROcodone-acetaminophen (NORCO/VICODIN) 5-325 MG tablet Take 1 tablet by  mouth every 4 (four) hours as needed for severe pain.  Marland Kitchen levETIRAcetam (KEPPRA) 500 MG tablet Take 1 tablet (500 mg total) by mouth 2 (two) times daily.  Marland Kitchen levothyroxine (SYNTHROID, LEVOTHROID) 150 MCG tablet Take 2 tablets on mon, wed, and Friday. Take one and a half tablets on tues, thurs, sat, and sun  . levothyroxine (SYNTHROID, LEVOTHROID) 150 MCG tablet Take 1.5 mcg tabs (225 mcg ) by mouth on Sun., Tues., Thurs., Sat.  Marland Kitchen LORazepam (ATIVAN) 0.5 MG tablet Give 1/2 tablet (0.25 mg ) by mouth every morning for persistent agitation and anxiety  . LORazepam (ATIVAN) 0.5 MG tablet Take 1 tablet (0.5 mg total) by mouth at bedtime. Give 1 tablet by mouth at bedtime for agitation and anxiety or depression.  Marland Kitchen LORazepam (ATIVAN) 0.5 MG tablet Take 0.25 mg by mouth every 6 (six) hours as needed for anxiety.  . Multiple Vitamins-Minerals (MULTI-VITAMIN/MINERALS PO) Take by mouth. Give 1 tablet by mouth once a day  . ondansetron (ZOFRAN) 4 MG tablet Take 4 mg by mouth every 6 (six) hours as needed for nausea or vomiting.  . polyethylene glycol (MIRALAX / GLYCOLAX) packet Take 17 g by mouth daily.  . sertraline (ZOLOFT) 25 MG tablet Take 25 mg by mouth daily. Take from 08/29/2017-09/06/2017  . sertraline (ZOLOFT) 50 MG tablet Take 50 mg by mouth daily. Starting 09/06/2017  . [DISCONTINUED] Cholecalciferol 5000 units capsule Take 5,000 Units by mouth daily.  . [DISCONTINUED] dexamethasone (DECADRON) 6 MG tablet Take 4 mg by mouth 2 (two) times daily with a meal.   . [DISCONTINUED] polyethylene glycol (MIRALAX / GLYCOLAX) packet Take 17 g by mouth as needed.    No facility-administered encounter medications on file as of 09/03/2017.     Review of Systems   In general she is not complaining of fever chills.  Skin does not complain of rashes itching or diaphoresis.  Head ears eyes nose mouth or throat does not complain of sore throat or difficulty swallowing or visual changes.  Respiratory is not  complaining of shortness of breath or cough.  Cardiac is not complaining of chest pain has some mild lower extremity edema.  GI is not complaining of abdominal discomfort nausea or vomiting has had some constipation past but is not complaining of that today.  Musculoskeletal has complained of some back pain again we are increasing the frequency of Norco she also is on Decadron.  Neurologic does not complain of dizziness headache or syncope at this time does have some history of weakness more on the left versus the right.  And psych does appear to have an element of depression and anxiety but this appears to be improving with the Zoloft as well as the Ativan as needed.    Immunization History  Administered Date(s) Administered  . Influenza Split 11/30/2012  . Influenza,inj,Quad PF,6+ Mos 11/30/2014, 01/09/2017  . Influenza-Unspecified 01/17/2012, 12/21/2013  . Pneumococcal Conjugate-13 10/08/2013  . Pneumococcal Polysaccharide-23  10/24/2015  . Td 03/20/2011   Pertinent  Health Maintenance Due  Topic Date Due  . FOOT EXAM  09/06/2017 (Originally 03/12/2017)  . OPHTHALMOLOGY EXAM  09/06/2017 (Originally 08/01/2017)  . URINE MICROALBUMIN  09/06/2017 (Originally 09/07/2015)  . INFLUENZA VACCINE  10/16/2017  . HEMOGLOBIN A1C  12/13/2017  . DEXA SCAN  Completed  . PNA vac Low Risk Adult  Completed   Fall Risk  06/24/2017 09/30/2016 06/05/2015 11/30/2014 07/06/2013  Falls in the past year? No No No No No  Risk for fall due to : Impaired balance/gait;Impaired mobility;History of fall(s);Medication side effect - - - -   Functional Status Survey:    Vitals:   09/03/17 1255  BP: 138/66  Pulse: (!) 58  Resp: 16    Physical Exam   In general this is a very pleasant elderly female in no distress lying comfortably in bed she appears to be in better spirits than when I last saw her.  Her skin is warm and dry.  Eyes visual acuity appears to be intact sclera and conjunctive are  clear  Oropharynx is clear mucous membranes moist.  Chest is clear to auscultation there is no labored breathing.  Heart is regular rate and rhythm without murmur gallop or rub is slightly bradycardic in the high 50s.  She has mild lower extremity edema.  Abdomen is soft nontender with positive bowel sounds.  Musculoskeletal is able to move all extremities x4 with some mild left-sided weakness compared to the right.  Neurologic as noted above-her speech is clear  She is alert cranial nerves appear to be intact.   Psych-she is alert and oriented pleasant and appropriate  Labs reviewed: Recent Labs    10/07/16 1119  08/03/17 0649 08/04/17 0529 08/07/2017 0436  08/25/17 0758 08/27/17 0741 09/01/17 0708  NA  --    < > 140 140 139   < > 140 139 142  K  --    < > 4.4 4.5 4.5   < > 4.8 5.1 4.9  CL  --    < > 106 108 109   < > 109 106 108  CO2  --    < > 24 24 24    < > 26 27 29   GLUCOSE  --    < > 90 97 151*   < > 98 114* 108*  BUN  --    < > 25* 20 24*   < > 47* 52* 47*  CREATININE  --    < > 0.97 0.91 0.93   < > 0.74 0.71 0.60  CALCIUM 10.2   < > 10.2 9.6 9.8   < > 9.7 9.8 10.0  MG 1.9  --  1.9 1.9 2.1  --   --   --   --   PHOS 3.0  --   --   --   --   --   --   --   --    < > = values in this interval not displayed.   Recent Labs    08/04/17 0529 08/07/2017 0436 08/14/17 0726  AST 16 18 26   ALT 9* 9* 26  ALKPHOS 57 64 59  BILITOT 0.5 0.5 0.7  PROT 6.8 7.4 6.5  ALBUMIN 3.1* 3.4* 3.2*   Recent Labs    07/25/2017 0436  08/18/17 0700 08/25/17 0758 09/01/17 0708  WBC 8.2   < > 26.1* 19.8* 21.8*  NEUTROABS 7.3  --  23.9*  --  19.5*  HGB  11.8*   < > 12.1 11.3* 11.5*  HCT 37.6   < > 37.1 34.2* 35.6*  MCV 97.7   < > 95.9 95.0 97.5  PLT 289   < > 145* 76* 98*   < > = values in this interval not displayed.   Lab Results  Component Value Date   TSH 3.140 08/02/2017   Lab Results  Component Value Date   HGBA1C 5.3 06/12/2017   Lab Results  Component Value Date    CHOL 147 05/09/2016   HDL 36 (L) 05/09/2016   LDLCALC 92 05/09/2016   TRIG 95 05/09/2016   CHOLHDL 4.1 05/09/2016    Significant Diagnostic Results in last 30 days:  Mr Brain W Contrast  Result Date: 08/04/2017 CLINICAL DATA:  Altered mental status for 2 days. Follow-up evaluation. EXAM: MRI HEAD WITH CONTRAST TECHNIQUE: Multiplanar, multiecho pulse sequences of the brain and surrounding structures were obtained with intravenous contrast. CONTRAST:  68mL MULTIHANCE GADOBENATE DIMEGLUMINE 529 MG/ML IV SOLN COMPARISON:  MRI of the head without contrast Aug 04, 2017 at 0825 hours FINDINGS: Brain: 3 x 3.4 x 5.4 cm heterogeneously enhancing lobulated RIGHT mesial parietal lobe mass corresponding to previous abnormality. There 2 adjacent nodules within the splenium of the corpus callosum measuring to 18 x 8 mm. In addition, 4 mm nodular enhancement RIGHT inferior frontal cortex. No abnormal extra-axial enhancement. Vascular: Not tailored for evaluation. Skull and upper cervical spine: No abnormal osseous enhancement. Sinuses/Orbits: No abnormal enhancement. Other: None. IMPRESSION: 1. Four suspicious foci of intraparenchymal enhancement, dominant lesion RIGHT parietal lobe 3 x 3.4 x 5.4 cm. 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 Electronically Signed   By: Elon Alas M.D.   On: 08/04/2017 18:50    Assessment/Plan  #1- pain management- will increase her Norco 1 or 2 tabs 5-325 mg to every 4 hours as needed and monitor- she does not appear to be in acute pain but certainly comfort care is the priority here.  2.  I have been asked by staff about liberalizing her diet she apparently is not eating that great and feels a lot of that is diet related will liberalize it so she can have some salt again emphasis is on comfort and quality of life.   #3 element of depression she has been started on Zoloft it appears this is probably helping she is also on Ativan for anxiety which  at this point appears controlled   [id number]

## 2017-09-08 DIAGNOSIS — E212 Other hyperparathyroidism: Secondary | ICD-10-CM | POA: Diagnosis not present

## 2017-09-08 DIAGNOSIS — I10 Essential (primary) hypertension: Secondary | ICD-10-CM | POA: Diagnosis not present

## 2017-09-08 DIAGNOSIS — E039 Hypothyroidism, unspecified: Secondary | ICD-10-CM | POA: Diagnosis not present

## 2017-09-08 DIAGNOSIS — R531 Weakness: Secondary | ICD-10-CM | POA: Diagnosis not present

## 2017-09-08 DIAGNOSIS — C713 Malignant neoplasm of parietal lobe: Secondary | ICD-10-CM | POA: Diagnosis not present

## 2017-09-08 DIAGNOSIS — E118 Type 2 diabetes mellitus with unspecified complications: Secondary | ICD-10-CM | POA: Diagnosis not present

## 2017-09-09 DIAGNOSIS — E212 Other hyperparathyroidism: Secondary | ICD-10-CM | POA: Diagnosis not present

## 2017-09-09 DIAGNOSIS — I10 Essential (primary) hypertension: Secondary | ICD-10-CM | POA: Diagnosis not present

## 2017-09-09 DIAGNOSIS — E118 Type 2 diabetes mellitus with unspecified complications: Secondary | ICD-10-CM | POA: Diagnosis not present

## 2017-09-09 DIAGNOSIS — E039 Hypothyroidism, unspecified: Secondary | ICD-10-CM | POA: Diagnosis not present

## 2017-09-09 DIAGNOSIS — C713 Malignant neoplasm of parietal lobe: Secondary | ICD-10-CM | POA: Diagnosis not present

## 2017-09-09 DIAGNOSIS — R531 Weakness: Secondary | ICD-10-CM | POA: Diagnosis not present

## 2017-09-10 ENCOUNTER — Non-Acute Institutional Stay (SKILLED_NURSING_FACILITY): Payer: Medicare Other | Admitting: Internal Medicine

## 2017-09-10 ENCOUNTER — Encounter: Payer: Self-pay | Admitting: Internal Medicine

## 2017-09-10 DIAGNOSIS — R52 Pain, unspecified: Secondary | ICD-10-CM

## 2017-09-10 DIAGNOSIS — F419 Anxiety disorder, unspecified: Secondary | ICD-10-CM | POA: Diagnosis not present

## 2017-09-10 DIAGNOSIS — G939 Disorder of brain, unspecified: Secondary | ICD-10-CM

## 2017-09-10 DIAGNOSIS — C713 Malignant neoplasm of parietal lobe: Secondary | ICD-10-CM | POA: Diagnosis not present

## 2017-09-10 DIAGNOSIS — R531 Weakness: Secondary | ICD-10-CM | POA: Diagnosis not present

## 2017-09-10 DIAGNOSIS — E118 Type 2 diabetes mellitus with unspecified complications: Secondary | ICD-10-CM | POA: Diagnosis not present

## 2017-09-10 DIAGNOSIS — I10 Essential (primary) hypertension: Secondary | ICD-10-CM | POA: Diagnosis not present

## 2017-09-10 DIAGNOSIS — H1011 Acute atopic conjunctivitis, right eye: Secondary | ICD-10-CM

## 2017-09-10 DIAGNOSIS — E212 Other hyperparathyroidism: Secondary | ICD-10-CM | POA: Diagnosis not present

## 2017-09-10 DIAGNOSIS — E039 Hypothyroidism, unspecified: Secondary | ICD-10-CM | POA: Diagnosis not present

## 2017-09-10 NOTE — Progress Notes (Signed)
Location:   [facility] Room Number: 155/P Place of Service:  SNF (31) Provider:  Ewell Poe, MD  Patient Care Team: Kathyrn Drown, MD as PCP - General (Family Medicine)  Extended Emergency Contact Information Primary Emergency Contact: Seidl,[name] Address: [address]          [place], [place] [address] [name] of [place] Phone: [phone] Mobile Phone: [phone] Relation: Spouse Secondary Emergency Contact: cliborne,kathy Home Phone: [phone] Relation: Sister  Code Status:  DNR Goals of care: Advanced Directive information Advanced Directives 09/10/2017  Does Patient Have a Medical Advance Directive? Yes  Type of Advance Directive (No Data)  Does patient want to make changes to medical advance directive? No - Patient declined  Copy of [place] in Chart? -  Would patient like information on creating a medical advance directive? No - Patient declined  Pre-existing out of facility DNR order (yellow form or pink MOST form) -     Chief Complaint  Patient presents with  . Acute Visit    Patient is being seen for Right Eye Issues    HPI:  Pt is a 84 y.o. female seen today for an acute visit for some puffiness around her right eye.  There is been no history of trauma falls etc. apparently it was first noticed this morning after she had been sleeping-apparently she does favor her right side when sleeping  Patient has h/o Hypertension, Hypercalcemia with Hyperparathyroidism followed by Dr Dorris Fetch, CKD disease Stage 3 , Hypothyroid, And Macular Degeneration.  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 She has been evaluated by Neurosurgery and thought to be not candidate for any Aggressive therapy and family has decided her to be hospice.  She appears to be having some continued left-sided weakness she is on Decadron  For pain management she is  on routine Norco as well as Roxanol as needed for pain not relieved with the Norco- according to patient and family this is working quite well  She is also been started on Ativan for anxiety as well as Zoloft for suspected depression- this appears to be helping  In regards to the eye issues she is not complaining of any visual changes or soreness- feels she is having some clear drainage however from the right eye and again has some puffiness around the eye    Past Medical History:  Diagnosis Date  . Arthritis    Knees  . Chronic back pain   . Chronic pain of right knee   . Diabetes mellitus   . Gait abnormality   . Hypertension   . Macular degeneration   . Thyroid disease    Past Surgical History:  Procedure Laterality Date  . ABDOMINAL HYSTERECTOMY    . COLONOSCOPY    . SHOULDER SURGERY      Allergies  Allergen Reactions  . Aspirin   . Ibuprofen     Nausea or HTN  . Penicillins Rash    Has patient had a PCN reaction causing immediate rash, facial/tongue/throat swelling, SOB or lightheadedness with hypotension: unknown Has patient had a PCN reaction causing severe rash involving mucus membranes or skin necrosis: unknown Has patient had a PCN reaction that required hospitalization: no Has patient had a PCN reaction occurring within the last 10 years: No If all of the above answers are "NO", then may proceed with Cephalosporin use.     Outpatient Encounter  Medications as of 09/10/2017  Medication Sig  . acetaminophen (TYLENOL) 650 MG CR tablet Take 650 mg by mouth every 8 (eight) hours as needed for pain.   Marland Kitchen amLODipine (NORVASC) 2.5 MG tablet TAKE 1 TABLET BY MOUTH EVERY DAY  . carvedilol (COREG) 6.25 MG tablet Take 6.25 mg by mouth 2 (two) times daily with a meal.  . dexamethasone (DECADRON) 4 MG tablet Take 4 mg by mouth 2 (two) times daily with a meal.  . HYDROcodone-acetaminophen (NORCO/VICODIN) 5-325 MG tablet Take 1 tablet by mouth every 4 (four) hours as needed for  moderate pain.  Marland Kitchen levETIRAcetam (KEPPRA) 500 MG tablet Take 1 tablet (500 mg total) by mouth 2 (two) times daily.  Marland Kitchen levothyroxine (SYNTHROID, LEVOTHROID) 150 MCG tablet Take 2 tablets on mon, wed, and Friday. Take one and a half tablets on tues, thurs, sat, and sun  . levothyroxine (SYNTHROID, LEVOTHROID) 150 MCG tablet Take 1.5 mcg tabs (225 mcg ) by mouth on Sun., Tues., Thurs., Sat.  Marland Kitchen LORazepam (ATIVAN) 0.5 MG tablet Give 1/2 tablet (0.25 mg ) by mouth every morning for persistent agitation and anxiety  . LORazepam (ATIVAN) 0.5 MG tablet Take 1 tablet (0.5 mg total) by mouth at bedtime. Give 1 tablet by mouth at bedtime for agitation and anxiety or depression.  Marland Kitchen LORazepam (ATIVAN) 0.5 MG tablet Take 0.25 mg by mouth every 6 (six) hours as needed for anxiety.  Marland Kitchen morphine (ROXANOL) 20 MG/ML concentrated solution Give 0.25 ml by mouth every 4 hours as needed for pain not relieved by Hydrocodone  . Multiple Vitamins-Minerals (MULTI-VITAMIN/MINERALS PO) Take by mouth. Give 1 tablet by mouth once a day  . ondansetron (ZOFRAN) 4 MG tablet Take 4 mg by mouth every 8 (eight) hours as needed for nausea or vomiting.   . polyethylene glycol (MIRALAX / GLYCOLAX) packet Take 17 g by mouth daily.  . sertraline (ZOLOFT) 50 MG tablet Take 50 mg by mouth daily. Starting 09/06/2017  . [DISCONTINUED] HYDROcodone-acetaminophen (NORCO/VICODIN) 5-325 MG tablet Take 1 tablet by mouth every 4 (four) hours as needed for severe pain.  . [DISCONTINUED] sertraline (ZOLOFT) 25 MG tablet Take 25 mg by mouth daily. Take from 08/29/2017-09/06/2017   No facility-administered encounter medications on file as of 09/10/2017.     Review of Systems   In general she is not complaining of fever chills pain appears to be controlled.  Skin is not complain of rashes or itching.  Head ears eyes nose mouth and throat- does not complain of  Pain--has some puffiness and drainage from her right eye  Respiratory does not complain of  shortness of breath or cough  Cardiac is not complaining of chest pain  GI is not complaining of abdominal pain or vomiting diarrhea constipation-says she has a pretty good appetite  Musculoskeletal- past has complained of back pain  In the past-- but apparently current pain medications are helping  Neurologic-does not complain of dizziness headache or syncope at this time does have continued left-sided weakness  Psych--appears to be in fairly good spirits does not appear anxious or depressed  Immunization History  Administered Date(s) Administered  . Influenza Split 11/30/2012  . Influenza,inj,Quad PF,6+ Mos 11/30/2014, 01/09/2017  . Influenza-Unspecified 01/17/2012, 12/21/2013  . Pneumococcal Conjugate-13 10/08/2013  . Pneumococcal Polysaccharide-23 10/24/2015  . Td 03/20/2011   Pertinent  Health Maintenance Due  Topic Date Due  . FOOT EXAM  10/10/2017 (Originally 03/12/2017)  . OPHTHALMOLOGY EXAM  10/10/2017 (Originally 08/01/2017)  . URINE MICROALBUMIN  10/10/2017 (  Originally 09/07/2015)  . INFLUENZA VACCINE  10/16/2017  . HEMOGLOBIN A1C  12/13/2017  . DEXA SCAN  Completed  . PNA vac Low Risk Adult  Completed   Fall Risk  06/24/2017 09/30/2016 06/05/2015 11/30/2014 07/06/2013  Falls in the past year? No No No No No  Risk for fall due to : Impaired balance/gait;Impaired mobility;History of fall(s);Medication side effect - - - -   Functional Status Survey:      Physical Exam   In general this is a pleasant elderly female in no distress lying comfortably in bed  Skin is warm and dry.  Eyes- left eye appears unremarkable sclera and conjunctive are clear visual acuity appears to be intact.  Right eye sclera and conjunctive are clear there appears to be possibly some slight clear drainage- there is some puffiness around right eye the eyelid orbital area- there does not appear to be tenderness or warmth-- visual acuity appears to be intact chest is clear to auscultation there is  no labored breathing.  Heart is regular rate and rhythm without murmur gallop or rub she has mild lower extremity edema  Chest is clear to auscultation there is no labored breathing  Abdomen is soft nontender with positive bowel sounds  Musculoskeletal has significant left-sided weakness does move her right sided extremities at baseline.  Neurologic as noted above speech is clear.  Psych she is alert and oriented pleasant and appropriate    Labs reviewed: Recent Labs    10/07/16 1119  08/03/17 0649 08/04/17 0529 08/14/2017 0436  08/25/17 0758 08/27/17 0741 09/01/17 0708  NA  --    < > 140 140 139   < > 140 139 142  K  --    < > 4.4 4.5 4.5   < > 4.8 5.1 4.9  CL  --    < > 106 108 109   < > 109 106 108  CO2  --    < > 24 24 24    < > 26 27 29   GLUCOSE  --    < > 90 97 151*   < > 98 114* 108*  BUN  --    < > 25* 20 24*   < > 47* 52* 47*  CREATININE  --    < > 0.97 0.91 0.93   < > 0.74 0.71 0.60  CALCIUM 10.2   < > 10.2 9.6 9.8   < > 9.7 9.8 10.0  MG 1.9  --  1.9 1.9 2.1  --   --   --   --   PHOS 3.0  --   --   --   --   --   --   --   --    < > = values in this interval not displayed.   Recent Labs    08/04/17 0529 08/15/2017 0436 08/14/17 0726  AST 16 18 26   ALT 9* 9* 26  ALKPHOS 57 64 59  BILITOT 0.5 0.5 0.7  PROT 6.8 7.4 6.5  ALBUMIN 3.1* 3.4* 3.2*   Recent Labs    08/04/2017 0436  08/18/17 0700 08/25/17 0758 09/01/17 0708  WBC 8.2   < > 26.1* 19.8* 21.8*  NEUTROABS 7.3  --  23.9*  --  19.5*  HGB 11.8*   < > 12.1 11.3* 11.5*  HCT 37.6   < > 37.1 34.2* 35.6*  MCV 97.7   < > 95.9 95.0 97.5  PLT 289   < > 145* 76* 98*   < > =  values in this interval not displayed.   Lab Results  Component Value Date   TSH 3.140 08/02/2017   Lab Results  Component Value Date   HGBA1C 5.3 06/12/2017   Lab Results  Component Value Date   CHOL 147 05/09/2016   HDL 36 (L) 05/09/2016   LDLCALC 92 05/09/2016   TRIG 95 05/09/2016   CHOLHDL 4.1 05/09/2016    Significant  Diagnostic Results in last 30 days:  No results found.  Assessment/Plan  #1- right eye issues- at this point will treat for possible allergies with antihistamine drop Patanol 1 drop twice daily for 7 days and right eye-and continue to monitor if puffiness or drainage persists notify provider.  Cannot rule out there is possibly some slight dependent edema here since she appears to favor her right side somewhat when sleeping.  2.  Pain management this appears under good control now with the routine Norco and as needed Roxanol to need to monitor.  3.  Anxiety depression-this appears improved as well on the Zoloft as well as a routine Ativan twice daily no oversedation has been noted to my knowledge-  #4-history of malignant lesion of the right parietal lobe- continues under hospice care to have significant left-sided weakness- her Decadron has recently been increased- at this point emphasis is on comfort at this point appears comfortable  [id number]-of note greater than 25 minutes spent assessing patient- reviewing her chart- discussing her status with family at bedside- coordinating a plan of care

## 2017-09-12 ENCOUNTER — Other Ambulatory Visit: Payer: Self-pay

## 2017-09-12 MED ORDER — HYDROCODONE-ACETAMINOPHEN 5-325 MG PO TABS: 1.0000 | ORAL_TABLET | ORAL | 0 refills | Status: DC | PRN

## 2017-09-12 NOTE — Telephone Encounter (Signed)
RX Fax for [facility]@ [phone]  

## 2017-09-13 ENCOUNTER — Other Ambulatory Visit: Payer: Self-pay | Admitting: Family Medicine

## 2017-09-15 DIAGNOSIS — R531 Weakness: Secondary | ICD-10-CM | POA: Diagnosis not present

## 2017-09-15 DIAGNOSIS — E039 Hypothyroidism, unspecified: Secondary | ICD-10-CM | POA: Diagnosis not present

## 2017-09-15 DIAGNOSIS — E212 Other hyperparathyroidism: Secondary | ICD-10-CM | POA: Diagnosis not present

## 2017-09-15 DIAGNOSIS — R1310 Dysphagia, unspecified: Secondary | ICD-10-CM | POA: Diagnosis not present

## 2017-09-15 DIAGNOSIS — C713 Malignant neoplasm of parietal lobe: Secondary | ICD-10-CM | POA: Diagnosis not present

## 2017-09-15 DIAGNOSIS — E118 Type 2 diabetes mellitus with unspecified complications: Secondary | ICD-10-CM | POA: Diagnosis not present

## 2017-09-15 DIAGNOSIS — R231 Pallor: Secondary | ICD-10-CM | POA: Diagnosis not present

## 2017-09-15 DIAGNOSIS — I10 Essential (primary) hypertension: Secondary | ICD-10-CM | POA: Diagnosis not present

## 2017-09-15 NOTE — Telephone Encounter (Signed)
Please send pharmacy a message that she is in a nursing home

## 2017-09-16 DIAGNOSIS — E039 Hypothyroidism, unspecified: Secondary | ICD-10-CM | POA: Diagnosis not present

## 2017-09-16 DIAGNOSIS — R531 Weakness: Secondary | ICD-10-CM | POA: Diagnosis not present

## 2017-09-16 DIAGNOSIS — C713 Malignant neoplasm of parietal lobe: Secondary | ICD-10-CM | POA: Diagnosis not present

## 2017-09-16 DIAGNOSIS — E118 Type 2 diabetes mellitus with unspecified complications: Secondary | ICD-10-CM | POA: Diagnosis not present

## 2017-09-16 DIAGNOSIS — E212 Other hyperparathyroidism: Secondary | ICD-10-CM | POA: Diagnosis not present

## 2017-09-16 DIAGNOSIS — I10 Essential (primary) hypertension: Secondary | ICD-10-CM | POA: Diagnosis not present

## 2017-09-17 DIAGNOSIS — I10 Essential (primary) hypertension: Secondary | ICD-10-CM | POA: Diagnosis not present

## 2017-09-17 DIAGNOSIS — R531 Weakness: Secondary | ICD-10-CM | POA: Diagnosis not present

## 2017-09-17 DIAGNOSIS — E212 Other hyperparathyroidism: Secondary | ICD-10-CM | POA: Diagnosis not present

## 2017-09-17 DIAGNOSIS — E039 Hypothyroidism, unspecified: Secondary | ICD-10-CM | POA: Diagnosis not present

## 2017-09-17 DIAGNOSIS — C713 Malignant neoplasm of parietal lobe: Secondary | ICD-10-CM | POA: Diagnosis not present

## 2017-09-17 DIAGNOSIS — E118 Type 2 diabetes mellitus with unspecified complications: Secondary | ICD-10-CM | POA: Diagnosis not present

## 2017-09-22 DIAGNOSIS — C713 Malignant neoplasm of parietal lobe: Secondary | ICD-10-CM | POA: Diagnosis not present

## 2017-09-22 DIAGNOSIS — E118 Type 2 diabetes mellitus with unspecified complications: Secondary | ICD-10-CM | POA: Diagnosis not present

## 2017-09-22 DIAGNOSIS — E039 Hypothyroidism, unspecified: Secondary | ICD-10-CM | POA: Diagnosis not present

## 2017-09-22 DIAGNOSIS — E212 Other hyperparathyroidism: Secondary | ICD-10-CM | POA: Diagnosis not present

## 2017-09-22 DIAGNOSIS — I10 Essential (primary) hypertension: Secondary | ICD-10-CM | POA: Diagnosis not present

## 2017-09-22 DIAGNOSIS — R531 Weakness: Secondary | ICD-10-CM | POA: Diagnosis not present

## 2017-09-23 ENCOUNTER — Non-Acute Institutional Stay (SKILLED_NURSING_FACILITY): Payer: Medicare Other | Admitting: Internal Medicine

## 2017-09-23 ENCOUNTER — Encounter: Payer: Self-pay | Admitting: Internal Medicine

## 2017-09-23 DIAGNOSIS — R531 Weakness: Secondary | ICD-10-CM | POA: Diagnosis not present

## 2017-09-23 DIAGNOSIS — C713 Malignant neoplasm of parietal lobe: Secondary | ICD-10-CM | POA: Diagnosis not present

## 2017-09-23 DIAGNOSIS — G939 Disorder of brain, unspecified: Secondary | ICD-10-CM | POA: Diagnosis not present

## 2017-09-23 DIAGNOSIS — Z515 Encounter for palliative care: Secondary | ICD-10-CM

## 2017-09-23 DIAGNOSIS — E039 Hypothyroidism, unspecified: Secondary | ICD-10-CM | POA: Diagnosis not present

## 2017-09-23 DIAGNOSIS — E212 Other hyperparathyroidism: Secondary | ICD-10-CM | POA: Diagnosis not present

## 2017-09-23 DIAGNOSIS — R52 Pain, unspecified: Secondary | ICD-10-CM | POA: Diagnosis not present

## 2017-09-23 DIAGNOSIS — I10 Essential (primary) hypertension: Secondary | ICD-10-CM | POA: Diagnosis not present

## 2017-09-23 DIAGNOSIS — E118 Type 2 diabetes mellitus with unspecified complications: Secondary | ICD-10-CM | POA: Diagnosis not present

## 2017-09-23 NOTE — Progress Notes (Signed)
Location:   [facility] Room Number: 155/P Place of Service:  SNF [phone]) Provider:  Veleta Miners MD  Kathyrn Drown, MD  Patient Care Team: Kathyrn Drown, MD as PCP - General (Family Medicine)  Extended Emergency Contact Information Primary Emergency Contact: Manville,[name] Address: [address]          [place], [place] [address] [name] of [place] Phone: [phone] Mobile Phone: [phone] Relation: Spouse Secondary Emergency Contact: cliborne,kathy Home Phone: [phone] Relation: Sister  Code Status:  DNR Goals of care: Advanced Directive information Advanced Directives 09/23/2017  Does Patient Have a Medical Advance Directive? Yes  Type of Advance Directive (No Data)  Does patient want to make changes to medical advance directive? No - Patient declined  Copy of [place] in Chart? -  Would patient like information on creating a medical advance directive? No - Patient declined  Pre-existing out of facility DNR order (yellow form or pink MOST form) -     Chief Complaint  Patient presents with  . Acute Visit    Patient is being seen for Pain Management    HPI:  Pt is a 84 y.o. female seen today for an acute visit for Pain Management  Patient has h/o Hypertension, Hypercalcemia with Hyperparathyroidism followed by Dr Dorris Fetch, CKD disease Stage 3 , Hypothyroid, And Macular Degeneration. 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 She has been evaluated by Neurosurgery and thought to be not candidate for any Aggressive therapy or Biopsy and family has decided her to be hospice. Patient was seen today as per request by the family and nurses that she continues to have pain in her low back.  Per family when she gets morphine she gets very sedated and confused and they were requesting if we can back off on her as needed dose.  They will also wanted to know if we can increase  her Norco for better coverage. Patient continues to be very weak on left upper and lower extremity and now she is unable to move bedside.  She said her back pain is worse. Her anxiety is controlled on Ativan as needed She is moving her bowels well with MiraLAX. Past Medical History:  Diagnosis Date  . Arthritis    Knees  . Chronic back pain   . Chronic pain of right knee   . Diabetes mellitus   . Gait abnormality   . Hypertension   . Macular degeneration   . Thyroid disease    Past Surgical History:  Procedure Laterality Date  . ABDOMINAL HYSTERECTOMY    . COLONOSCOPY    . SHOULDER SURGERY      Allergies  Allergen Reactions  . Aspirin   . Ibuprofen     Nausea or HTN  . Penicillins Rash    Has patient had a PCN reaction causing immediate rash, facial/tongue/throat swelling, SOB or lightheadedness with hypotension: unknown Has patient had a PCN reaction causing severe rash involving mucus membranes or skin necrosis: unknown Has patient had a PCN reaction that required hospitalization: no Has patient had a PCN reaction occurring within the last 10 years: No If all of the above answers are "NO", then may proceed with Cephalosporin use.     Outpatient Encounter Medications as of 09/23/2017  Medication Sig  . acetaminophen (TYLENOL) 650 MG CR tablet Take 650 mg by mouth every 8 (eight) hours as needed for pain.   Marland Kitchen amLODipine (  NORVASC) 2.5 MG tablet TAKE 1 TABLET BY MOUTH EVERY DAY  . carvedilol (COREG) 6.25 MG tablet Take 6.25 mg by mouth 2 (two) times daily with a meal.  . dexamethasone (DECADRON) 4 MG tablet Take 4 mg by mouth 2 (two) times daily with a meal.  . HYDROcodone-acetaminophen (NORCO) 10-325 MG tablet Take 1 tablet by mouth every 4 (four) hours.  . levETIRAcetam (KEPPRA) 500 MG tablet Take 1 tablet (500 mg total) by mouth 2 (two) times daily.  Marland Kitchen levothyroxine (SYNTHROID, LEVOTHROID) 150 MCG tablet Take 2 tablets on mon, wed, and Friday. Take one and a half tablets  on tues, thurs, sat, and sun  . levothyroxine (SYNTHROID, LEVOTHROID) 150 MCG tablet Take 1.5 mcg tabs (225 mcg ) by mouth on Sun., Tues., Thurs., Sat.  Marland Kitchen LORazepam (ATIVAN) 0.5 MG tablet Give 1/2 tablet (0.25 mg ) by mouth every morning for persistent agitation and anxiety  . LORazepam (ATIVAN) 0.5 MG tablet Take 1 tablet (0.5 mg total) by mouth at bedtime. Give 1 tablet by mouth at bedtime for agitation and anxiety or depression.  Marland Kitchen morphine (ROXANOL) 20 MG/ML concentrated solution Give 0.25 ml by mouth every 4 hours as needed for pain not relieved by Hydrocodone  . Multiple Vitamins-Minerals (MULTI-VITAMIN/MINERALS PO) Take by mouth. Give 1 tablet by mouth once a day  . ondansetron (ZOFRAN) 4 MG tablet Take 4 mg by mouth every 8 (eight) hours as needed for nausea or vomiting.   . polyethylene glycol (MIRALAX / GLYCOLAX) packet Take 17 g by mouth daily.  . sertraline (ZOLOFT) 50 MG tablet Take 50 mg by mouth daily. Starting 09/06/2017  . [DISCONTINUED] HYDROcodone-acetaminophen (NORCO/VICODIN) 5-325 MG tablet Take 1 tablet by mouth every 4 (four) hours as needed for moderate pain.  . [DISCONTINUED] LORazepam (ATIVAN) 0.5 MG tablet Take 0.25 mg by mouth every 6 (six) hours as needed for anxiety.   No facility-administered encounter medications on file as of 09/23/2017.      Review of Systems  Constitutional: Positive for activity change and appetite change.  HENT: Negative.   Respiratory: Negative.   Cardiovascular: Negative.   Gastrointestinal: Negative.   Genitourinary: Negative.   Musculoskeletal: Positive for back pain.  Skin: Negative.   Neurological: Positive for weakness.  Psychiatric/Behavioral: Negative.     Immunization History  Administered Date(s) Administered  . Influenza Split 11/30/2012  . Influenza,inj,Quad PF,6+ Mos 11/30/2014, 01/09/2017  . Influenza-Unspecified 01/17/2012, 12/21/2013  . Pneumococcal Conjugate-13 10/08/2013  . Pneumococcal Polysaccharide-23  10/24/2015  . Td 03/20/2011   Pertinent  Health Maintenance Due  Topic Date Due  . FOOT EXAM  10/10/2017 (Originally 03/12/2017)  . OPHTHALMOLOGY EXAM  10/10/2017 (Originally 08/01/2017)  . URINE MICROALBUMIN  10/10/2017 (Originally 09/07/2015)  . INFLUENZA VACCINE  10/16/2017  . HEMOGLOBIN A1C  12/13/2017  . DEXA SCAN  Completed  . PNA vac Low Risk Adult  Completed   Fall Risk  06/24/2017 09/30/2016 06/05/2015 11/30/2014 07/06/2013  Falls in the past year? No No No No No  Risk for fall due to : Impaired balance/gait;Impaired mobility;History of fall(s);Medication side effect - - - -   Functional Status Survey:    There were no vitals filed for this visit. There is no height or weight on file to calculate BMI. Physical Exam  Constitutional: She appears well-developed.  HENT:  Head: Normocephalic.  Mouth/Throat: Oropharynx is clear and moist.  Eyes: Pupils are equal, round, and reactive to light.  Neck: Neck supple.  Cardiovascular: Normal rate and regular rhythm.  Pulmonary/Chest: Effort normal and breath sounds normal.  Abdominal: Soft. Bowel sounds are normal. She exhibits no distension. There is no tenderness.  Musculoskeletal:  Moderate Edema Bilateral  Neurological: She is alert.  Cannot move her Left side  Skin: Skin is warm and dry.  Psychiatric: She has a normal mood and affect. Her behavior is normal. Thought content normal.    Labs reviewed: Recent Labs    10/07/16 1119  08/03/17 0649 08/04/17 0529 08/09/2017 0436  08/25/17 0758 08/27/17 0741 09/01/17 0708  NA  --    < > 140 140 139   < > 140 139 142  K  --    < > 4.4 4.5 4.5   < > 4.8 5.1 4.9  CL  --    < > 106 108 109   < > 109 106 108  CO2  --    < > 24 24 24    < > 26 27 29   GLUCOSE  --    < > 90 97 151*   < > 98 114* 108*  BUN  --    < > 25* 20 24*   < > 47* 52* 47*  CREATININE  --    < > 0.97 0.91 0.93   < > 0.74 0.71 0.60  CALCIUM 10.2   < > 10.2 9.6 9.8   < > 9.7 9.8 10.0  MG 1.9  --  1.9 1.9 2.1  --    --   --   --   PHOS 3.0  --   --   --   --   --   --   --   --    < > = values in this interval not displayed.   Recent Labs    08/04/17 0529 07/31/2017 0436 08/14/17 0726  AST 16 18 26   ALT 9* 9* 26  ALKPHOS 57 64 59  BILITOT 0.5 0.5 0.7  PROT 6.8 7.4 6.5  ALBUMIN 3.1* 3.4* 3.2*   Recent Labs    07/22/2017 0436  08/18/17 0700 08/25/17 0758 09/01/17 0708  WBC 8.2   < > 26.1* 19.8* 21.8*  NEUTROABS 7.3  --  23.9*  --  19.5*  HGB 11.8*   < > 12.1 11.3* 11.5*  HCT 37.6   < > 37.1 34.2* 35.6*  MCV 97.7   < > 95.9 95.0 97.5  PLT 289   < > 145* 76* 98*   < > = values in this interval not displayed.   Lab Results  Component Value Date   TSH 3.140 08/02/2017   Lab Results  Component Value Date   HGBA1C 5.3 06/12/2017   Lab Results  Component Value Date   CHOL 147 05/09/2016   HDL 36 (L) 05/09/2016   LDLCALC 92 05/09/2016   TRIG 95 05/09/2016   CHOLHDL 4.1 05/09/2016    Significant Diagnostic Results in last 30 days:  No results found.  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 She says she has not had any other problems Continue for now Essential hypertension Patient now has Hypotension Discontinue Norvasc and Coreg Constipation Continue Miralax Diabetes Comfort Care no Accu checks  Hypothyroidism On Levothyroxine TSH normal in hospital.  [facility] under Hospice  Family/ staff Communication:   Labs/tests ordered:

## 2017-09-24 ENCOUNTER — Other Ambulatory Visit: Payer: Self-pay

## 2017-09-24 DIAGNOSIS — I10 Essential (primary) hypertension: Secondary | ICD-10-CM | POA: Diagnosis not present

## 2017-09-24 DIAGNOSIS — E118 Type 2 diabetes mellitus with unspecified complications: Secondary | ICD-10-CM | POA: Diagnosis not present

## 2017-09-24 DIAGNOSIS — E212 Other hyperparathyroidism: Secondary | ICD-10-CM | POA: Diagnosis not present

## 2017-09-24 DIAGNOSIS — R531 Weakness: Secondary | ICD-10-CM | POA: Diagnosis not present

## 2017-09-24 DIAGNOSIS — E039 Hypothyroidism, unspecified: Secondary | ICD-10-CM | POA: Diagnosis not present

## 2017-09-24 DIAGNOSIS — C713 Malignant neoplasm of parietal lobe: Secondary | ICD-10-CM | POA: Diagnosis not present

## 2017-09-24 MED ORDER — MORPHINE SULFATE (CONCENTRATE) 20 MG/ML PO SOLN: 5.0000 mg | ORAL | 0 refills | Status: DC | PRN

## 2017-09-24 NOTE — Telephone Encounter (Signed)
RX Fax for [facility]@ [phone]  

## 2017-09-29 DIAGNOSIS — C713 Malignant neoplasm of parietal lobe: Secondary | ICD-10-CM | POA: Diagnosis not present

## 2017-09-29 DIAGNOSIS — R531 Weakness: Secondary | ICD-10-CM | POA: Diagnosis not present

## 2017-09-29 DIAGNOSIS — E118 Type 2 diabetes mellitus with unspecified complications: Secondary | ICD-10-CM | POA: Diagnosis not present

## 2017-09-29 DIAGNOSIS — E039 Hypothyroidism, unspecified: Secondary | ICD-10-CM | POA: Diagnosis not present

## 2017-09-29 DIAGNOSIS — I10 Essential (primary) hypertension: Secondary | ICD-10-CM | POA: Diagnosis not present

## 2017-09-29 DIAGNOSIS — E212 Other hyperparathyroidism: Secondary | ICD-10-CM | POA: Diagnosis not present

## 2017-09-30 DIAGNOSIS — I10 Essential (primary) hypertension: Secondary | ICD-10-CM | POA: Diagnosis not present

## 2017-09-30 DIAGNOSIS — E212 Other hyperparathyroidism: Secondary | ICD-10-CM | POA: Diagnosis not present

## 2017-09-30 DIAGNOSIS — R531 Weakness: Secondary | ICD-10-CM | POA: Diagnosis not present

## 2017-09-30 DIAGNOSIS — C713 Malignant neoplasm of parietal lobe: Secondary | ICD-10-CM | POA: Diagnosis not present

## 2017-09-30 DIAGNOSIS — E118 Type 2 diabetes mellitus with unspecified complications: Secondary | ICD-10-CM | POA: Diagnosis not present

## 2017-09-30 DIAGNOSIS — E039 Hypothyroidism, unspecified: Secondary | ICD-10-CM | POA: Diagnosis not present

## 2017-10-01 ENCOUNTER — Encounter: Payer: Self-pay | Admitting: Internal Medicine

## 2017-10-01 ENCOUNTER — Non-Acute Institutional Stay (SKILLED_NURSING_FACILITY): Payer: Medicare Other | Admitting: Internal Medicine

## 2017-10-01 DIAGNOSIS — E039 Hypothyroidism, unspecified: Secondary | ICD-10-CM | POA: Diagnosis not present

## 2017-10-01 DIAGNOSIS — I10 Essential (primary) hypertension: Secondary | ICD-10-CM

## 2017-10-01 DIAGNOSIS — R52 Pain, unspecified: Secondary | ICD-10-CM

## 2017-10-01 DIAGNOSIS — R531 Weakness: Secondary | ICD-10-CM | POA: Diagnosis not present

## 2017-10-01 DIAGNOSIS — E212 Other hyperparathyroidism: Secondary | ICD-10-CM | POA: Diagnosis not present

## 2017-10-01 DIAGNOSIS — E118 Type 2 diabetes mellitus with unspecified complications: Secondary | ICD-10-CM | POA: Diagnosis not present

## 2017-10-01 DIAGNOSIS — C713 Malignant neoplasm of parietal lobe: Secondary | ICD-10-CM | POA: Diagnosis not present

## 2017-10-01 DIAGNOSIS — R569 Unspecified convulsions: Secondary | ICD-10-CM

## 2017-10-01 DIAGNOSIS — G939 Disorder of brain, unspecified: Secondary | ICD-10-CM

## 2017-10-01 NOTE — Progress Notes (Signed)
Location:   [facility] Room Number: 155/P Place of Service:  SNF (31) Provider:  Ewell Poe, MD  Patient Care Team: Kathyrn Drown, MD as PCP - General (Family Medicine)  Extended Emergency Contact Information Primary Emergency Contact: Prindle,[name] Address: [address]          [place], [place] [address] [name] of [place] Phone: [phone] Mobile Phone: [phone] Relation: Spouse Secondary Emergency Contact: cliborne,kathy Home Phone: [phone] Relation: Sister  Code Status:  DNR Goals of care: Advanced Directive information Advanced Directives 10/01/2017  Does Patient Have a Medical Advance Directive? Yes  Type of Advance Directive Out of facility DNR (pink MOST or yellow form)  Does patient want to make changes to medical advance directive? No - Patient declined  Copy of [place] in Chart? -  Would patient like information on creating a medical advance directive? No - Patient declined  Pre-existing out of facility DNR order (yellow form or pink MOST form) -     Chief Complaint  Patient presents with  . Acute Visit    Patient is being seen for F/U Pain Management    HPI:  Pt is a 84 y.o. female seen today for an acute visit for follow-up of pain management. Patient has h/o Hypertension, Hypercalcemia with Hyperparathyroidism followed by Dr Dorris Fetch, CKD disease Stage 3 , Hypothyroid, And Macular Degeneration. 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 She has been evaluated by Neurosurgery and thought to be not candidate for any Aggressive therapy or Biopsy and family has decided her to be hospice.  She was seen last week by Dr. Lyndel Safe because family nurses thought she was having continued pain in her low back.  Also there were concerns that morphine she became too sedated and confused.  Adjustments were made including increasing her Norco  for better coverage   andreducing her PRN Roxanol down to 2.5 mg  She continues on Roxanol 5 mg at night  Continues to have significant weakness on the left upper and lower extremities and has some weakness on the right as well.  Apparently her pain is better controlled she is not complaining of pain today and nursing does not report any issues.  Continues on Ativan for anxiety.  Currently she is resting in bed comfortably appears to be increasingly weak however   Past Medical History:  Diagnosis Date  . Arthritis    Knees  . Chronic back pain   . Chronic pain of right knee   . Diabetes mellitus   . Gait abnormality   . Hypertension   . Macular degeneration   . Thyroid disease    Past Surgical History:  Procedure Laterality Date  . ABDOMINAL HYSTERECTOMY    . COLONOSCOPY    . SHOULDER SURGERY      Allergies  Allergen Reactions  . Aspirin   . Ibuprofen     Nausea or HTN  . Penicillins Rash    Has patient had a PCN reaction causing immediate rash, facial/tongue/throat swelling, SOB or lightheadedness with hypotension: unknown Has patient had a PCN reaction causing severe rash involving mucus membranes or skin necrosis: unknown Has patient had a PCN reaction that required hospitalization: no Has patient had a PCN reaction occurring within the last 10 years: No If all of the above answers are "NO", then may proceed with Cephalosporin use.     Outpatient Encounter Medications as of  10/01/2017  Medication Sig  . acetaminophen (TYLENOL) 650 MG CR tablet Take 650 mg by mouth every 8 (eight) hours as needed for pain.   Marland Kitchen dexamethasone (DECADRON) 4 MG tablet Take 4 mg by mouth 2 (two) times daily with a meal.  . HYDROcodone-acetaminophen (NORCO) 10-325 MG tablet Take 1 tablet by mouth every 4 (four) hours.  . levETIRAcetam (KEPPRA) 500 MG tablet Take 1 tablet (500 mg total) by mouth 2 (two) times daily.  Marland Kitchen levothyroxine (SYNTHROID, LEVOTHROID) 150 MCG tablet Take 2 tablets on  mon, wed, and Friday. Take one and a half tablets on tues, thurs, sat, and sun  . LORazepam (ATIVAN) 0.5 MG tablet Give 1/2 tablet (0.25 mg ) by mouth every morning for persistent agitation and anxiety  . LORazepam (ATIVAN) 0.5 MG tablet Take 1 tablet (0.5 mg total) by mouth at bedtime. Give 1 tablet by mouth at bedtime for agitation and anxiety or depression.  Marland Kitchen morphine (ROXANOL) 20 MG/ML concentrated solution Take 0.125 ml by mouth every 4 hours as needed for pain not relieved by Hydrocodone  . morphine (ROXANOL) 20 MG/ML concentrated solution Take 0.25 ml by mouth as needed for pain not relieved by Hydrocodone  . Multiple Vitamins-Minerals (MULTI-VITAMIN/MINERALS PO) Take by mouth. Give 1 tablet by mouth once a day  . ondansetron (ZOFRAN) 4 MG tablet Take 4 mg by mouth every 8 (eight) hours as needed for nausea or vomiting.   . polyethylene glycol (MIRALAX / GLYCOLAX) packet Take 17 g by mouth daily.  . sertraline (ZOLOFT) 50 MG tablet Take 50 mg by mouth daily. Starting 09/06/2017  . [DISCONTINUED] morphine (ROXANOL) 20 MG/ML concentrated solution Take 0.25 mLs (5 mg total) by mouth every 4 (four) hours as needed for severe pain. Give 0.25 ml by mouth every 4 hours as needed for pain not relieved by Hydrocodone (Patient taking differently: Take 5 mg by mouth every 4 (four) hours as needed for severe pain. Give 0.125 ml by mouth every 4 hours as needed for pain not relieved by Hydrocodone)  . [DISCONTINUED] amLODipine (NORVASC) 2.5 MG tablet TAKE 1 TABLET BY MOUTH EVERY DAY  . [DISCONTINUED] carvedilol (COREG) 6.25 MG tablet Take 6.25 mg by mouth 2 (two) times daily with a meal.  . [DISCONTINUED] levothyroxine (SYNTHROID, LEVOTHROID) 150 MCG tablet Take 1.5 mcg tabs (225 mcg ) by mouth on Sun., Tues., Thurs., Sat.   No facility-administered encounter medications on file as of 10/01/2017.     Review of Systems   In general she is not complaining of fever chills think she has a decent appetite  although apparently she does not eat all that well.  Head ears eyes nose mouth and throat is not complaining of any sore throat or visual changes.  Respiratory does not complain of shortness of breath or cough.  Cardiac does not complain of chest pain.  GI is not complaining of abdominal discomfort nausea vomiting diarrhea constipation.  Musculoskeletal is not complaining of pain at this time apparently did complain of back pain last week  Neurologic is not complaining of headache dizziness does have increased weakness.    psych does not complain of being overtly anxious--Ativan appears to be effective  Immunization History  Administered Date(s) Administered  . Influenza Split 11/30/2012  . Influenza,inj,Quad PF,6+ Mos 11/30/2014, 01/09/2017  . Influenza-Unspecified 01/17/2012, 12/21/2013  . Pneumococcal Conjugate-13 10/08/2013  . Pneumococcal Polysaccharide-23 10/24/2015  . Td 03/20/2011   Pertinent  Health Maintenance Due  Topic Date Due  . FOOT EXAM  10/10/2017 (Originally 03/12/2017)  . OPHTHALMOLOGY EXAM  10/10/2017 (Originally 08/01/2017)  . URINE MICROALBUMIN  10/10/2017 (Originally 09/07/2015)  . INFLUENZA VACCINE  10/16/2017  . HEMOGLOBIN A1C  12/13/2017  . DEXA SCAN  Completed  . PNA vac Low Risk Adult  Completed   Fall Risk  06/24/2017 09/30/2016 06/05/2015 11/30/2014 07/06/2013  Falls in the past year? No No No No No  Risk for fall due to : Impaired balance/gait;Impaired mobility;History of fall(s);Medication side effect - - - -   Functional Status Survey:    Vitals:   10/01/17 1515  BP: (!) 170/65  Pulse: 73  Resp: 18  Temp: 98.5 F (36.9 C)  TempSrc: Oral  SpO2: 97%  Manual blood pressure was 148/60  Physical Exam In general this is a pleasant elderly female in no distress resting in bed-she does appear increasingly weak   her skin is warm and dry.    Oropharynx is clear mucous membranes moist.  Chest is clear to auscultation with somewhat shallow  air entry.  Heart is regular rate and rhythm without murmur gallop or rub she continues with moderate edema lower extremities bilaterally  Abdomen is somewhat obese soft nontender with positive bowel sounds  Musculoskeletal continues with left-sided weakness and also has some right sided weakness but does have some movement on the right side.  Neurologic as noted above she is alert  Psych-continues to be very pleasant and appropriate   Labs reviewed: Recent Labs    10/07/16 1119  08/03/17 0649 08/04/17 0529 07/17/2017 0436  08/25/17 0758 08/27/17 0741 09/01/17 0708  NA  --    < > 140 140 139   < > 140 139 142  K  --    < > 4.4 4.5 4.5   < > 4.8 5.1 4.9  CL  --    < > 106 108 109   < > 109 106 108  CO2  --    < > 24 24 24    < > 26 27 29   GLUCOSE  --    < > 90 97 151*   < > 98 114* 108*  BUN  --    < > 25* 20 24*   < > 47* 52* 47*  CREATININE  --    < > 0.97 0.91 0.93   < > 0.74 0.71 0.60  CALCIUM 10.2   < > 10.2 9.6 9.8   < > 9.7 9.8 10.0  MG 1.9  --  1.9 1.9 2.1  --   --   --   --   PHOS 3.0  --   --   --   --   --   --   --   --    < > = values in this interval not displayed.   Recent Labs    08/04/17 0529 07/25/2017 0436 08/14/17 0726  AST 16 18 26   ALT 9* 9* 26  ALKPHOS 57 64 59  BILITOT 0.5 0.5 0.7  PROT 6.8 7.4 6.5  ALBUMIN 3.1* 3.4* 3.2*   Recent Labs    07/24/2017 0436  08/18/17 0700 08/25/17 0758 09/01/17 0708  WBC 8.2   < > 26.1* 19.8* 21.8*  NEUTROABS 7.3  --  23.9*  --  19.5*  HGB 11.8*   < > 12.1 11.3* 11.5*  HCT 37.6   < > 37.1 34.2* 35.6*  MCV 97.7   < > 95.9 95.0 97.5  PLT 289   < > 145* 76* 98*   < > =  values in this interval not displayed.   Lab Results  Component Value Date   TSH 3.140 08/02/2017   Lab Results  Component Value Date   HGBA1C 5.3 06/12/2017   Lab Results  Component Value Date   CHOL 147 05/09/2016   HDL 36 (L) 05/09/2016   LDLCALC 92 05/09/2016   TRIG 95 05/09/2016   CHOLHDL 4.1 05/09/2016    Significant  Diagnostic Results in last 30 days:  No results found.  Assessment/Plan  #1 history of right parietal lobe malignant lesion- weakness continues to progress-she continues on Decadron  - she appears comfortable today on current Roxanol 2.5 mg every 4 hours as needed during the day for pain not relieved with the Norco scheduled every 4 hours 10-325 mg/tab.  She also has Roxanol 5 mg at night as needed   For anxiety continues on Ativan 0.25 mg once in the morning- 0.5 mg at bedtime   #2-history of partial seizures she is on Keppra and this does not appear to have been an issue during her stay here- apparently at one point had involuntary movements of her left lower leg thought possibly seizure-like  #3 hypertension blood pressure was somewhat low last week her Norvasc and Coreg were discontinued systolic in the 037Q when checked manually today- and at 170 earlier today I suspect this was by machine -at this point will monitor--if consistently elevated consider reinitiation of her hypertensives although would tend to be conservative here  CPT- [phone]

## 2017-10-02 ENCOUNTER — Encounter: Payer: Self-pay | Admitting: Internal Medicine

## 2017-10-02 ENCOUNTER — Non-Acute Institutional Stay (SKILLED_NURSING_FACILITY): Payer: Medicare Other | Admitting: Internal Medicine

## 2017-10-02 DIAGNOSIS — C713 Malignant neoplasm of parietal lobe: Secondary | ICD-10-CM | POA: Diagnosis not present

## 2017-10-02 DIAGNOSIS — I10 Essential (primary) hypertension: Secondary | ICD-10-CM | POA: Diagnosis not present

## 2017-10-02 DIAGNOSIS — E118 Type 2 diabetes mellitus with unspecified complications: Secondary | ICD-10-CM | POA: Diagnosis not present

## 2017-10-02 DIAGNOSIS — R531 Weakness: Secondary | ICD-10-CM | POA: Diagnosis not present

## 2017-10-02 DIAGNOSIS — G939 Disorder of brain, unspecified: Secondary | ICD-10-CM | POA: Diagnosis not present

## 2017-10-02 DIAGNOSIS — Z515 Encounter for palliative care: Secondary | ICD-10-CM | POA: Diagnosis not present

## 2017-10-02 DIAGNOSIS — E212 Other hyperparathyroidism: Secondary | ICD-10-CM | POA: Diagnosis not present

## 2017-10-02 DIAGNOSIS — E039 Hypothyroidism, unspecified: Secondary | ICD-10-CM | POA: Diagnosis not present

## 2017-10-02 NOTE — Progress Notes (Signed)
Location:   [facility] Room Number: 155/P Place of Service:  SNF [phone]) Provider:  Veleta Miners MD  Kathyrn Drown, MD  Patient Care Team: Kathyrn Drown, MD as PCP - General (Family Medicine)  Extended Emergency Contact Information Primary Emergency Contact: Evangelist,[name] Address: [address]          [place], [place] [address] [name] of [place] Phone: [phone] Mobile Phone: [phone] Relation: Spouse Secondary Emergency Contact: cliborne,kathy Home Phone: [phone] Relation: Sister  Code Status:  DNR Goals of care: Advanced Directive information Advanced Directives 10/02/2017  Does Patient Have a Medical Advance Directive? Yes  Type of Advance Directive (No Data)  Does patient want to make changes to medical advance directive? No - Patient declined  Copy of [place] in Chart? -  Would patient like information on creating a medical advance directive? No - Patient declined  Pre-existing out of facility DNR order (yellow form or pink MOST form) -     Chief Complaint  Patient presents with  . Acute Visit    Patient is being seen for End of Life Care    HPI:  Pt is a 84 y.o. female seen today for an acute visit for Pain Management Patient has h/o Hypertension, Hypercalcemia with Hyperparathyroidism  CKD disease Stage 3  Hypothyroid, And Macular Degeneration. 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 She has been evaluated by Neurosurgery and thought to be not candidate for any Aggressive therapy or Biopsy and family has decided her to be hospice.    Patient was seen today as family had requested Hospice Nurse to increase her Pain Meds and discontinue her other Meds. She has Deteriorated over last 24 hours. She is now not eating and is having difficulty in swallowing. She is also more Lethargic. Her Husband was in the room. He wants her to be peaceful and  comfortable.   Past Medical History:  Diagnosis Date  . Arthritis    Knees  . Chronic back pain   . Chronic pain of right knee   . Diabetes mellitus   . Gait abnormality   . Hypertension   . Macular degeneration   . Thyroid disease    Past Surgical History:  Procedure Laterality Date  . ABDOMINAL HYSTERECTOMY    . COLONOSCOPY    . SHOULDER SURGERY      Allergies  Allergen Reactions  . Aspirin   . Ibuprofen     Nausea or HTN  . Penicillins Rash    Has patient had a PCN reaction causing immediate rash, facial/tongue/throat swelling, SOB or lightheadedness with hypotension: unknown Has patient had a PCN reaction causing severe rash involving mucus membranes or skin necrosis: unknown Has patient had a PCN reaction that required hospitalization: no Has patient had a PCN reaction occurring within the last 10 years: No If all of the above answers are "NO", then may proceed with Cephalosporin use.     Outpatient Encounter Medications as of 10/02/2017  Medication Sig  . acetaminophen (TYLENOL) 650 MG CR tablet Take 650 mg by mouth every 8 (eight) hours as needed for pain.   Marland Kitchen dexamethasone (DECADRON) 4 MG tablet Take 4 mg by mouth 2 (two) times daily with a meal.  . HYDROcodone-acetaminophen (NORCO) 10-325 MG tablet Take 1 tablet by mouth every 4 (four) hours.  . levETIRAcetam (KEPPRA) 500 MG tablet Take 1 tablet (500 mg total) by mouth 2 (two)  times daily.  Marland Kitchen levothyroxine (SYNTHROID, LEVOTHROID) 150 MCG tablet Take 2 tablets on mon, wed, and Friday. Take one and a half tablets on tues, thurs, sat, and sun  . morphine (ROXANOL) 20 MG/ML concentrated solution Take 0.125 ml by mouth every 4 hours as needed for pain not relieved by Hydrocodone  . morphine (ROXANOL) 20 MG/ML concentrated solution Take 0.25 ml by mouth as needed for pain not relieved by Hydrocodone  . Multiple Vitamins-Minerals (MULTI-VITAMIN/MINERALS PO) Take by mouth. Give 1 tablet by mouth once a day  . ondansetron  (ZOFRAN) 4 MG tablet Take 4 mg by mouth every 8 (eight) hours as needed for nausea or vomiting.   . polyethylene glycol (MIRALAX / GLYCOLAX) packet Take 17 g by mouth daily.  . sertraline (ZOLOFT) 50 MG tablet Take 50 mg by mouth daily. Starting 09/06/2017  . [DISCONTINUED] LORazepam (ATIVAN) 0.5 MG tablet Give 1/2 tablet (0.25 mg ) by mouth every morning for persistent agitation and anxiety  . [DISCONTINUED] LORazepam (ATIVAN) 0.5 MG tablet Take 1 tablet (0.5 mg total) by mouth at bedtime. Give 1 tablet by mouth at bedtime for agitation and anxiety or depression.   No facility-administered encounter medications on file as of 10/02/2017.      Review of Systems  Unable to perform ROS: Acuity of condition    Immunization History  Administered Date(s) Administered  . Influenza Split 11/30/2012  . Influenza,inj,Quad PF,6+ Mos 11/30/2014, 01/09/2017  . Influenza-Unspecified 01/17/2012, 12/21/2013  . Pneumococcal Conjugate-13 10/08/2013  . Pneumococcal Polysaccharide-23 10/24/2015  . Td 03/20/2011   Pertinent  Health Maintenance Due  Topic Date Due  . FOOT EXAM  10/10/2017 (Originally 03/12/2017)  . OPHTHALMOLOGY EXAM  10/10/2017 (Originally 08/01/2017)  . URINE MICROALBUMIN  10/10/2017 (Originally 09/07/2015)  . INFLUENZA VACCINE  10/16/2017  . HEMOGLOBIN A1C  12/13/2017  . DEXA SCAN  Completed  . PNA vac Low Risk Adult  Completed   Fall Risk  06/24/2017 09/30/2016 06/05/2015 11/30/2014 07/06/2013  Falls in the past year? No No No No No  Risk for fall due to : Impaired balance/gait;Impaired mobility;History of fall(s);Medication side effect - - - -   Functional Status Survey:    Vitals:   10/02/17 1009  BP: (!) 148/65  Pulse: 76  Resp: 18  Temp: 98.9 F (37.2 C)   There is no height or weight on file to calculate BMI. Physical Exam  Constitutional: She appears well-developed.  HENT:  Head: Normocephalic.  Mouth/Throat: Oropharynx is clear and moist.  Eyes: Pupils are equal,  round, and reactive to light.  Neck: Neck supple.  Cardiovascular: Normal rate and regular rhythm.  Pulmonary/Chest: Effort normal and breath sounds normal.  Abdominal: Soft. Bowel sounds are normal.  Musculoskeletal:  Moderate Edema Bilateral  Neurological:  Patient more Lethargic and sleepy . Not Moving her Left Extremity. Just has some movement in Right UE   Skin: Skin is warm and dry.    Labs reviewed: Recent Labs    10/07/16 1119  08/03/17 0649 08/04/17 0529 08/10/2017 0436  08/25/17 0758 08/27/17 0741 09/01/17 0708  NA  --    < > 140 140 139   < > 140 139 142  K  --    < > 4.4 4.5 4.5   < > 4.8 5.1 4.9  CL  --    < > 106 108 109   < > 109 106 108  CO2  --    < > 24 24 24    < > 26  27 29  GLUCOSE  --    < > 90 97 151*   < > 98 114* 108*  BUN  --    < > 25* 20 24*   < > 47* 52* 47*  CREATININE  --    < > 0.97 0.91 0.93   < > 0.74 0.71 0.60  CALCIUM 10.2   < > 10.2 9.6 9.8   < > 9.7 9.8 10.0  MG 1.9  --  1.9 1.9 2.1  --   --   --   --   PHOS 3.0  --   --   --   --   --   --   --   --    < > = values in this interval not displayed.   Recent Labs    08/04/17 0529 07/21/2017 0436 08/14/17 0726  AST 16 18 26   ALT 9* 9* 26  ALKPHOS 57 64 59  BILITOT 0.5 0.5 0.7  PROT 6.8 7.4 6.5  ALBUMIN 3.1* 3.4* 3.2*   Recent Labs    07/17/2017 0436  08/18/17 0700 08/25/17 0758 09/01/17 0708  WBC 8.2   < > 26.1* 19.8* 21.8*  NEUTROABS 7.3  --  23.9*  --  19.5*  HGB 11.8*   < > 12.1 11.3* 11.5*  HCT 37.6   < > 37.1 34.2* 35.6*  MCV 97.7   < > 95.9 95.0 97.5  PLT 289   < > 145* 76* 98*   < > = values in this interval not displayed.   Lab Results  Component Value Date   TSH 3.140 08/02/2017   Lab Results  Component Value Date   HGBA1C 5.3 06/12/2017   Lab Results  Component Value Date   CHOL 147 05/09/2016   HDL 36 (L) 05/09/2016   LDLCALC 92 05/09/2016   TRIG 95 05/09/2016   CHOLHDL 4.1 05/09/2016    Significant Diagnostic Results in last 30 days:  No results  found.  Assessment/Plan Malignant Lesion of Right Parietal Lobe and Now End of Life Care Patient Hospice Care Worsening Left Side weakness and Right Sided weakness Will start her on Schedule Q4 hour Roxanol. And Schedule Q4 Hour Ativan Discontinue her Keppra and steroids and also Levpthyroxine       Family/ staff Communication:   Labs/tests ordered:

## 2017-10-03 ENCOUNTER — Other Ambulatory Visit: Payer: Self-pay

## 2017-10-03 DIAGNOSIS — E212 Other hyperparathyroidism: Secondary | ICD-10-CM | POA: Diagnosis not present

## 2017-10-03 DIAGNOSIS — C713 Malignant neoplasm of parietal lobe: Secondary | ICD-10-CM | POA: Diagnosis not present

## 2017-10-03 DIAGNOSIS — R531 Weakness: Secondary | ICD-10-CM | POA: Diagnosis not present

## 2017-10-03 DIAGNOSIS — E039 Hypothyroidism, unspecified: Secondary | ICD-10-CM | POA: Diagnosis not present

## 2017-10-03 DIAGNOSIS — E118 Type 2 diabetes mellitus with unspecified complications: Secondary | ICD-10-CM | POA: Diagnosis not present

## 2017-10-03 DIAGNOSIS — I10 Essential (primary) hypertension: Secondary | ICD-10-CM | POA: Diagnosis not present

## 2017-10-03 MED ORDER — LORAZEPAM 0.5 MG PO TABS: 0.5000 mg | ORAL_TABLET | ORAL | 0 refills | Status: AC

## 2017-10-03 NOTE — Telephone Encounter (Signed)
RX Fax for [facility]@ [phone]  

## 2017-10-06 DIAGNOSIS — E039 Hypothyroidism, unspecified: Secondary | ICD-10-CM | POA: Diagnosis not present

## 2017-10-06 DIAGNOSIS — I10 Essential (primary) hypertension: Secondary | ICD-10-CM | POA: Diagnosis not present

## 2017-10-06 DIAGNOSIS — C713 Malignant neoplasm of parietal lobe: Secondary | ICD-10-CM | POA: Diagnosis not present

## 2017-10-06 DIAGNOSIS — R531 Weakness: Secondary | ICD-10-CM | POA: Diagnosis not present

## 2017-10-06 DIAGNOSIS — E118 Type 2 diabetes mellitus with unspecified complications: Secondary | ICD-10-CM | POA: Diagnosis not present

## 2017-10-06 DIAGNOSIS — E212 Other hyperparathyroidism: Secondary | ICD-10-CM | POA: Diagnosis not present

## 2017-10-07 ENCOUNTER — Encounter: Payer: Self-pay | Admitting: Internal Medicine

## 2017-10-07 DIAGNOSIS — I10 Essential (primary) hypertension: Secondary | ICD-10-CM | POA: Diagnosis not present

## 2017-10-07 DIAGNOSIS — E118 Type 2 diabetes mellitus with unspecified complications: Secondary | ICD-10-CM | POA: Diagnosis not present

## 2017-10-07 DIAGNOSIS — E039 Hypothyroidism, unspecified: Secondary | ICD-10-CM | POA: Diagnosis not present

## 2017-10-07 DIAGNOSIS — E212 Other hyperparathyroidism: Secondary | ICD-10-CM | POA: Diagnosis not present

## 2017-10-07 DIAGNOSIS — R531 Weakness: Secondary | ICD-10-CM | POA: Diagnosis not present

## 2017-10-07 DIAGNOSIS — C713 Malignant neoplasm of parietal lobe: Secondary | ICD-10-CM | POA: Diagnosis not present

## 2017-10-12 ENCOUNTER — Other Ambulatory Visit: Payer: Self-pay | Admitting: Family Medicine

## 2017-10-16 DEATH — deceased

## 2018-01-05 ENCOUNTER — Ambulatory Visit: Payer: Medicare Other | Admitting: "Endocrinology

## 2019-11-01 IMAGING — DX DG KNEE COMPLETE 4+V*R*
4 series · 4 of 4 positions shown · non-contrast
Comparison: None.

CLINICAL DATA: Fall in bathroom with right knee pain.

EXAM:
RIGHT KNEE - COMPLETE 4+ VIEW

[knee ap (1 of 3)]
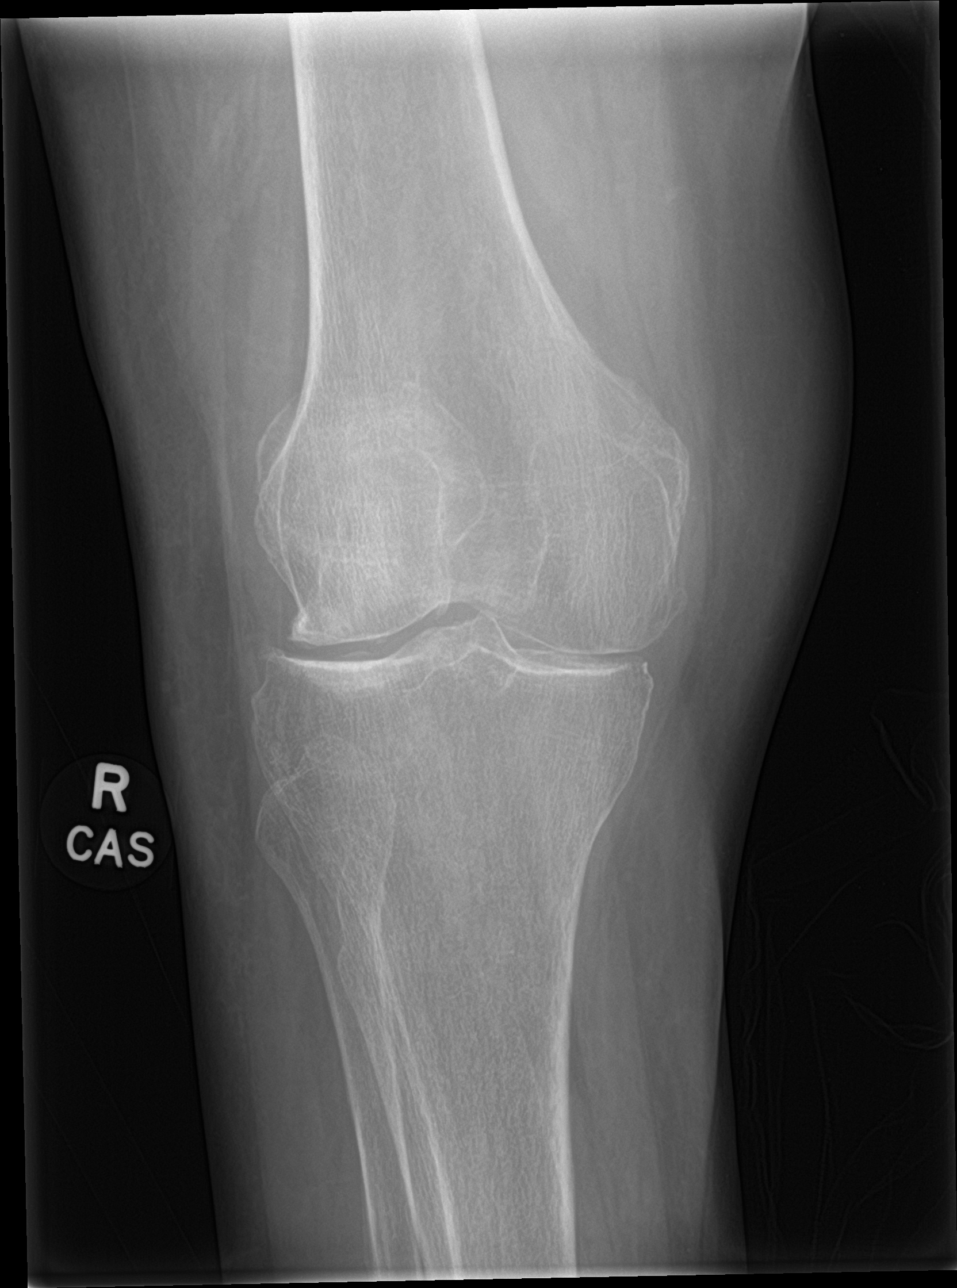

[knee lat]
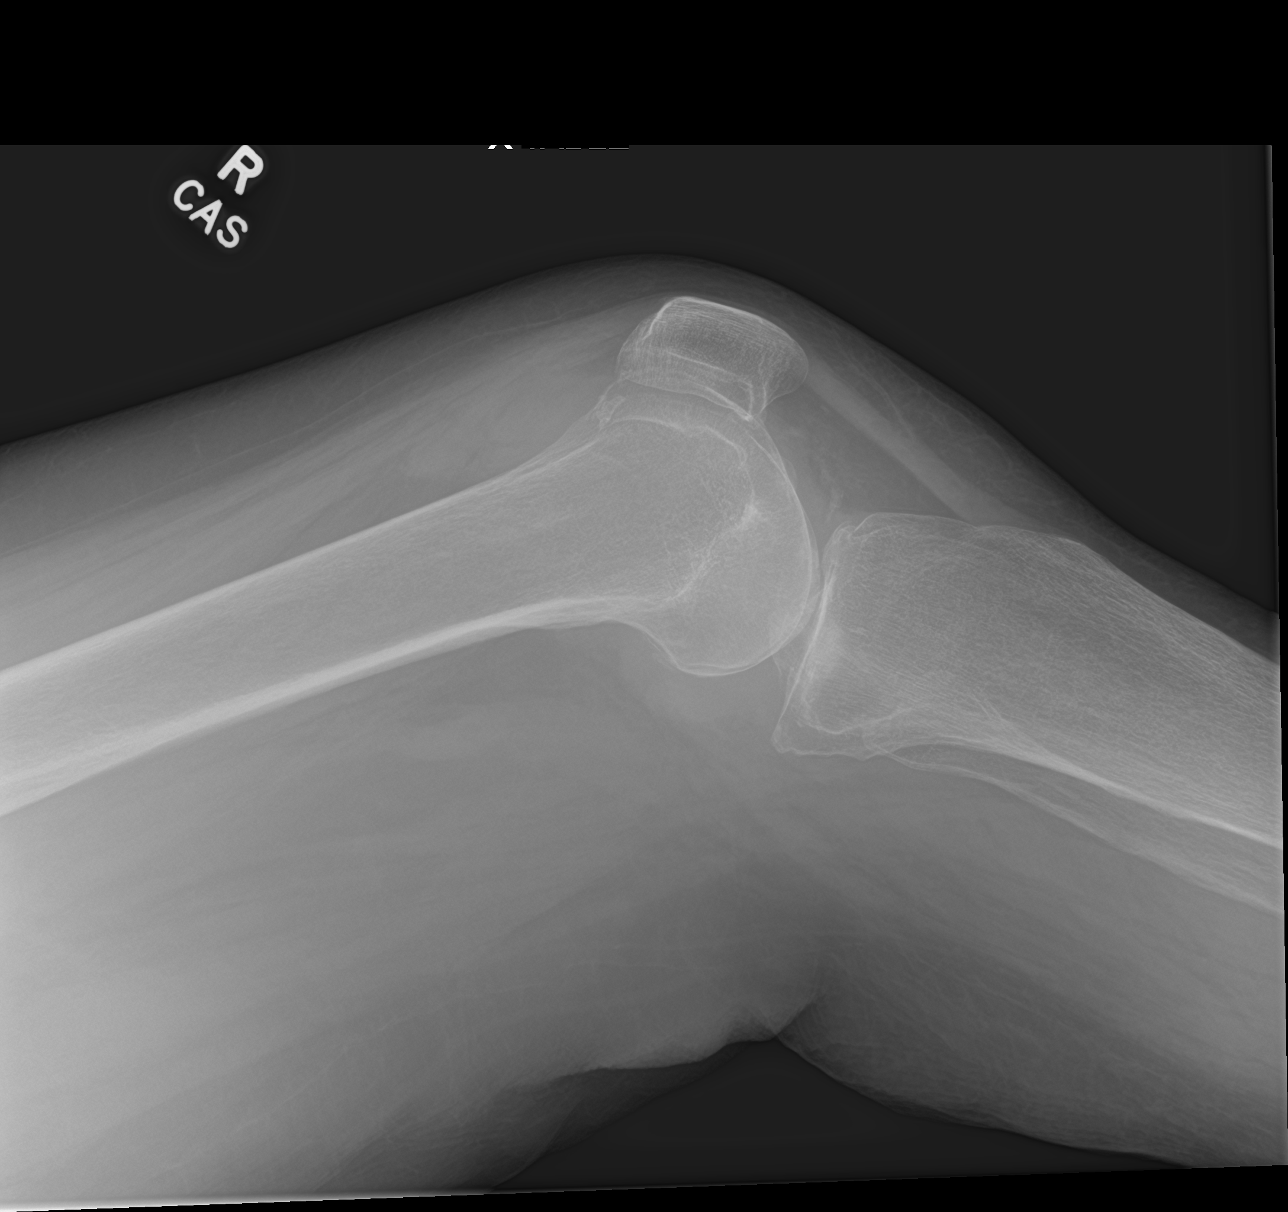

[knee ap (2 of 3)]
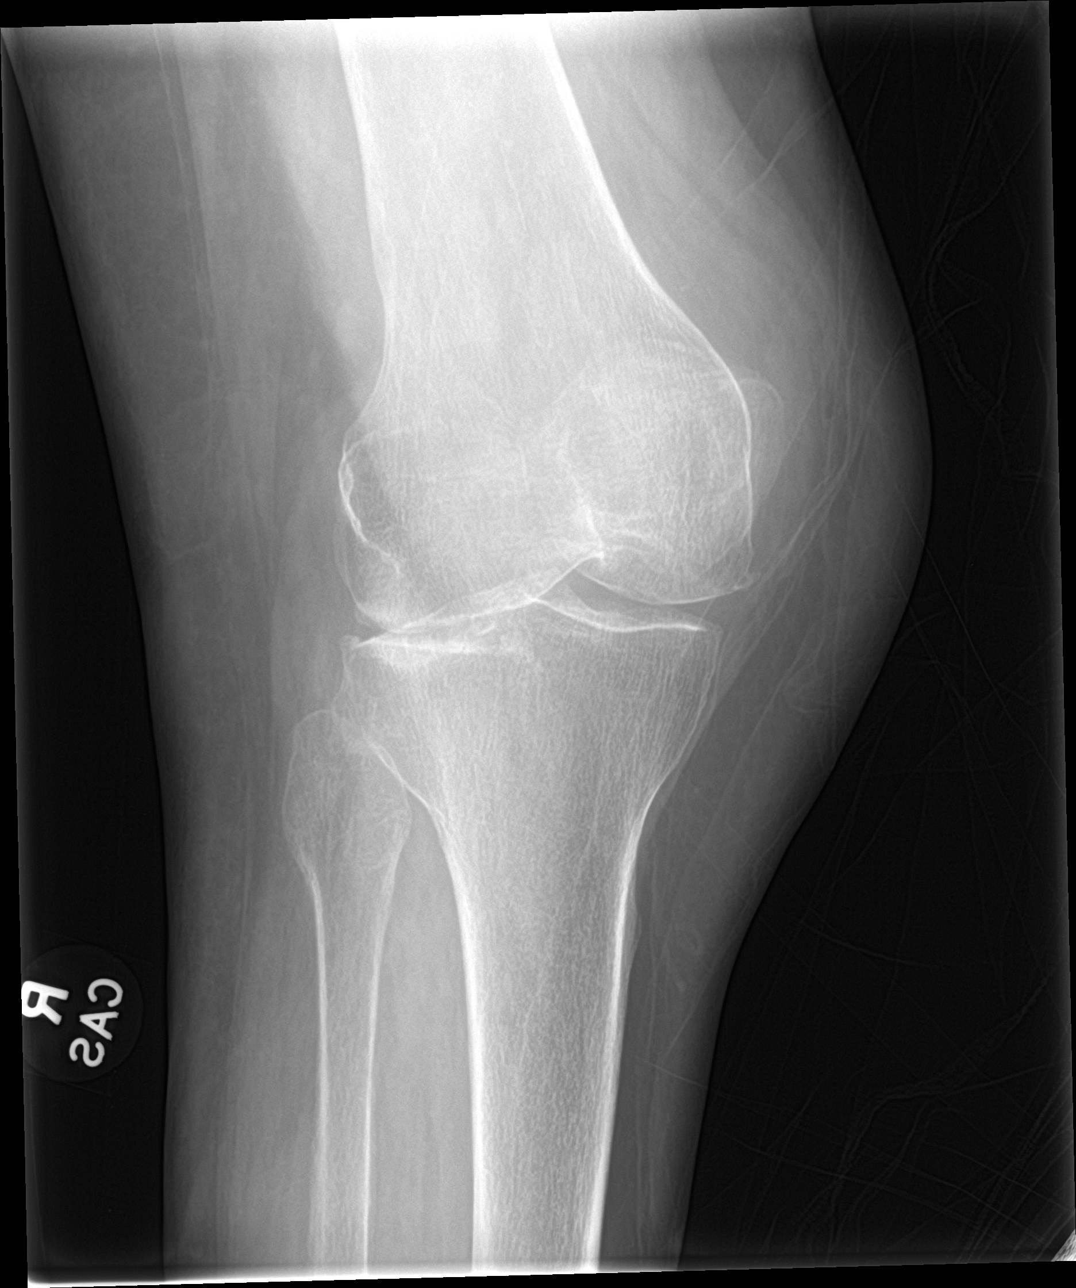

[knee ap (3 of 3)]
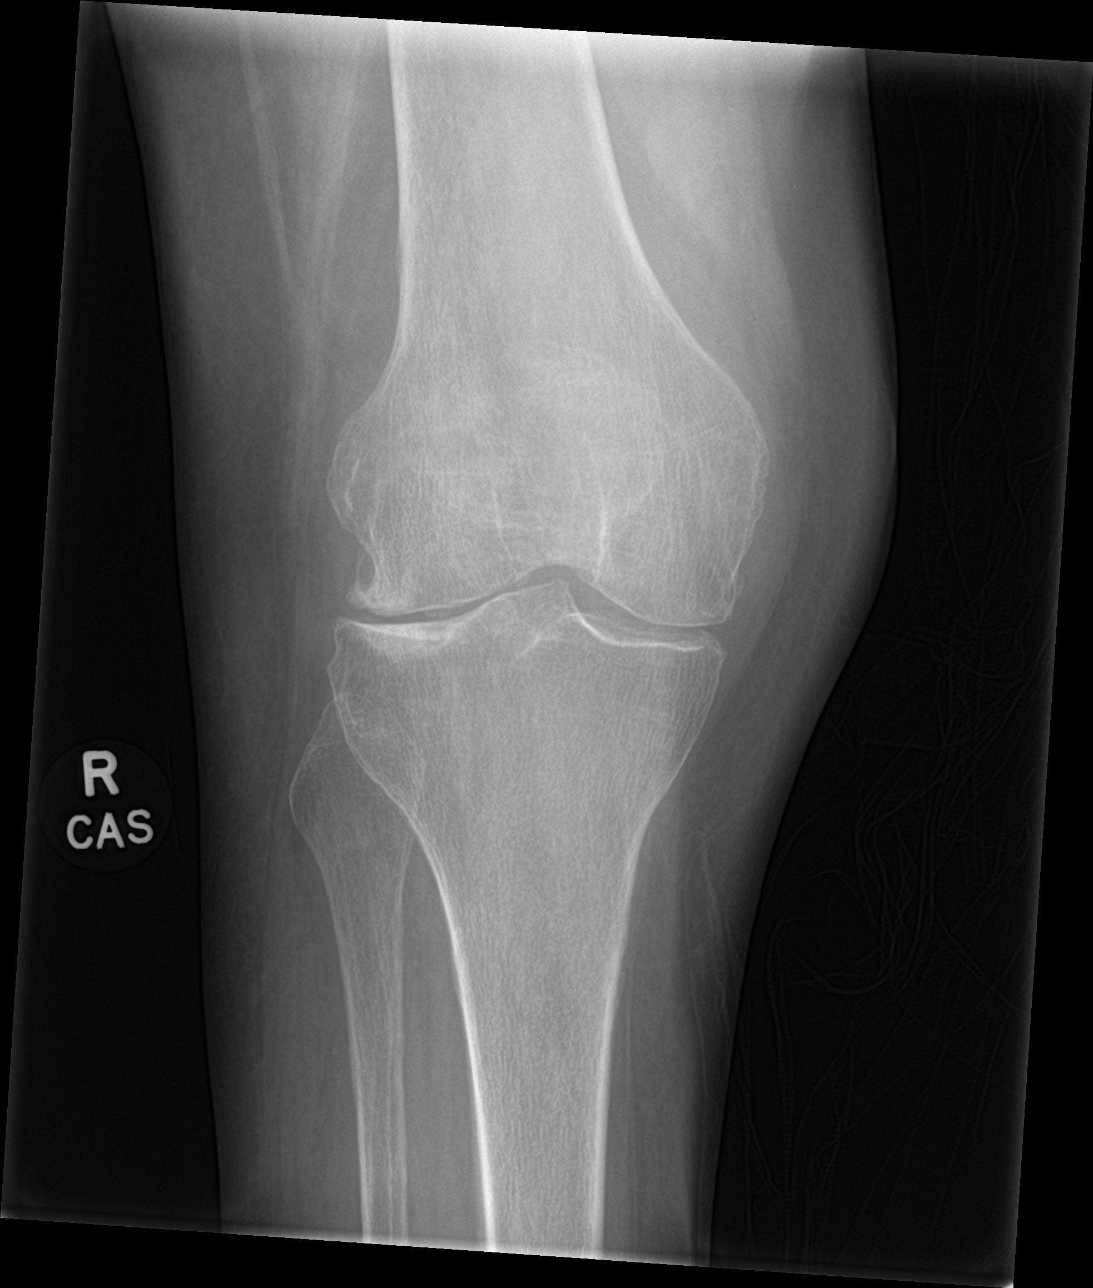

[4 of 4 positions shown; findings below may reference images not displayed]

FINDINGS: Mild diffuse osteopenia. Mild tricompartmental osteoarthritic change
worse over the lateral compartment. No evidence of acute fracture or
dislocation. Small joint effusion is present.
IMPRESSION: No acute fracture.

Small joint effusion.

Mild tricompartmental osteoarthritis.

## 2019-11-01 IMAGING — CT CT ABD-PELV W/ CM
2 of 5 series · 12 of 36 positions shown, 15 images · IV contrast (Isovue)
Comparison: 08/02/2017 head CT, chest radiograph 05/28/2017

CLINICAL DATA: Initial workup due to presumed CNS metastasis.

EXAM:
CT CHEST, ABDOMEN, AND PELVIS WITH CONTRAST
TECHNIQUE: Multidetector CT imaging of the chest, abdomen and pelvis was
performed following the standard protocol during bolus
administration of intravenous contrast.
CONTRAST:  75 cc 97GRDQ-8II IOPAMIDOL (97GRDQ-8II) INJECTION 61%,
30mL 97GRDQ-8II IOPAMIDOL (97GRDQ-8II) INJECTION 61%

[Series 2: cap with · axial · 0.79mm/px · z∈[+699,+1219]mm · 9 of 128 slices shown, 12 images]
[im 12/128  mediastinal]
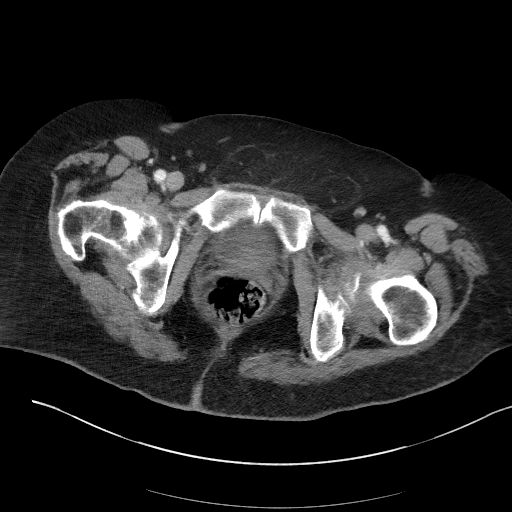
[im 12/128  lung]
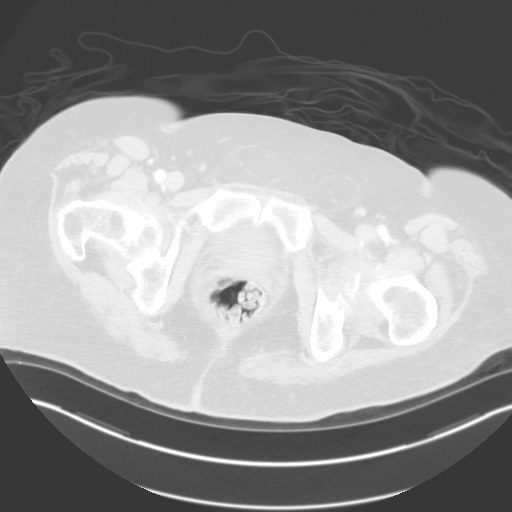
[im 24/128  lung]
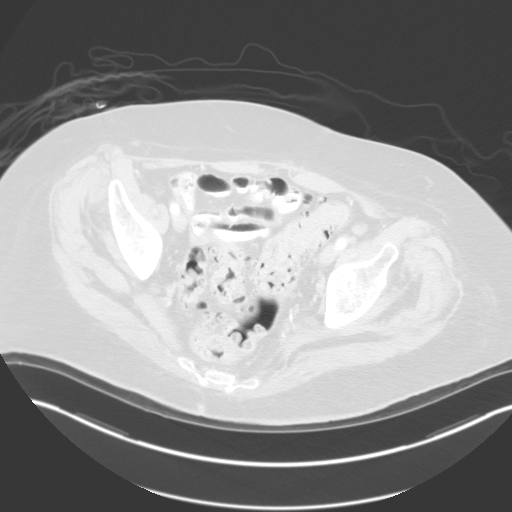
[im 35/128  lung]
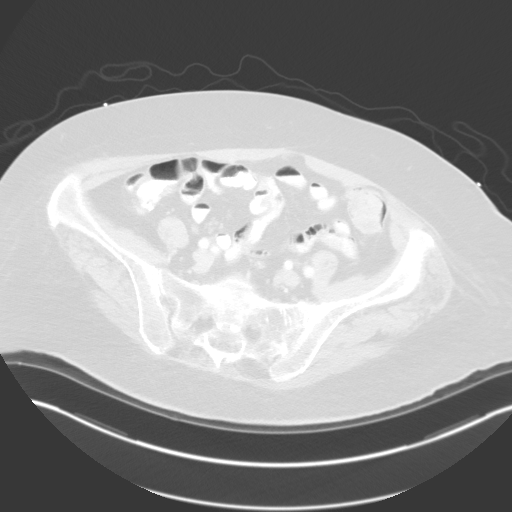
[im 47/128  lung]
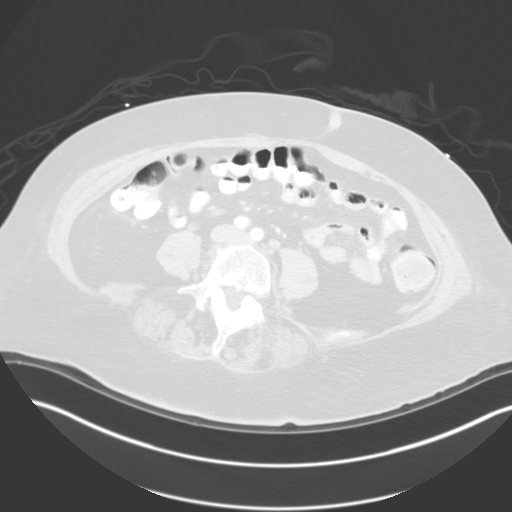
[im 70/128  mediastinal]
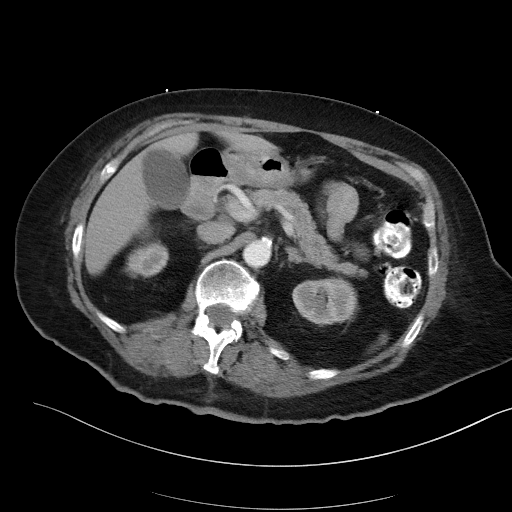
[im 70/128  lung]
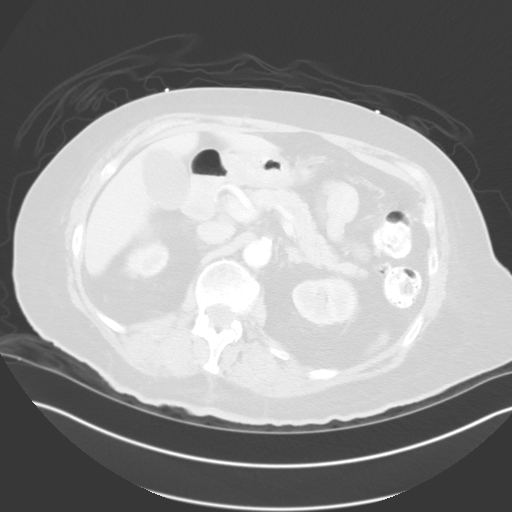
[im 81/128  lung]
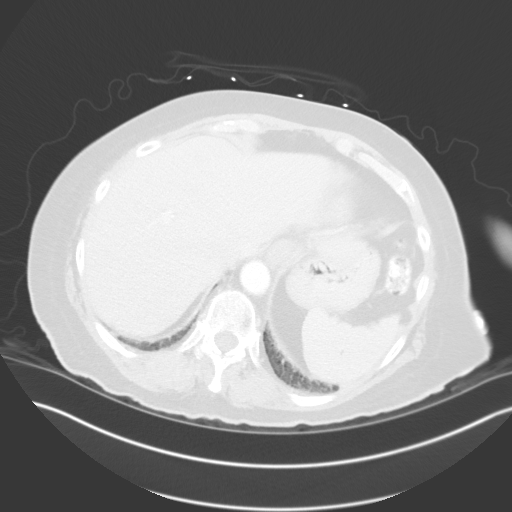
[im 93/128  lung]
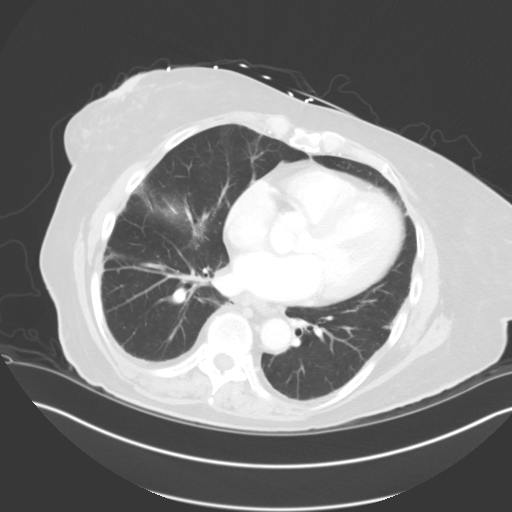
[im 104/128  lung]
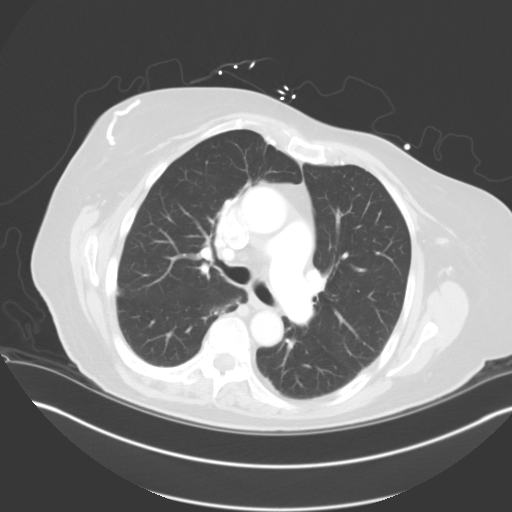
[im 116/128  mediastinal]
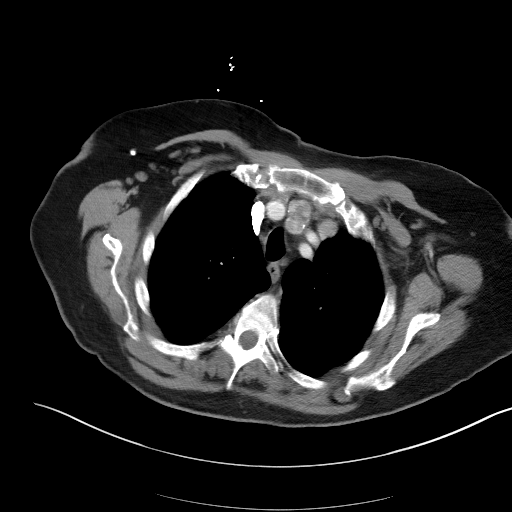
[im 116/128  lung]
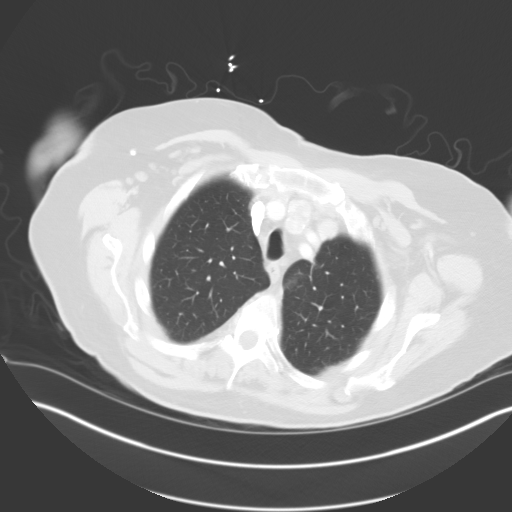

[Series 4: coronals · coronal · 0.82mm/px · 3 of 130 slices shown]
[im 26/130  lung]
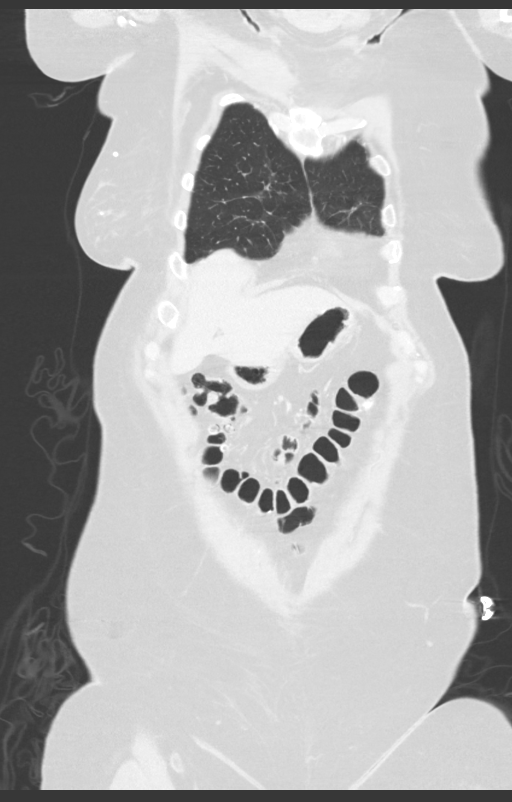
[im 52/130  lung]
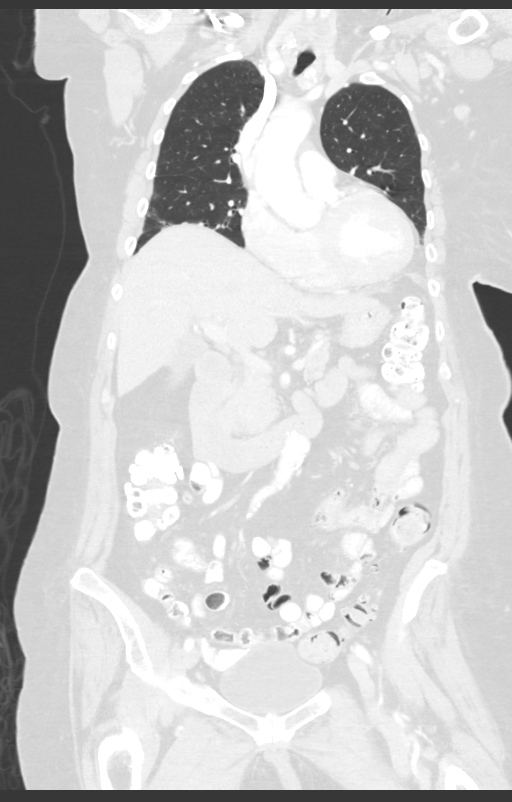
[im 78/130  lung]
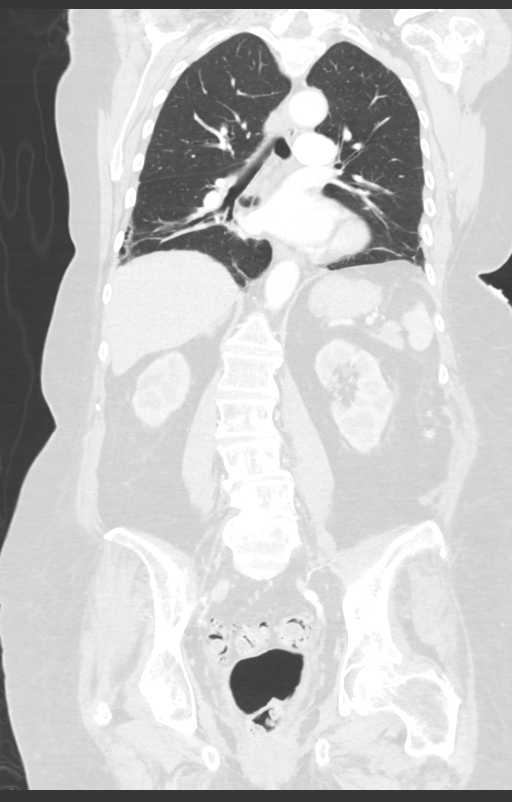

[12 of 36 positions shown; findings below may reference images not displayed]

FINDINGS: CT CHEST FINDINGS

Cardiovascular: Retroesophageal course of the internal carotid
arteries bilaterally. Conventional branch pattern of the great
vessels without significant stenosis. Ectatic ascending thoracic
aorta to 3.7 cm. No thoracic aortic dissection. Minimal coronary
arteriosclerosis along left main and proximal LAD. No acute
pulmonary embolus to the segmental level. Heart size is borderline
enlarged without pericardial effusion.

Mediastinum/Nodes: Thyroid goiter with coarse calcifications within
the left lobe. Subcentimeter cystic nodules are identified the lower
poles of both lobes. Patent midline trachea and mainstem bronchi. No
mediastinal hilar lymphadenopathy.

Lungs/Pleura: No dominant mass or pulmonary consolidation.
Multilobar bilateral subpleural areas of atelectasis and/or
scarring. Mild peribronchial thickening within both lower lobes with
subpleural areas of interstitial prominence and/or fibrosis in the
lower lobes bilaterally. Small paraseptal foci of emphysema along
the periphery of the right middle and both lower lobes. No effusion
or pneumothorax.

Musculoskeletal: Subchondral cystic change of both humeral heads and
glenoid fossae. No aggressive osteolytic or blastic disease.
Degenerative disc disease T7 through T10.

CT ABDOMEN PELVIS FINDINGS

Hepatobiliary: The liver enhances homogeneously. The gallbladder is
physiologically distended without calculus. No biliary dilatation
noted.

Pancreas: No enhancing pancreatic mass or ductal dilatation. No
inflammation. There appears to be mild side branch ductal ectasia of
the pancreatic tail. Tiny side-branch intraductal papillary mucinous
neoplasms are not entirely excluded, series [DATE].

Spleen: Normal size.  No enhancing mass.

Adrenals/Urinary Tract: Normal bilateral adrenal glands. 2.2 cm
upper pole right renal cyst, simple in appearance with smaller
subcentimeter hypodense lesions of both kidneys statistically
consistent with cysts in the interpolar aspect of both kidneys and
right lower pole. No nephrolithiasis, enhancing renal mass nor
obstructive uropathy. The urinary bladder demonstrates no calculus
or focal mural thickening. No focal mass.

Stomach/Bowel: Small hiatal hernia. Decompressed stomach. Normal
small bowel rotation is noted. No small-bowel dilatation or mass. No
inflammation. The distal terminal ileum are unremarkable. No
findings of acute appendicitis. Scattered colonic diverticulosis
without acute diverticulitis. No annular constricting lesions.
Moderate fecal retention within the descending and sigmoid colon.

Vascular/Lymphatic: Mild-to-moderate aortic atherosclerosis. No
aneurysm or adenopathy.

Reproductive: Hysterectomy.  No adnexal mass.

Other: No free air nor free fluid.

Musculoskeletal: Degenerative disc disease L4-5. No aggressive
osteolytic or blastic disease. Multilevel degenerative facet
arthropathy of the lumbar spine. Sclerosis about the pubic symphysis
and both SI joints. Lumbosacral transitional vertebral anatomy with
pseudoarticulation of L5 with S1 on the left.
IMPRESSION: Chest CT:

1. Multinodular goiter.
2. Coronary arteriosclerosis and aortic atherosclerosis. No aneurysm
or acute pulmonary embolus.
3. No pulmonary lesions.
4. Minimal subpleural areas of bilateral atelectasis and/or
fibrosis.

CT AP:

1. Tiny cystic foci in the pancreatic tail may represent ectatic
pancreatic duct side branches. Side branch IPMN might also be a
possibility.
2. Bilateral renal cysts, the largest measuring 2.2 cm in the upper
pole. The remainder are too small to further characterize in the
interpolar aspect of both kidneys and right lower pole.
3. Scattered colonic diverticulosis without acute diverticulitis.
Masslike abnormality identified.

## 2019-11-01 IMAGING — DX DG ANKLE COMPLETE 3+V*R*
3 series · 3 of 3 positions shown · non-contrast
Comparison: 07/22/2017

CLINICAL DATA: Fall in bathroom with right ankle pain.

EXAM:
RIGHT ANKLE - COMPLETE 3+ VIEW

[ankle ap]
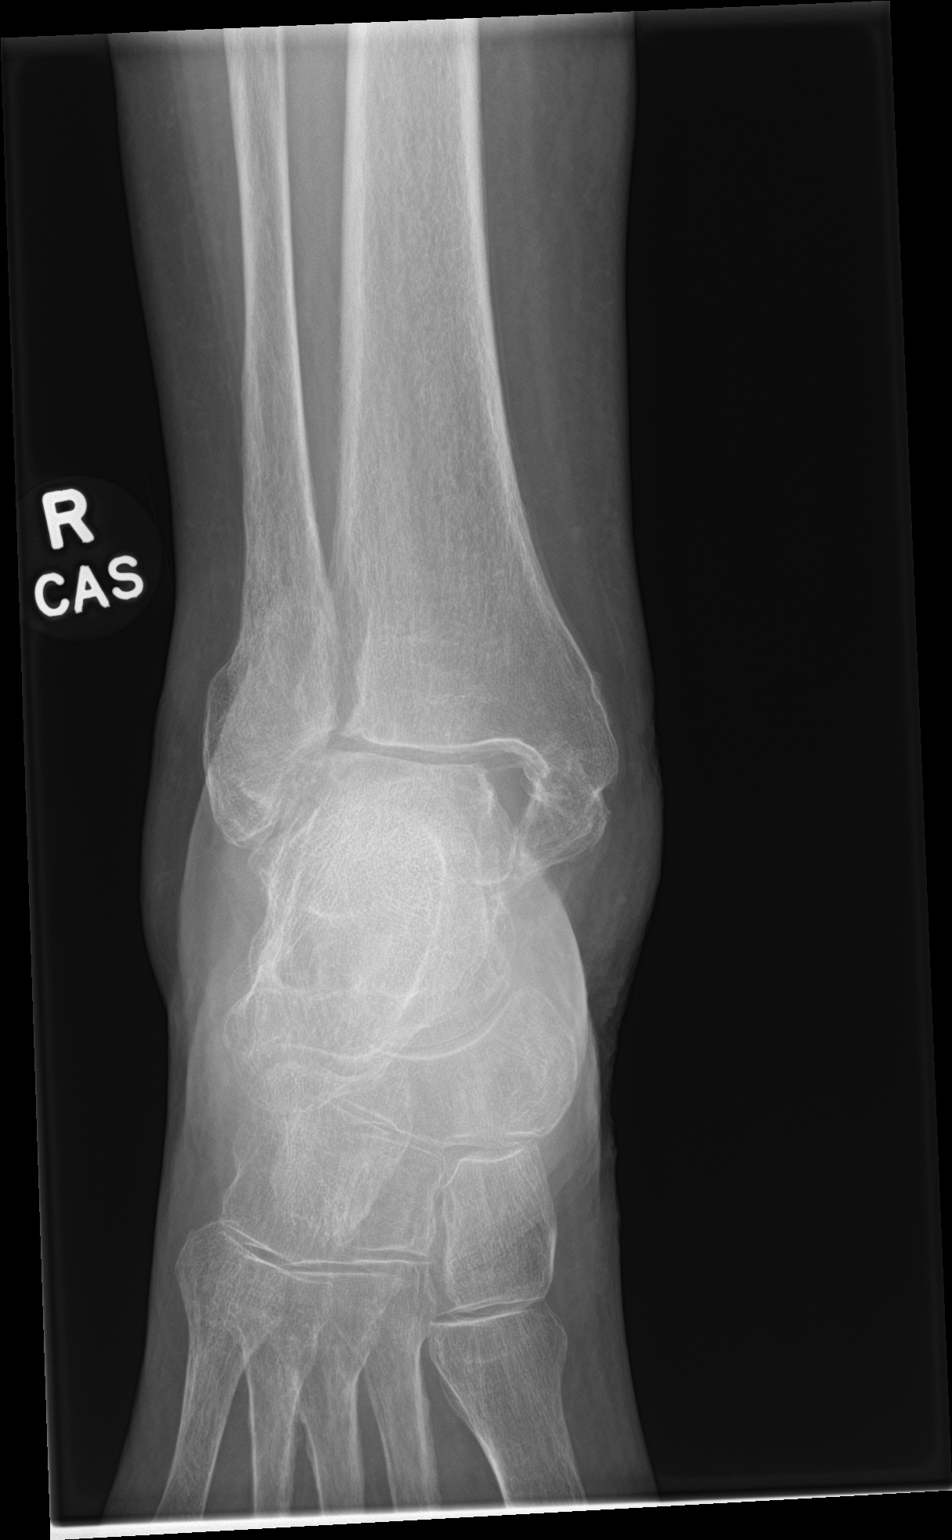

[ankle obl]
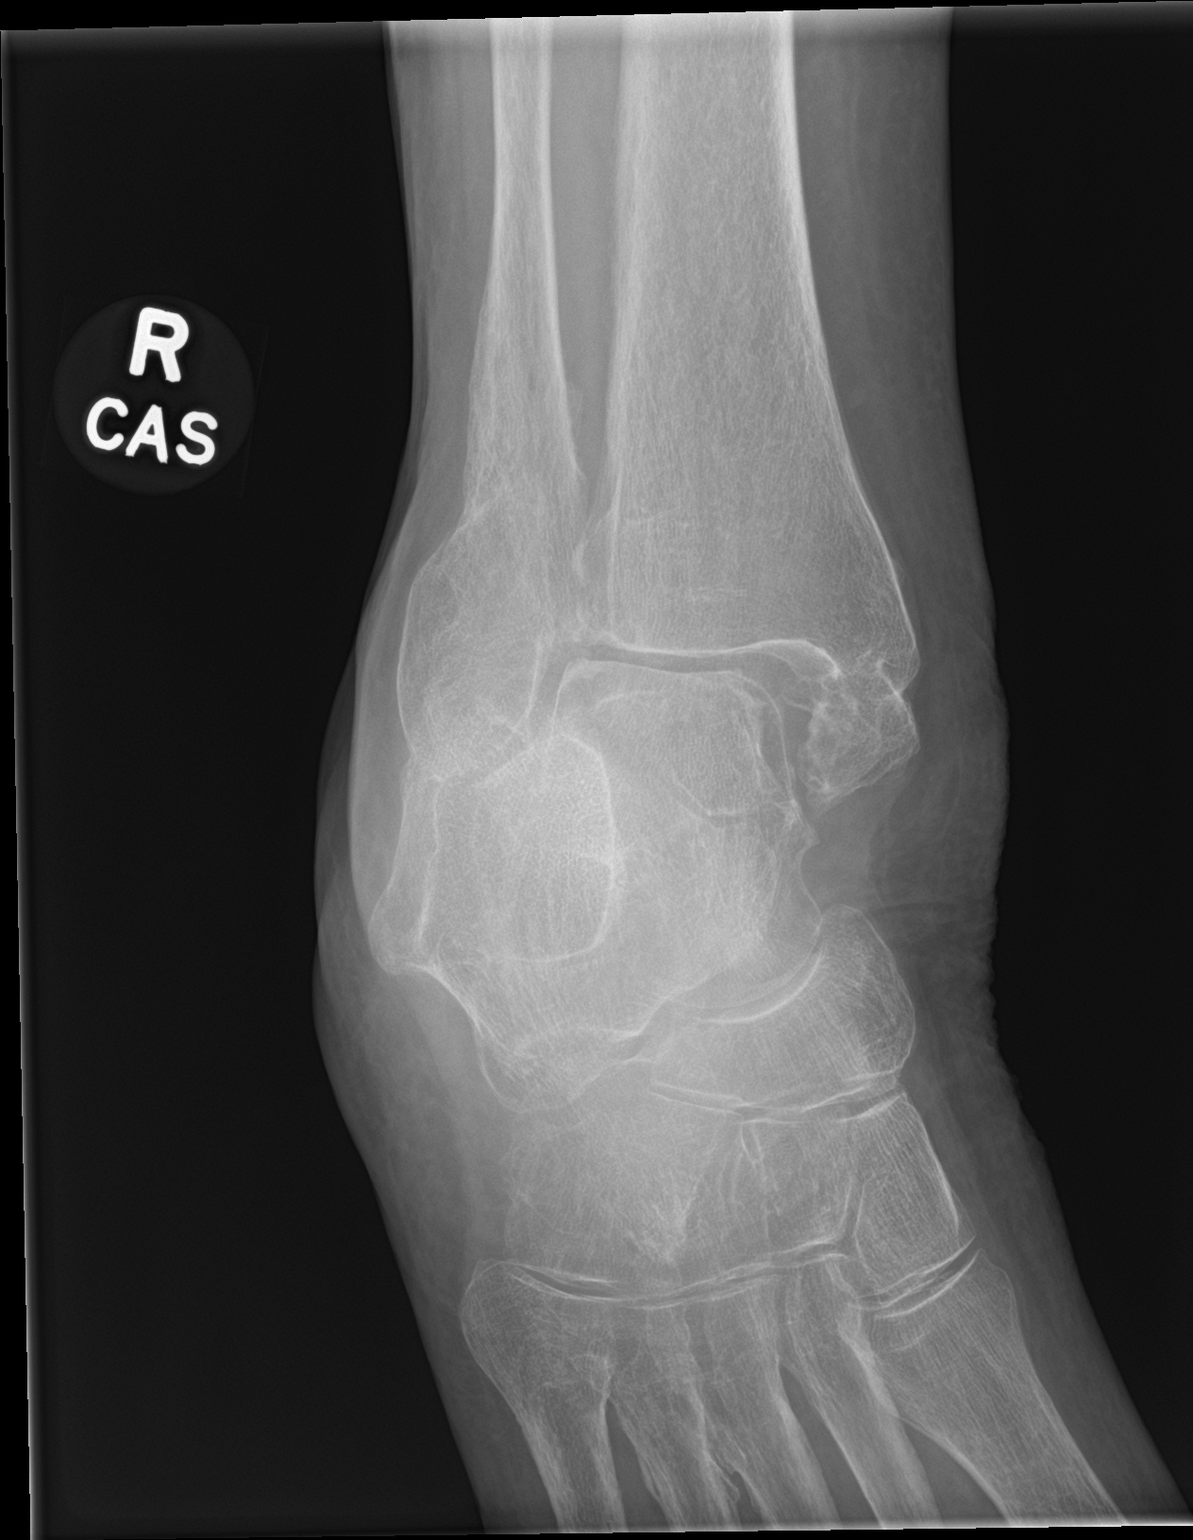

[ankle lat]
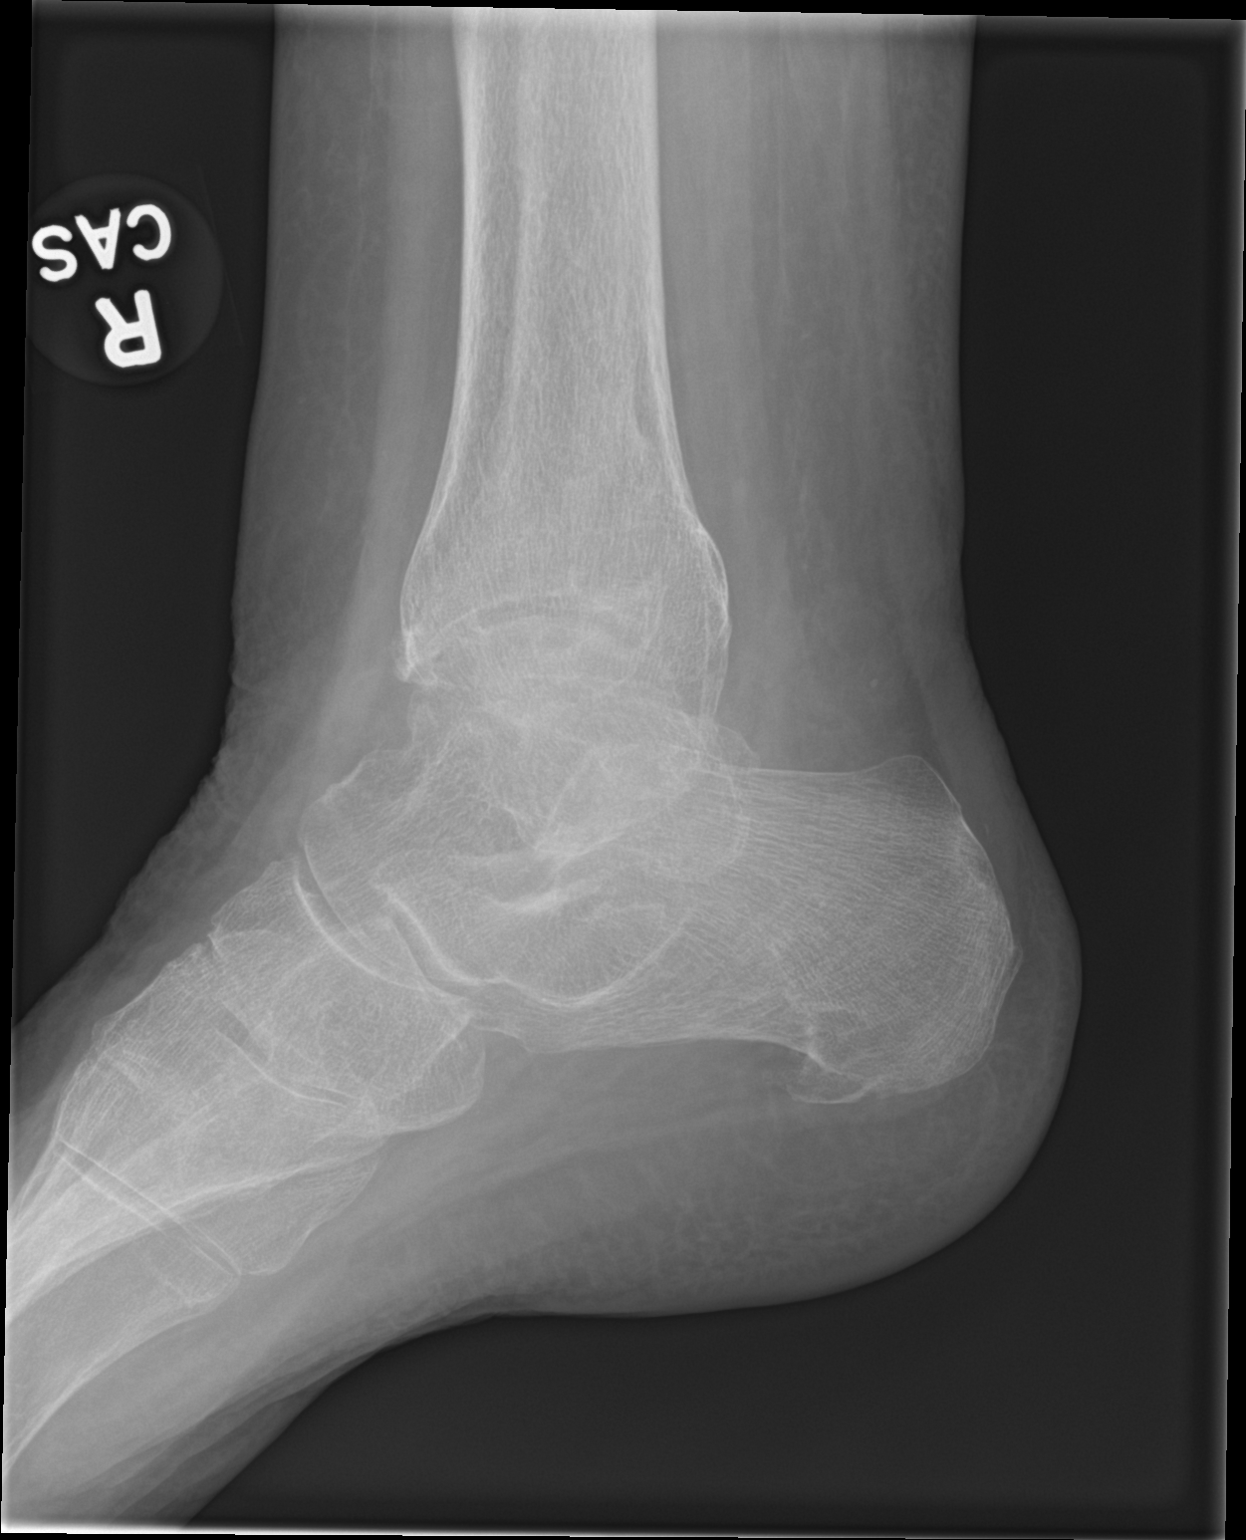

[3 of 3 positions shown; findings below may reference images not displayed]

FINDINGS: Examination demonstrates minimal soft tissue swelling over the
ankle. No evidence of acute fracture or dislocation. Old healed
distal fibular and medial malleolar fractures. Slight stable
widening of the medial aspect of the ankle mortise. Small inferior
calcaneal spur.
IMPRESSION: No acute fracture.

Old bimalleolar fractures and stable widening of the medial aspect
of the ankle mortise.
